# Patient Record
Sex: Female | Born: 1965 | Race: Black or African American | Hispanic: No | Smoking: Never smoker
Health system: Southern US, Community
[De-identification: ages and names within clinical notes are randomized; demographics above are authoritative.]

## PROBLEM LIST (undated history)

## (undated) DIAGNOSIS — D649 Anemia, unspecified: Secondary | ICD-10-CM

## (undated) DIAGNOSIS — IMO0002 Reserved for concepts with insufficient information to code with codable children: Secondary | ICD-10-CM

## (undated) DIAGNOSIS — J45909 Unspecified asthma, uncomplicated: Secondary | ICD-10-CM

## (undated) DIAGNOSIS — M069 Rheumatoid arthritis, unspecified: Secondary | ICD-10-CM

## (undated) DIAGNOSIS — T7840XA Allergy, unspecified, initial encounter: Secondary | ICD-10-CM

## (undated) DIAGNOSIS — M329 Systemic lupus erythematosus, unspecified: Secondary | ICD-10-CM

## (undated) DIAGNOSIS — G500 Trigeminal neuralgia: Secondary | ICD-10-CM

## (undated) DIAGNOSIS — Z5189 Encounter for other specified aftercare: Secondary | ICD-10-CM

## (undated) DIAGNOSIS — I219 Acute myocardial infarction, unspecified: Secondary | ICD-10-CM

## (undated) DIAGNOSIS — I82409 Acute embolism and thrombosis of unspecified deep veins of unspecified lower extremity: Secondary | ICD-10-CM

## (undated) HISTORY — DX: Encounter for other specified aftercare: Z51.89

## (undated) HISTORY — DX: Trigeminal neuralgia: G50.0

## (undated) HISTORY — DX: Allergy, unspecified, initial encounter: T78.40XA

## (undated) HISTORY — DX: Rheumatoid arthritis, unspecified: M06.9

## (undated) HISTORY — PX: ROTATOR CUFF REPAIR: SHX139

## (undated) HISTORY — PX: STOMACH SURGERY: SHX791

## (undated) HISTORY — DX: Anemia, unspecified: D64.9

## (undated) HISTORY — PX: NERVE AND TENDON REPAIR: SHX5693

## (undated) HISTORY — PX: COLONOSCOPY: SHX174

## (undated) HISTORY — DX: Acute myocardial infarction, unspecified: I21.9

## (undated) HISTORY — DX: Unspecified asthma, uncomplicated: J45.909

## (undated) HISTORY — DX: Systemic lupus erythematosus, unspecified: M32.9

## (undated) HISTORY — PX: ABDOMINAL HYSTERECTOMY: SHX81

## (undated) HISTORY — DX: Reserved for concepts with insufficient information to code with codable children: IMO0002

---

## 2013-06-24 ENCOUNTER — Ambulatory Visit: Payer: 59 | Admitting: Internal Medicine

## 2013-06-24 VITALS — BP 112/72 | HR 77 | Temp 98.9°F | Resp 18 | Ht 59.5 in | Wt 195.2 lb

## 2013-06-24 DIAGNOSIS — N760 Acute vaginitis: Secondary | ICD-10-CM

## 2013-06-24 DIAGNOSIS — M329 Systemic lupus erythematosus, unspecified: Secondary | ICD-10-CM | POA: Insufficient documentation

## 2013-06-24 LAB — POCT WET PREP WITH KOH
KOH Prep POC: POSITIVE
Trichomonas, UA: NEGATIVE
Yeast Wet Prep HPF POC: POSITIVE

## 2013-06-24 MED ORDER — FLUCONAZOLE 150 MG PO TABS: 150.0000 mg | ORAL_TABLET | Freq: Once | ORAL | Status: DC

## 2013-06-24 NOTE — Progress Notes (Addendum)
°  Subjective:    Patient ID: Brittney Maddox, female    DOB: 02-27-1966, 47 y.o.   MRN: [id number]  HPI  HPI Comments: Brittney Maddox is a 47 y.o. female who presents to the [facility] complaining of a suspected yeast infection onset two weeks ago with associated itching. Pt was recently prescribed Plaquenil for a cold with associated congestion. Pt states she has done a lot of scratching of the vaginal area. Pt denies being exposed to any new sexual partners. Pt has a hx of lupus. Just moved here. Needs rheum.   Review of Systems Non contrib    Objective:   Physical Exam  Genitourinary: Vaginal discharge found.  irrit labia//yellow d/c   BP 112/72   Pulse 77   Temp(Src) 98.9 F (37.2 C) (Oral)   Resp 18   Ht 4' 11.5" (1.511 m)   Wt 195 lb 3.2 oz (88.542 kg)   BMI 38.78 kg/m2   SpO2 99%        Results for orders placed in visit on 06/24/13  POCT WET PREP WITH KOH      Result Value Range   Trichomonas, UA Negative     Clue Cells Wet Prep HPF POC neg     Epithelial Wet Prep HPF POC 4-12     Yeast Wet Prep HPF POC positive     Bacteria Wet Prep HPF POC 3+     RBC Wet Prep HPF POC neg     WBC Wet Prep HPF POC TNTC     KOH Prep POC Positive      Assessment & Plan:  Vaginitis and vulvovaginitis - Meds ordered this encounter  Medications   fluconazole (DIFLUCAN) 150 MG tablet    Sig: Take 1 tablet (150 mg total) by mouth once. Once a week for 3 weeks    Dispense:  3 tablet    Refill:  0   Ref to Rheum  I have reviewed and agree with documentation. Robert P. Merla Riches, M.D. I have completed the patient encounter in its entirety as documented by the scribe, with editing by me where necessary. Robert P. Merla Riches, M.D.

## 2013-06-24 NOTE — Patient Instructions (Signed)
Rheumatology: Drs Dareen Piano and Dierdre Forth [phone]

## 2013-07-12 ENCOUNTER — Encounter: Payer: Self-pay | Admitting: Internal Medicine

## 2013-07-12 ENCOUNTER — Ambulatory Visit: Payer: Medicaid Other | Attending: Internal Medicine | Admitting: Internal Medicine

## 2013-07-12 VITALS — BP 120/80 | HR 82 | Temp 98.8°F | Resp 16 | Ht <= 58 in | Wt 197.0 lb

## 2013-07-12 DIAGNOSIS — E882 Lipomatosis, not elsewhere classified: Secondary | ICD-10-CM

## 2013-07-12 DIAGNOSIS — Z23 Encounter for immunization: Secondary | ICD-10-CM

## 2013-07-12 DIAGNOSIS — M329 Systemic lupus erythematosus, unspecified: Secondary | ICD-10-CM

## 2013-07-12 DIAGNOSIS — E7889 Other lipoprotein metabolism disorders: Secondary | ICD-10-CM

## 2013-07-12 DIAGNOSIS — N760 Acute vaginitis: Secondary | ICD-10-CM | POA: Insufficient documentation

## 2013-07-12 LAB — CMP AND LIVER
ALT: 65 U/L — ABNORMAL HIGH (ref 0–35)
AST: 49 U/L — ABNORMAL HIGH (ref 0–37)
Albumin: 4.1 g/dL (ref 3.5–5.2)
Alkaline Phosphatase: 137 U/L — ABNORMAL HIGH (ref 39–117)
BUN: 13 mg/dL (ref 6–23)
Calcium: 9.6 mg/dL (ref 8.4–10.5)
Chloride: 106 mEq/L (ref 96–112)
Potassium: 3.9 mEq/L (ref 3.5–5.3)
Sodium: 140 mEq/L (ref 135–145)
Total Protein: 7.4 g/dL (ref 6.0–8.3)

## 2013-07-12 LAB — LIPID PANEL
Cholesterol: 153 mg/dL (ref 0–200)
HDL: 65 mg/dL (ref >39)
LDL Cholesterol: 77 mg/dL (ref 0–99)
Total CHOL/HDL Ratio: 2.4 Ratio
VLDL: 11 mg/dL (ref 0–40)

## 2013-07-12 LAB — CBC WITH DIFFERENTIAL/PLATELET
Basophils Absolute: 0 10*3/uL (ref 0.0–0.1)
Basophils Relative: 1 % (ref 0–1)
HCT: 37.6 % (ref 36.0–46.0)
Lymphocytes Relative: 48 % — ABNORMAL HIGH (ref 12–46)
MCHC: 34.3 g/dL (ref 30.0–36.0)
Monocytes Absolute: 0.3 10*3/uL (ref 0.1–1.0)
Neutro Abs: 1.8 10*3/uL (ref 1.7–7.7)
Neutrophils Relative %: 42 % — ABNORMAL LOW (ref 43–77)
RDW: 14 % (ref 11.5–15.5)
WBC: 4.1 10*3/uL (ref 4.0–10.5)

## 2013-07-12 MED ORDER — NAPROXEN 500 MG PO TABS: 500.0000 mg | ORAL_TABLET | Freq: Two times a day (BID) | ORAL | Status: DC

## 2013-07-12 NOTE — Patient Instructions (Signed)
Lupus Lupus (also called systemic lupus erythematosus, SLE) is a disorder of the body's natural defense system (immune system). In lupus, the immune system attacks various areas of the body (autoimmune disease). CAUSES The cause is unknown. However, lupus runs in families. Certain genes can make you more likely to develop lupus. It is 10 times more common in women than in men. Lupus is also more common in African Americans and Asians. Other factors also play a role, such as viruses (Epstein-Barr virus, EBV), stress, hormones, cigarette smoke, and certain drugs. SYMPTOMS Lupus can affect many parts of the body, including the joints, skin, kidneys, lungs, heart, nervous system, and blood vessels. The signs and symptoms of lupus differ from person to person. The disease can range from mild to life-threatening. Typical features of lupus include:  Butterfly-shaped rash over the face.  Arthritis involving one or more joints.  Kidney disease.  Fever, weight loss, hair loss, fatigue.  Poor circulation in the fingers and toes (Raynaud's disease).  Chest pain when taking deep breaths. Abdominal pain may also occur.  Skin rash in areas exposed to the sun.  Sores in the mouth and nose. DIAGNOSIS Diagnosing lupus can take a long time and is often difficult. An exam and an accurate account of your symptoms and health problems is very important. Blood tests are necessary, though no single test can confirm or rule out lupus. Most people with lupus test positive for antinuclear antibodies (ANA) on a blood test. Additional blood tests, a urine test (urinalysis), and sometimes a kidney or skin tissue sample (biopsy) can help to confirm or rule out lupus. TREATMENT There is no cure for lupus. Your caregiver will develop a treatment plan based on your age, sex, health, symptoms, and lifestyle. The goals are to prevent flares, to treat them when they do occur, and to minimize organ damage and complications. How  the disease may affect each person varies widely. Most people with lupus can live normal lives, but this disorder must be carefully monitored. Treatment must be adjusted as necessary to prevent serious complications. Medicines used for treatment:  Nonsteroidal anti-inflammatory drugs (NSAIDs) decrease inflammation and can help with chest pain, joint pain, and fevers. Examples include ibuprofen and naproxen.  Antimalarial drugs were designed to treat malaria. They also treat fatigue, joint pain, skin rashes, and inflammation of the lungs in patients with lupus.  Corticosteroids are powerful hormones that rapidly suppress inflammation. The lowest dose with the highest benefit will be chosen. They can be given by cream, pills, injections, and through the vein (intravenously).  Immunosuppressive drugs block the making of immune cells. They may be used for kidney or nerve disease. HOME CARE INSTRUCTIONS  Exercise. Low-impact activities can usually help keep joints flexible without being too strenuous.  Rest after periods of exercise.  Avoid excessive sun exposure.  Follow proper nutrition and take supplements as recommended by your caregiver.  Stress management can be helpful. SEEK MEDICAL CARE IF:  You have increased fatigue.  You develop pain.  You develop a rash.  You have an oral temperature above 102 F (38.9 C).  You develop abdominal discomfort.  You develop a headache.  You experience dizziness. FOR MORE INFORMATION National Institute of Neurological Disorders and Stroke: www.ninds.nih.gov American College of Rheumatology: www.rheumatology.org National Institute of Arthritis and Musculoskeletal and Skin Diseases: www.niams.nih.gov Document Released: 08/19/2002 Document Revised: 11/21/2011 Document Reviewed: 12/10/2009 [employer] Patient Information 2014 [employer].  

## 2013-07-12 NOTE — Progress Notes (Signed)
Patient ID: Brittney Maddox, female   DOB: [birth year], 47 y.o.   MRN: [id number] Patient Demographics  Brittney Maddox, is a 47 y.o. female  KGM:[id number]  [id number]:[id number]  DOB - Feb 01, 1966  CC:  Chief Complaint  Patient presents with  . Establish Care       HPI: [name] is a 47 y.o. female here today to establish medical care. Patient has history of asthma and lupus, she is on Plaquenil for lupus. She was recently treated for sore throat with amoxicillin, since then she has been having vaginal discharge and itching, she just completed 3 doses of Diflucan, units symptoms persist. Today she has no other complaint, her glucoses in remission per patient, she is no more on steroid though she took it for years. She stopped because of excessive weight gain. She is not known to have hypertension or diabetes. Her only medication is hydrochlorothiazide and albuterol inhaler, and ibuprofen as needed. No leg swelling or joint swelling or pain today. She is here because she needs a referral to rheumatologist for ongoing care. She does want a flu shot, and preventive care. She does not smoke cigarette, she does not drink alcohol. She had taken excessive Tylenol in the past for pain. She had to stop because of persistent elevated liver enzymes.  Patient has No headache, No chest pain, No abdominal pain - No Nausea, No new weakness tingling or numbness, No Cough - SOB.  No Known Allergies Past Medical History  Diagnosis Date  . Allergy   . Asthma   . Myocardial infarction    Current Outpatient Prescriptions on File Prior to Visit  Medication Sig Dispense Refill  . albuterol (PROVENTIL) (2.5 MG/3ML) 0.083% nebulizer solution Take 2.5 mg by nebulization every 6 (six) hours as needed for wheezing.      . hydroxychloroquine (PLAQUENIL) 200 MG tablet Take 200 mg by mouth 2 (two) times daily.      . fluconazole (DIFLUCAN) 150 MG tablet Take 1 tablet (150 mg total) by mouth once. Once a week for 3 weeks  3 tablet   0  . predniSONE (DELTASONE) 10 MG tablet Take 10 mg by mouth daily.       No current facility-administered medications on file prior to visit.   History reviewed. No pertinent family history. History   Social History  . Marital Status: Single    Spouse Name: N/A    Number of Children: N/A  . Years of Education: N/A   Occupational History  . Not on file.   Social History Main Topics  . Smoking status: Never Smoker   . Smokeless tobacco: Never Used  . Alcohol Use: No  . Drug Use: No  . Sexual Activity: Not on file   Other Topics Concern  . Not on file   Social History Narrative  . No narrative on file    Review of Systems: Constitutional: Negative for fever, chills, diaphoresis, activity change, appetite change and fatigue. HENT: Negative for ear pain, nosebleeds, congestion, facial swelling, rhinorrhea, neck pain, neck stiffness and ear discharge.  Eyes: Negative for pain, discharge, redness, itching and visual disturbance. Respiratory: Negative for cough, choking, chest tightness, shortness of breath, wheezing and stridor.  Cardiovascular: Negative for chest pain, palpitations and leg swelling. Gastrointestinal: Negative for abdominal distention. Genitourinary: Negative for dysuria, urgency, frequency, hematuria, flank pain, decreased urine volume, difficulty urinating and dyspareunia.  Musculoskeletal: Negative for back pain, joint swelling, arthralgia and gait problem. Neurological: Negative for dizziness, tremors, seizures, syncope, facial asymmetry,  speech difficulty, weakness, light-headedness, numbness and headaches.  Hematological: Negative for adenopathy. Does not bruise/bleed easily. Psychiatric/Behavioral: Negative for hallucinations, behavioral problems, confusion, dysphoric mood, decreased concentration and agitation.    Objective:   Filed Vitals:   07/12/13 1445  BP: 120/80  Pulse: 82  Temp: 98.8 F (37.1 C)  Resp: 16    Physical  Exam: Constitutional: Patient appears well-developed and well-nourished. No distress. HENT: Normocephalic, atraumatic, External right and left ear normal. Oropharynx is clear and moist.  Eyes: Conjunctivae and EOM are normal. PERRLA, no scleral icterus. Neck: Normal ROM. Neck supple. No JVD. No tracheal deviation. No thyromegaly. CVS: RRR, S1/S2 +, no murmurs, no gallops, no carotid bruit.  Pulmonary: Effort and breath sounds normal, no stridor, rhonchi, wheezes, rales.  Abdominal: Soft. BS +, no distension, tenderness, rebound or guarding.  Musculoskeletal: Lumps on both shins measuring about 15 x 20 cm, soft, attached to the skin, non-tender. Normal range of motion. No edema and no tenderness.  Lymphadenopathy: No lymphadenopathy noted, cervical, inguinal or axillary Neuro: Alert. Normal reflexes, muscle tone coordination. No cranial nerve deficit. Skin: Skin is warm and dry. No rash noted. Not diaphoretic. No erythema. No pallor. Psychiatric: Normal mood and affect. Behavior, judgment, thought content normal.  No results found for this basename: WBC, HGB, HCT, MCV, PLT   No results found for this basename: CREATININE, BUN, NA, K, CL, CO2    No results found for this basename: HGBA1C   Lipid Panel  No results found for this basename: chol, trig, hdl, cholhdl, vldl, ldlcalc       Assessment and plan:   Patient Active Problem List   Diagnosis Date Noted  . Vaginitis and vulvovaginitis 07/12/2013  . 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 Lupus (systemic lupus erythematosus) 06/24/2013    Plan: Comprehensive metabolic panel Complete blood count and differentials Lipid panel Complete urinalysis Thyroid function test Hemoglobin A1c Vitamin D level  For pain: Naproxen 500 mg tablet by mouth twice a day  Patient has been extensively counseled about nutrition and exercise Patient has been counseled about compliance with medications and followup Patient was educated on how diagnosis       Health Maintenance -Colonoscopy: -Pap Smear: Ambulatory referral to gynecologist for Pap smear -Mammogram: Ordered -Vaccinations:  -Influenza given today  Follow up in 3 months or when necessary  The patient was given clear instructions to go to ER or return to medical center if symptoms don't improve, worsen or new problems develop. The patient verbalized understanding. The patient was told to call to get lab results if they haven't heard anything in the next week.     Jeanann Lewandowsky, MD, MHA, FACP, FAAP [facility] [place], [place] [phone]   07/12/2013, 3:30 PM

## 2013-07-12 NOTE — Progress Notes (Signed)
Pt is here to establish care. Pt has been diagnosed with asthma and lupus. Pt has constant headaches. For 4 months pt has had edema and lipoma in her calf's

## 2013-07-13 LAB — URINALYSIS, COMPLETE
Bilirubin Urine: NEGATIVE
Crystals: NONE SEEN
Glucose, UA: NEGATIVE mg/dL
Ketones, ur: NEGATIVE mg/dL
Protein, ur: NEGATIVE mg/dL
WBC, UA: >50 WBC/hpf — AB (ref <3)

## 2013-07-13 LAB — HEMOGLOBIN A1C
Hgb A1c MFr Bld: 5.9 % — ABNORMAL HIGH (ref <5.7)
Mean Plasma Glucose: 123 mg/dL — ABNORMAL HIGH (ref <117)

## 2013-07-13 LAB — THYROID PANEL WITH TSH
Free Thyroxine Index: 3.3 (ref 1.0–3.9)
T3 Uptake: 31.6 % (ref 22.5–37.0)

## 2013-07-31 ENCOUNTER — Telehealth: Payer: Self-pay | Admitting: General Practice

## 2013-07-31 ENCOUNTER — Telehealth: Payer: Self-pay

## 2013-07-31 NOTE — Telephone Encounter (Signed)
Patient called  For results  Sent message to provider to review and advise

## 2013-07-31 NOTE — Telephone Encounter (Signed)
Please review labs and advise Patient called wanting her results

## 2013-07-31 NOTE — Telephone Encounter (Signed)
Patient would like her lab results

## 2013-08-20 ENCOUNTER — Ambulatory Visit (INDEPENDENT_AMBULATORY_CARE_PROVIDER_SITE_OTHER): Payer: Medicare HMO | Admitting: Family Medicine

## 2013-08-20 VITALS — BP 100/75 | HR 81 | Temp 98.0°F | Resp 18 | Wt 208.0 lb

## 2013-08-20 DIAGNOSIS — N760 Acute vaginitis: Secondary | ICD-10-CM

## 2013-08-20 DIAGNOSIS — A5901 Trichomonal vulvovaginitis: Secondary | ICD-10-CM

## 2013-08-20 DIAGNOSIS — R7303 Prediabetes: Secondary | ICD-10-CM | POA: Insufficient documentation

## 2013-08-20 DIAGNOSIS — R7989 Other specified abnormal findings of blood chemistry: Secondary | ICD-10-CM

## 2013-08-20 DIAGNOSIS — M329 Systemic lupus erythematosus, unspecified: Secondary | ICD-10-CM

## 2013-08-20 DIAGNOSIS — K7689 Other specified diseases of liver: Secondary | ICD-10-CM

## 2013-08-20 DIAGNOSIS — R7309 Other abnormal glucose: Secondary | ICD-10-CM

## 2013-08-20 DIAGNOSIS — B9689 Other specified bacterial agents as the cause of diseases classified elsewhere: Secondary | ICD-10-CM

## 2013-08-20 DIAGNOSIS — K760 Fatty (change of) liver, not elsewhere classified: Secondary | ICD-10-CM | POA: Insufficient documentation

## 2013-08-20 LAB — POCT UA - MICROSCOPIC ONLY
Mucus, UA: POSITIVE
Yeast, UA: NEGATIVE

## 2013-08-20 LAB — POCT URINALYSIS DIPSTICK
Bilirubin, UA: NEGATIVE
Blood, UA: NEGATIVE
Glucose, UA: NEGATIVE
Ketones, UA: NEGATIVE
Protein, UA: NEGATIVE
Spec Grav, UA: 1.02
Urobilinogen, UA: 0.2

## 2013-08-20 LAB — POCT WET PREP WITH KOH
Epithelial Wet Prep HPF POC: NEGATIVE
KOH Prep POC: NEGATIVE
Trichomonas, UA: POSITIVE
Yeast Wet Prep HPF POC: NEGATIVE

## 2013-08-20 MED ORDER — METRONIDAZOLE 500 MG PO TABS: 500.0000 mg | ORAL_TABLET | Freq: Two times a day (BID) | ORAL | Status: DC

## 2013-08-20 NOTE — Patient Instructions (Addendum)
For recurrent vaginitis you were referred to [facility] gynecology by your primary care doctor at [facility]. For your lupus you were referred to Dr. Nickola Major.  You had an appt on 08-12-13 @ 8:30am at [facility] with Dr Nickola Major.  You should call to reschedule the appointment since you were unaware of it.  Trichomoniasis Trichomoniasis is an infection, caused by the Trichomonas organism, that affects both women and men. In women, the outer female genitalia and the vagina are affected. In men, the penis is mainly affected, but the prostate and other reproductive organs can also be involved. Trichomoniasis is a sexually transmitted disease (STD) and is most often passed to another person through sexual contact. The majority of people who get trichomoniasis do so from a sexual encounter and are also at risk for other STDs. CAUSES   Sexual intercourse with an infected partner.  It can be present in swimming pools or hot tubs. SYMPTOMS   Abnormal gray-green frothy vaginal discharge in women.  Vaginal itching and irritation in women.  Itching and irritation of the area outside the vagina in women.  Penile discharge with or without pain in males.  Inflammation of the urethra (urethritis), causing painful urination.  Bleeding after sexual intercourse. RELATED COMPLICATIONS  Pelvic inflammatory disease.  Infection of the uterus (endometritis).  Infertility.  Tubal (ectopic) pregnancy.  It can be associated with other STDs, including gonorrhea and chlamydia, hepatitis B, and HIV. COMPLICATIONS DURING PREGNANCY  Early (premature) delivery.  Premature rupture of the membranes (PROM).  Low birth weight. DIAGNOSIS   Visualization of Trichomonas under the microscope from the vagina discharge.  Ph of the vagina greater than 4.5, tested with a test tape.  Trich Rapid Test.  Culture of the organism, but this is not usually needed.  It may  be found on a Pap test.  Having a "strawberry cervix,"which means the cervix looks very red like a strawberry. TREATMENT   You may be given medication to fight the infection. Inform your caregiver if you could be or are pregnant. Some medications used to treat the infection should not be taken during pregnancy.  Over-the-counter medications or creams to decrease itching or irritation may be recommended.  Your sexual partner will need to be treated if infected. HOME CARE INSTRUCTIONS   Take all medication prescribed by your caregiver.  Take over-the-counter medication for itching or irritation as directed by your caregiver.  Do not have sexual intercourse while you have the infection.  Do not douche or wear tampons.  Discuss your infection with your partner, as your partner may have acquired the infection from you. Or, your partner may have been the person who transmitted the infection to you.  Have your sex partner examined and treated if necessary.  Practice safe, informed, and protected sex.  See your caregiver for other STD testing. SEEK MEDICAL CARE IF:   You still have symptoms after you finish the medication.  You have an oral temperature above 102 F (38.9 C).  You develop belly (abdominal) pain.  You have pain when you urinate.  You have bleeding after sexual intercourse.  You develop a rash.  The medication makes you sick or makes you throw up (vomit). Document Released: 02/22/2001 Document Revised: 11/21/2011 Document Reviewed: 03/20/2009 [facility] Patient Information 2014 [place], [place].  Bacterial Vaginosis Bacterial vaginosis (BV) is a vaginal infection where the normal balance of bacteria in the vagina is disrupted. The normal balance is then replaced by an  overgrowth of certain bacteria. There are several different kinds of bacteria that can cause BV. BV is the most common vaginal infection in women of childbearing age. CAUSES   The cause of BV is not  fully understood. BV develops when there is an increase or imbalance of harmful bacteria.  Some activities or behaviors can upset the normal balance of bacteria in the vagina and put women at increased risk including:  Having a new sex partner or multiple sex partners.  Douching.  Using an intrauterine device (IUD) for contraception.  It is not clear what role sexual activity plays in the development of BV. However, women that have never had sexual intercourse are rarely infected with BV. Women do not get BV from toilet seats, bedding, swimming pools or from touching objects around them.  SYMPTOMS   Grey vaginal discharge.  A fish-like odor with discharge, especially after sexual intercourse.  Itching or burning of the vagina and vulva.  Burning or pain with urination.  Some women have no signs or symptoms at all. DIAGNOSIS  Your caregiver must examine the vagina for signs of BV. Your caregiver will perform lab tests and look at the sample of vaginal fluid through a microscope. They will look for bacteria and abnormal cells (clue cells), a pH test higher than 4.5, and a positive amine test all associated with BV.  RISKS AND COMPLICATIONS   Pelvic inflammatory disease (PID).  Infections following gynecology surgery.  Developing HIV.  Developing herpes virus. TREATMENT  Sometimes BV will clear up without treatment. However, all women with symptoms of BV should be treated to avoid complications, especially if gynecology surgery is planned. Female partners generally do not need to be treated. However, BV may spread between female sex partners so treatment is helpful in preventing a recurrence of BV.   BV may be treated with antibiotics. The antibiotics come in either pill or vaginal cream forms. Either can be used with nonpregnant or pregnant women, but the recommended dosages differ. These antibiotics are not harmful to the baby.  BV can recur after treatment. If this happens, a  second round of antibiotics will often be prescribed.  Treatment is important for pregnant women. If not treated, BV can cause a premature delivery, especially for a pregnant woman who had a premature birth in the past. All pregnant women who have symptoms of BV should be checked and treated.  For chronic reoccurrence of BV, treatment with a type of prescribed gel vaginally twice a week is helpful. HOME CARE INSTRUCTIONS   Finish all medication as directed by your caregiver.  Do not have sex until treatment is completed.  Tell your sexual partner that you have a vaginal infection. They should see their caregiver and be treated if they have problems, such as a mild rash or itching.  Practice safe sex. Use condoms. Only have 1 sex partner. PREVENTION  Basic prevention steps can help reduce the risk of upsetting the natural balance of bacteria in the vagina and developing BV:  Do not have sexual intercourse (be abstinent).  Do not douche.  Use all of the medicine prescribed for treatment of BV, even if the signs and symptoms go away.  Tell your sex partner if you have BV. That way, they can be treated, if needed, to prevent reoccurrence. SEEK MEDICAL CARE IF:   Your symptoms are not improving after 3 days of treatment.  You have increased discharge, pain, or fever. MAKE SURE YOU:   Understand these instructions.  Will watch your condition.  Will get help right away if you are not doing well or get worse. FOR MORE INFORMATION  Division of STD Prevention (DSTDP), Centers for Disease Control and Prevention: [employer].za American Social Health Association (ASHA): www.ashastd.org  Document Released: 08/29/2005 Document Revised: 11/21/2011 Document Reviewed: 04/10/2013 [facility] Patient Information 2014 [place], [place].

## 2013-08-20 NOTE — Progress Notes (Signed)
Subjective:    Patient ID: Brittney Maddox, female    DOB: [birth year], 47 y.o.   MRN: [id number] This chart was scribed for Sherren Mocha, MD by Valera Castle, ED Scribe. This patient was seen in room 13 and the patient's care was started at 10:27 AM.  Chief Complaint  Patient presents with  . Vaginitis  . Dizziness  . Leg Swelling    left leg    HPI Brittney Maddox is a 47 y.o. female with h/o Lupus and yest vaginitis who presents to the [facility] complaining of vaginitis and bilateral leg swelling.   Pt is on immuno-suppressants for h/o Lupus. She reports she has been on Prednisone since 2000. Moved here recently from [place]. PCP - [facility]. She was seen there by Dr. Hyman Hopes once in 06/2013 complaining of 4 months of swelling and pain in bilateral calf. She was referred to rheumatology, Dr. Nickola Major at [facility] for further evaluation and management of her lupus. However, she did not keep her rheumatologist appointment as pt was unaware of it. Pt's phone has been down and reports that she has recently moved and has not updated her address.   Pt was also recently diagnosed with pre-DM and slight liver irritation on last labs. She reports having US done of her liver in [place] which showed fatty liver.  She was seen here by Dr. Merla Riches earlier 06/2013 and was given Diflucan for yeast infection. She reports the Diflucan only gave her temp relief and she has had the current infection intermittently for the past 4 months. She reports trying over counter drugs including Monistat, without relief. She states she thinks it is a yeast infection due to vaginal itching and discharge that is clumpy. She reports her urine is cloudy. She denies any other urinary symptoms. She denies pelvic pain. She reports not being sexually active for 9 months. She denies needing STD testing today.  She reports having switched detergents and soaps to try to improve sxs. Not douching.  Her PCP referred her to gyn for  her pap and mammogram but pt states appt has not been scheduled.  She states she exercises regularly. She states she has been on raw diet, and has been eating well so she does not understand why she cannot loose weight and why all of this fat was collecting her in her legs.  Feels like her legs are swollen and she has been put on lasix for this in the past but did not help - still has it at home.    PCP - No PCP Per Patient  Patient Active Problem List   Diagnosis Date Noted  . Vaginitis and vulvovaginitis 07/12/2013  . 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 Lupus (systemic lupus erythematosus) 06/24/2013   Past Medical History  Diagnosis Date  . Allergy   . Asthma   . Myocardial infarction   . Lupus    Past Surgical History  Procedure Laterality Date  . Cesarean section    . Abdominal hysterectomy     No Known Allergies Prior to Admission medications   Medication Sig Start Date End Date Taking? Authorizing Provider  albuterol (PROVENTIL) (2.5 MG/3ML) 0.083% nebulizer solution Take 2.5 mg by nebulization every 6 (six) hours as needed for wheezing.   Yes Historical Provider, MD  hydroxychloroquine (PLAQUENIL) 200 MG tablet Take 200 mg by mouth 2 (two) times daily.   Yes Historical Provider, MD  predniSONE (DELTASONE) 10 MG tablet Take 10 mg by mouth  daily.   Yes Historical Provider, MD  fluconazole (DIFLUCAN) 150 MG tablet Take 1 tablet (150 mg total) by mouth once. Once a week for 3 weeks 06/24/13   Tonye Pearson, MD  naproxen (NAPROSYN) 500 MG tablet Take 1 tablet (500 mg total) by mouth 2 (two) times daily with a meal. 07/12/13   Jeanann Lewandowsky, MD   Family History  Problem Relation Age of Onset  . Heart disease Mother   . Cancer Father    History   Social History  . Marital Status: Single    Spouse Name: N/A    Number of Children: N/A  . Years of Education: N/A   Occupational History  . Not on file.   Social History Main Topics  . Smoking status: Never Smoker   .  Smokeless tobacco: Never Used  . Alcohol Use: No  . Drug Use: No  . Sexual Activity: Not on file   Other Topics Concern  . Not on file   Social History Narrative  . No narrative on file    Review of Systems  Constitutional: Positive for unexpected weight change. Negative for fever, chills, diaphoresis, activity change, appetite change and fatigue.  Cardiovascular: Positive for leg swelling.  Gastrointestinal: Negative for abdominal pain, diarrhea, constipation, blood in stool, anal bleeding and rectal pain.  Genitourinary: Positive for vaginal discharge. Negative for dysuria, urgency, frequency, hematuria, decreased urine volume, vaginal bleeding, difficulty urinating, genital sores, vaginal pain, menstrual problem, pelvic pain and dyspareunia.       Vaginal itching, cloudy urine  Musculoskeletal: Negative for gait problem.  Skin: Negative for rash.  Neurological: Positive for dizziness.  Hematological: Negative for adenopathy.  Psychiatric/Behavioral: The patient is not nervous/anxious.     BP 100/75  Pulse 81  Temp(Src) 98 F (36.7 C) (Oral)  Resp 18  Wt 208 lb (94.348 kg)  SpO2 97%    Objective:   Physical Exam  Nursing note and vitals reviewed. Constitutional: She is oriented to person, place, and time. She appears well-developed and well-nourished. No distress.  HENT:  Head: Normocephalic and atraumatic.  Eyes: EOM are normal.  Neck: Neck supple. No tracheal deviation present.  Cardiovascular: Normal rate, regular rhythm and normal heart sounds.  Exam reveals no gallop and no friction rub.   No murmur heard. Pulmonary/Chest: Effort normal and breath sounds normal. No respiratory distress. She has no wheezes. She has no rales.  Musculoskeletal: Normal range of motion.  Marked bilateral enlargement of legs. Very tender to palpation with trace pitting edema typical of lipedema. Feet spared.  Neurological: She is alert and oriented to person, place, and time.  Skin:  Skin is warm and dry.  Psychiatric: She has a normal mood and affect. Her behavior is normal.       Results for orders placed in visit on 08/20/13  URINE CULTURE      Result Value Range   Colony Count NO GROWTH     Organism ID, Bacteria NO GROWTH    POCT UA - MICROSCOPIC ONLY      Result Value Range   WBC, Ur, HPF, POC tntc     RBC, urine, microscopic 3-6     Bacteria, U Microscopic 1+     Mucus, UA pos     Epithelial cells, urine per micros 0-3     Crystals, Ur, HPF, POC neg     Casts, Ur, LPF, POC neg     Yeast, UA neg     Trichomonas, UA pos  POCT URINALYSIS DIPSTICK      Result Value Range   Color, UA yellow     Clarity, UA clear     Glucose, UA neg     Bilirubin, UA neg     Ketones, UA neg     Spec Grav, UA 1.020     Blood, UA neg     pH, UA 5.0     Protein, UA neg     Urobilinogen, UA 0.2     Nitrite, UA neg     Leukocytes, UA small (1+)    POCT WET PREP WITH KOH      Result Value Range   Trichomonas, UA Positive     Clue Cells Wet Prep HPF POC tntc     Epithelial Wet Prep HPF POC neg     Yeast Wet Prep HPF POC neg     Bacteria Wet Prep HPF POC 4+     RBC Wet Prep HPF POC 6-14     WBC Wet Prep HPF POC tntc     KOH Prep POC Negative      Assessment & Plan:  Lupus (systemic lupus erythematosus) - Plan: POCT UA - Microscopic Only, POCT urinalysis dipstick, POCT Wet Prep with KOH, Ambulatory referral to Physical Therapy.  Pt was sched to see Dr. Nickola Major but was unaware of the appt - she moved and did not get the letter re the appt. Pt encouraged to reschedule as I think she needs to see if there are any other alternatives for her than the prednisone - pt repeatedly warned not just to stop the prednisone as she has been on it for 14 yrs but likely the cause of many of her other complaints.  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 I do think getting of the prednisone is likely  to help most but will also try PT referral as very painful.  Elevated LFTs - rec recheck w/ next labs, mild.  Pt states known h/o fatty liver confirmed by [place].  Prediabetes - Plan: POCT UA - Microscopic Only, POCT urinalysis dipstick, POCT Wet Prep with KOH. New diagnosis - likely due to prednisone - hopefully pt can get off or be weaned down soon. Recheck in 2 mos. Pt is already working on diet and exercise though has not had any education on DM diet yet.  Fatty liver  Morbid obesity - pt doing very well w/ stringent diet and exercise but battling prednisone.  Vaginitis and vulvovaginitis - Plan: Urine culture - Pt has recurrent complaints - likely due to prednisone use. Prior yeast seen once, now BV and trich today. Pt needs wet prep whenever sxs recur so we can see if there is any pattern as she may benefit from regular or prophylatic treatment of there is a consistent diagnosis. Pt was referred to gyn by her PCP but appt not yet sched - pt given phone # to [facility] - needs pap and mammogram per pt.  Trichomonal vaginitis - not seen on wet prep in Oct but no sexual activity since then. Pt engaged and fiance works out of town - has not seen him in several mos so transmission unclear but pt encouraged to have fiance treated prior to resuming sexual activity  Bacterial vaginosis  Meds ordered this encounter  Medications  . metroNIDAZOLE (FLAGYL) 500 MG tablet    Sig: Take  1 tablet (500 mg total) by mouth 2 (two) times daily with a meal. DO NOT CONSUME ALCOHOL WHILE TAKING THIS MEDICATION.    Dispense:  14 tablet    Refill:  0    I personally performed the services described in this documentation, which was scribed in my presence. The recorded information has been reviewed and considered, and addended by me as needed.  Norberto Sorenson, MD MPH

## 2013-08-21 LAB — URINE CULTURE: Organism ID, Bacteria: NO GROWTH

## 2013-08-26 ENCOUNTER — Other Ambulatory Visit: Payer: Self-pay

## 2013-08-26 ENCOUNTER — Encounter (HOSPITAL_COMMUNITY): Payer: Self-pay | Admitting: Emergency Medicine

## 2013-08-26 ENCOUNTER — Emergency Department (HOSPITAL_COMMUNITY)
Admission: EM | Admit: 2013-08-26 | Discharge: 2013-08-27 | Discharge status: Against Medical Advice | Payer: Medicaid Other | Attending: Emergency Medicine | Admitting: Emergency Medicine

## 2013-08-26 DIAGNOSIS — I252 Old myocardial infarction: Secondary | ICD-10-CM | POA: Insufficient documentation

## 2013-08-26 DIAGNOSIS — R079 Chest pain, unspecified: Secondary | ICD-10-CM | POA: Insufficient documentation

## 2013-08-26 DIAGNOSIS — M79609 Pain in unspecified limb: Secondary | ICD-10-CM | POA: Insufficient documentation

## 2013-08-26 DIAGNOSIS — J45909 Unspecified asthma, uncomplicated: Secondary | ICD-10-CM | POA: Insufficient documentation

## 2013-08-26 LAB — CBC
HCT: 38.3 % (ref 36.0–46.0)
Hemoglobin: 12.5 g/dL (ref 12.0–15.0)
MCHC: 32.6 g/dL (ref 30.0–36.0)
RDW: 13.7 % (ref 11.5–15.5)

## 2013-08-26 LAB — BASIC METABOLIC PANEL
BUN: 12 mg/dL (ref 6–23)
CO2: 24 mEq/L (ref 19–32)
Calcium: 8.9 mg/dL (ref 8.4–10.5)
Chloride: 107 mEq/L (ref 96–112)
Creatinine, Ser: 0.8 mg/dL (ref 0.50–1.10)
GFR calc Af Amer: >90 mL/min (ref >90)
GFR calc non Af Amer: 85 mL/min — ABNORMAL LOW (ref >90)
Glucose, Bld: 97 mg/dL (ref 70–99)
Potassium: 4 mEq/L (ref 3.5–5.1)
Sodium: 140 mEq/L (ref 135–145)

## 2013-08-26 LAB — POCT I-STAT TROPONIN I: Troponin i, poc: 0 ng/mL (ref 0.00–0.08)

## 2013-08-26 NOTE — ED Notes (Signed)
Patient presents with c/o pain to her left upper leg groin area and behind her left knee.  Also c/o centralized chest pain that is like pressure

## 2013-08-26 NOTE — ED Notes (Signed)
Called for room, no answer in lobby  

## 2013-08-27 ENCOUNTER — Encounter: Payer: Self-pay | Admitting: Obstetrics & Gynecology

## 2013-08-27 NOTE — ED Notes (Signed)
Called for room, no answer in lobby  

## 2013-08-27 NOTE — ED Notes (Signed)
Called for room, no answer

## 2013-10-08 ENCOUNTER — Ambulatory Visit: Payer: Medicaid Other | Admitting: Physical Therapy

## 2013-10-11 ENCOUNTER — Encounter: Payer: Medicaid Other | Admitting: Obstetrics & Gynecology

## 2013-10-11 ENCOUNTER — Ambulatory Visit: Payer: Self-pay | Admitting: Internal Medicine

## 2013-10-14 ENCOUNTER — Ambulatory Visit: Payer: Medicare HMO | Attending: Family Medicine | Admitting: Physical Therapy

## 2013-10-14 DIAGNOSIS — M329 Systemic lupus erythematosus, unspecified: Secondary | ICD-10-CM | POA: Insufficient documentation

## 2013-10-14 DIAGNOSIS — I890 Lymphedema, not elsewhere classified: Secondary | ICD-10-CM | POA: Insufficient documentation

## 2013-10-14 DIAGNOSIS — IMO0001 Reserved for inherently not codable concepts without codable children: Secondary | ICD-10-CM | POA: Insufficient documentation

## 2013-11-04 ENCOUNTER — Ambulatory Visit: Payer: Medicare HMO

## 2013-11-06 ENCOUNTER — Ambulatory Visit: Payer: Medicare HMO

## 2013-11-08 ENCOUNTER — Ambulatory Visit: Payer: Medicare HMO | Admitting: Physical Therapy

## 2013-11-11 ENCOUNTER — Ambulatory Visit: Payer: Medicare HMO | Admitting: Physical Therapy

## 2013-11-12 ENCOUNTER — Ambulatory Visit: Payer: Medicare HMO | Attending: Family Medicine | Admitting: Physical Therapy

## 2013-11-12 DIAGNOSIS — IMO0001 Reserved for inherently not codable concepts without codable children: Secondary | ICD-10-CM | POA: Diagnosis present

## 2013-11-12 DIAGNOSIS — I890 Lymphedema, not elsewhere classified: Secondary | ICD-10-CM | POA: Diagnosis not present

## 2013-11-12 DIAGNOSIS — M329 Systemic lupus erythematosus, unspecified: Secondary | ICD-10-CM | POA: Diagnosis not present

## 2013-11-15 ENCOUNTER — Ambulatory Visit: Payer: Medicare HMO | Admitting: Physical Therapy

## 2013-11-15 DIAGNOSIS — IMO0001 Reserved for inherently not codable concepts without codable children: Secondary | ICD-10-CM | POA: Diagnosis not present

## 2013-11-18 ENCOUNTER — Ambulatory Visit: Payer: Medicare HMO | Admitting: Physical Therapy

## 2013-11-20 ENCOUNTER — Ambulatory Visit: Payer: Medicare HMO

## 2013-11-20 DIAGNOSIS — IMO0001 Reserved for inherently not codable concepts without codable children: Secondary | ICD-10-CM | POA: Diagnosis not present

## 2013-11-22 ENCOUNTER — Ambulatory Visit: Payer: Medicare HMO | Admitting: Physical Therapy

## 2013-11-22 DIAGNOSIS — IMO0001 Reserved for inherently not codable concepts without codable children: Secondary | ICD-10-CM | POA: Diagnosis not present

## 2013-11-29 ENCOUNTER — Ambulatory Visit: Payer: Medicare HMO | Admitting: Physical Therapy

## 2013-11-29 DIAGNOSIS — IMO0001 Reserved for inherently not codable concepts without codable children: Secondary | ICD-10-CM | POA: Diagnosis not present

## 2013-12-04 ENCOUNTER — Ambulatory Visit: Payer: Medicare HMO

## 2013-12-04 DIAGNOSIS — IMO0001 Reserved for inherently not codable concepts without codable children: Secondary | ICD-10-CM | POA: Diagnosis not present

## 2013-12-06 ENCOUNTER — Ambulatory Visit: Payer: Medicare HMO | Admitting: Physical Therapy

## 2013-12-06 DIAGNOSIS — IMO0001 Reserved for inherently not codable concepts without codable children: Secondary | ICD-10-CM | POA: Diagnosis not present

## 2013-12-09 ENCOUNTER — Ambulatory Visit: Payer: Medicare HMO

## 2013-12-11 ENCOUNTER — Ambulatory Visit: Payer: Medicare HMO | Admitting: Physical Therapy

## 2013-12-12 ENCOUNTER — Emergency Department (INDEPENDENT_AMBULATORY_CARE_PROVIDER_SITE_OTHER): Payer: Commercial Managed Care - HMO

## 2013-12-12 ENCOUNTER — Emergency Department (INDEPENDENT_AMBULATORY_CARE_PROVIDER_SITE_OTHER)
Admission: EM | Admit: 2013-12-12 | Discharge: 2013-12-12 | Disposition: A | Discharge status: Home / Self Care | Payer: Commercial Managed Care - HMO | Source: Home / Self Care

## 2013-12-12 ENCOUNTER — Encounter (HOSPITAL_COMMUNITY): Payer: Self-pay | Admitting: Emergency Medicine

## 2013-12-12 DIAGNOSIS — M79609 Pain in unspecified limb: Secondary | ICD-10-CM

## 2013-12-12 DIAGNOSIS — M79605 Pain in left leg: Secondary | ICD-10-CM

## 2013-12-12 LAB — POCT I-STAT, CHEM 8
BUN: 12 mg/dL (ref 6–23)
CALCIUM ION: 1.2 mmol/L (ref 1.12–1.23)
Chloride: 104 mEq/L (ref 96–112)
Creatinine, Ser: 0.8 mg/dL (ref 0.50–1.10)
Glucose, Bld: 97 mg/dL (ref 70–99)
HEMATOCRIT: 41 % (ref 36.0–46.0)
Hemoglobin: 13.9 g/dL (ref 12.0–15.0)
Potassium: 4 mEq/L (ref 3.7–5.3)
Sodium: 141 mEq/L (ref 137–147)
TCO2: 25 mmol/L (ref 0–100)

## 2013-12-12 MED ORDER — TRAMADOL HCL 50 MG PO TABS: 50.0000 mg | ORAL_TABLET | Freq: Four times a day (QID) | ORAL | Status: DC | PRN

## 2013-12-12 MED ORDER — IBUPROFEN 800 MG PO TABS
ORAL_TABLET | ORAL | Status: AC
  Filled 2013-12-12: qty 1

## 2013-12-12 MED ORDER — IBUPROFEN 800 MG PO TABS
800.0000 mg | ORAL_TABLET | Freq: Once | ORAL | Status: AC
  Administered 2013-12-12: 800 mg via ORAL

## 2013-12-12 NOTE — Discharge Instructions (Signed)
Deep Vein Thrombosis FYI A deep vein thrombosis (DVT) is a blood clot that develops in the deep, larger veins of the leg, arm, or pelvis. These are more dangerous than clots that might form in veins near the surface of the body. A DVT can lead to complications if the clot breaks off and travels in the bloodstream to the lungs.  A DVT can damage the valves in your leg veins, so that instead of flowing upward, the blood pools in the lower leg. This is called post-thrombotic syndrome, and it can result in pain, swelling, discoloration, and sores on the leg. CAUSES Usually, several things contribute to blood clots forming. Contributing factors include:  The flow of blood slows down.  The inside of the vein is damaged in some way.  You have a condition that makes blood clot more easily. RISK FACTORS Some people are more likely than others to develop blood clots. Risk factors include:   Older age, especially over 44 years of age.  Having a family history of blood clots or if you have already had a blot clot.  Having major or lengthy surgery. This is especially true for surgery on the hip, knee, or belly (abdomen). Hip surgery is particularly high risk.  Breaking a hip or leg.  Sitting or lying still for a long time. This includes long-distance travel, paralysis, or recovery from an illness or surgery.  Having cancer or cancer treatment.  Having a long, thin tube (catheter) placed inside a vein during a medical procedure.  Being overweight (obese).  Pregnancy and childbirth.  Hormone changes make the blood clot more easily during pregnancy.  The fetus puts pressure on the veins of the pelvis.  There is a risk of injury to veins during delivery or a caesarean. The risk is highest just after childbirth.  Medicines with the female hormone estrogen. This includes birth control pills and hormone replacement therapy.  Smoking.  Other circulation or heart problems.  SIGNS AND  SYMPTOMS When a clot forms, it can either partially or totally block the blood flow in that vein. Symptoms of a DVT can include:  Swelling of the leg or arm, especially if one side is much worse.  Warmth and redness of the leg or arm, especially if one side is much worse.  Pain in an arm or leg. If the clot is in the leg, symptoms may be more noticeable or worse when standing or walking. The symptoms of a DVT that has traveled to the lungs (pulmonary embolism, PE) usually start suddenly and include:  Shortness of breath.  Coughing.  Coughing up blood or blood-tinged phlegm.  Chest pain. The chest pain is often worse with deep breaths.  Rapid heartbeat. Anyone with these symptoms should get emergency medical treatment right away. Call your local emergency services (911 in the [place] if you have these symptoms. DIAGNOSIS If a DVT is suspected, your health care provider will take a full medical history and perform a physical exam. Tests that also may be required include:  Blood tests, including studies of the clotting properties of the blood.  Ultrasonography to see if you have clots in your legs or lungs.  X-rays to show the flow of blood when dye is injected into the veins (venography).  Studies of your lungs if you have any chest symptoms. PREVENTION  Exercise the legs regularly. Take a brisk 30-minute walk every day.  Maintain a weight that is appropriate for your height.  Avoid sitting or lying in  bed for long periods of time without moving your legs.  Women, particularly those over the age of 60 years, should consider the risks and benefits of taking estrogen medicines, including birth control pills.  Do not smoke, especially if you take estrogen medicines.  Long-distance travel can increase your risk of DVT. You should exercise your legs by walking or pumping the muscles every hour.  In-hospital prevention:  Many of the risk factors above relate to situations that  exist with hospitalization, either for illness, injury, or elective surgery.  Your health care provider will assess you for the need for venous thromboembolism prophylaxis when you are admitted to the hospital. If you are having surgery, your surgeon will assess you the day of or day after surgery.  Prevention may include medical and nonmedical measures. TREATMENT Once identified, a DVT can be treated. It can also be prevented in some circumstances. Once you have had a DVT, you may be at increased risk for a DVT in the future. The most common treatment for DVT is blood thinning (anticoagulant) medicine, which reduces the blood's tendency to clot. Anticoagulants can stop new blood clots from forming and stop old ones from growing. They cannot dissolve existing clots. Your body does this by itself over time. Anticoagulants can be given by mouth, by IV access, or by injection. Your health care provider will determine the best program for you. Other medicines or treatments that may be used are:  Heparin or related medicines (low molecular weight heparin) are usually the first treatment for a blood clot. They act quickly. However, they cannot be taken orally.  Heparin can cause a fall in a component of blood that stops bleeding and forms blood clots (platelets). You will be monitored with blood tests to be sure this does not occur.  Warfarin is an anticoagulant that can be swallowed. It takes a few days to start working, so usually heparin or related medicines are used in combination. Once warfarin is working, heparin is usually stopped.  Less commonly, clot dissolving drugs (thrombolytics) are used to dissolve a DVT. They carry a high risk of bleeding, so they are used mainly in severe cases, where your life or a limb is threatened.  Very rarely, a blood clot in the leg needs to be removed surgically.  If you are unable to take anticoagulants, your health care provider may arrange for you to have a  filter placed in a main vein in your abdomen. This filter prevents clots from traveling to your lungs. HOME CARE INSTRUCTIONS  Take all medicines prescribed by your health care provider. Only take over-the-counter or prescription medicines for pain, fever, or discomfort as directed by your health care provider.  Warfarin. Most people will continue taking warfarin after hospital discharge. Your health care provider will advise you on the length of treatment (usually 3 6 months, sometimes lifelong).  Too much and too little warfarin are both dangerous. Too much warfarin increases the risk of bleeding. Too little warfarin continues to allow the risk for blood clots. While taking warfarin, you will need to have regular blood tests to measure your blood clotting time. These blood tests usually include both the prothrombin time (PT) and international normalized ratio (INR) tests. The PT and INR results allow your health care provider to adjust your dose of warfarin. The dose can change for many reasons. It is critically important that you take warfarin exactly as prescribed, and that you have your PT and INR levels drawn exactly as  directed.  Many foods, especially foods high in vitamin K, can interfere with warfarin and affect the PT and INR results. Foods high in vitamin K include spinach, kale, broccoli, cabbage, collard and turnip greens, brussel sprouts, peas, cauliflower, seaweed, and parsley as well as beef and pork liver, green tea, and soybean oil. You should eat a consistent amount of foods high in vitamin K. Avoid major changes in your diet, or notify your health care provider before changing your diet. Arrange a visit with a dietitian to answer your questions.  Many medicines can interfere with warfarin and affect the PT and INR results. You must tell your health care provider about any and all medicines you take. This includes all vitamins and supplements. Be especially cautious with aspirin and  anti-inflammatory medicines. Ask your health care provider before taking these. Do not take or discontinue any prescribed or over-the-counter medicine except on the advice of your health care provider or pharmacist.  Warfarin can have side effects, primarily excessive bruising or bleeding. You will need to hold pressure over cuts for longer than usual. Your health care provider or pharmacist will discuss other potential side effects.  Alcohol can change the body's ability to handle warfarin. It is best to avoid alcoholic drinks or consume only very small amounts while taking warfarin. Notify your health care provider if you change your alcohol intake.  Notify your dentist or other health care providers before procedures.  Activity. Ask your health care provider how soon you can go back to normal activities. It is important to stay active to prevent blood clots. If you are on anticoagulant medicine, avoid contact sports.  Exercise. It is very important to exercise. This is especially important while traveling, sitting, or standing for long periods of time. Exercise your legs by walking or by pumping the muscles frequently. Take frequent walks.  Compression stockings. These are tight elastic stockings that apply pressure to the lower legs. This pressure can help keep the blood in the legs from clotting. You may need to wear compression stockings at home to help prevent a DVT.  Do not smoke. If you smoke, quit. Ask your health care provider for help with quitting smoking.  Learn as much as you can about DVT. Knowing more about the condition should help you keep it from coming back.  Wear a medical alert bracelet or carry a medical alert card. SEEK MEDICAL CARE IF:  You notice a rapid heartbeat.  You feel weaker or more tired than usual.  You feel faint.  You notice increased bruising.  You feel your symptoms are not getting better in the time expected.  You believe you are having side  effects of medicine. SEEK IMMEDIATE MEDICAL CARE IF:  You have chest pain.  You have trouble breathing.  You have new or increased swelling or pain in one leg.  You cough up blood.  You notice blood in vomit, in a bowel movement, or in urine. MAKE SURE YOU:  Understand these instructions.  Will watch your condition.  Will get help right away if you are not doing well or get worse. Document Released: 08/29/2005 Document Revised: 06/19/2013 Document Reviewed: 05/06/2013 [facility] Patient Information 2014 [place], [place]. Muscle Tear A muscle tear is usually caused by over-exertion or stretching. The muscle often takes a while to heal. Muscle tears require 3 to 4 weeks to heal completely. A history of the injury and a physical exam may be performed. Sometimes, the injury is identified with radiographs and  an MRI. Treatment for muscle injuries includes:  Resting and protecting the affected area until pain with motion is gone.  Putting ice on the injured area.  Put ice in a bag.  Place a towel between your skin and the bag.  Leave the ice on for 15 to 20 minutes, 3 to 4 times a day.  After two days, you can use heat to relieve spasms.  Using compression wraps to help control swelling and limit movement.  Raising (elevate) the injured area above the level of the heart (if possible) for the first 1 to 2 days after the injury.  Medicine may be prescribed to reduce pain and inflammation. Avoid strenuous activities that bring on muscle pain. Exercises to strengthen and stretch the injured muscle can help heal the muscle and prevent repeated injury. After the pain and swelling are gone, you may begin gradual strengthening exercises. Begin range-of-motion exercises and gentle stretching after 3 to 4 days of rest.  SEEK MEDICAL CARE IF:  Your injured muscle is not improving after 1 week of treatment. Document Released: 10/06/2004 Document Revised: 11/21/2011 Document Reviewed:  03/13/2009 [facility] Patient Information 2014 [place].

## 2013-12-12 NOTE — ED Notes (Signed)
L leg gave out on her 2 hrs ago and landed on her knee. C/o pain calf and shin of L leg.  No abrasions.  Had sudden sharp pain in her calf and then fell.

## 2013-12-12 NOTE — ED Provider Notes (Signed)
CSN: [id number]     Arrival date & time 12/12/13  1747 History   None    Chief Complaint  Patient presents with  . Fall   (Consider location/radiation/quality/duration/timing/severity/associated sxs/prior Treatment) HPI Comments: 48 yo AAF with Hx of lupus with negative history of clotting disorder associated with Lupus ruled out in the past presents with sudden onset of pain in left calf. She notes pain was sharp and made her leg give out. She denies any previous history of leg pain/ injuries/ strain. She notes pain is in calf and tender to touch. She notes pain does radiates up and down shin in front of her leg also. She gets lymphedema treatments on routine basis to her legs but denies any in last 24 hours.    Past Medical History  Diagnosis Date  . Allergy   . Asthma   . Myocardial infarction   . Lupus    Past Surgical History  Procedure Laterality Date  . Cesarean section    . Abdominal hysterectomy     Family History  Problem Relation Age of Onset  . Heart disease Mother   . Cancer Father    History  Substance Use Topics  . Smoking status: Never Smoker   . Smokeless tobacco: Never Used  . Alcohol Use: No   OB History   Grav Para Term Preterm Abortions TAB SAB Ect Mult Living                 Review of Systems  Musculoskeletal: Positive for myalgias.  Hematological: Does not bruise/bleed easily.  All other systems reviewed and are negative.    Allergies  Review of patient's allergies indicates no known allergies.  Home Medications   Current Outpatient Rx  Name  Route  Sig  Dispense  Refill  . hydroxychloroquine (PLAQUENIL) 200 MG tablet   Oral   Take 200 mg by mouth 2 (two) times daily.         Marland Kitchen albuterol (PROVENTIL) (2.5 MG/3ML) 0.083% nebulizer solution   Nebulization   Take 2.5 mg by nebulization every 6 (six) hours as needed for wheezing.         . fluconazole (DIFLUCAN) 150 MG tablet   Oral   Take 1 tablet (150 mg total) by mouth once. Once  a week for 3 weeks   3 tablet   0   . metroNIDAZOLE (FLAGYL) 500 MG tablet   Oral   Take 1 tablet (500 mg total) by mouth 2 (two) times daily with a meal. DO NOT CONSUME ALCOHOL WHILE TAKING THIS MEDICATION.   14 tablet   0   . naproxen (NAPROSYN) 500 MG tablet   Oral   Take 1 tablet (500 mg total) by mouth 2 (two) times daily with a meal.   30 tablet   0   . predniSONE (DELTASONE) 10 MG tablet   Oral   Take 10 mg by mouth daily.         . traMADol (ULTRAM) 50 MG tablet   Oral   Take 1 tablet (50 mg total) by mouth every 6 (six) hours as needed for severe pain.   30 tablet   0    BP 118/82  Pulse 79  Temp(Src) 98.3 F (36.8 C) (Oral)  Resp 20  SpO2 100% Physical Exam  Nursing note and vitals reviewed. Constitutional: She is oriented to person, place, and time. She appears well-developed and well-nourished.  HENT:  Head: Normocephalic and atraumatic.  Right Ear: External ear  normal.  Left Ear: External ear normal.  Nose: Nose normal.  Mouth/Throat: Oropharynx is clear and moist.  Eyes: Conjunctivae and EOM are normal.  Neck: Normal range of motion. Neck supple.  Cardiovascular: Normal rate, regular rhythm, normal heart sounds and intact distal pulses.   Neg Homans  Pulmonary/Chest: Effort normal and breath sounds normal.  Musculoskeletal: Normal range of motion. She exhibits edema and tenderness.  Tenderness mid calf and mid shin.   Lymphadenopathy:    She has no cervical adenopathy.  Neurological: She is alert and oriented to person, place, and time. She has normal reflexes. No cranial nerve deficit. Coordination normal.  Skin: Skin is warm and dry.  Psychiatric: She has a normal mood and affect. Judgment normal.    ED Course  Procedures (including critical care time) Labs Review Labs Reviewed  POCT I-STAT, CHEM 8   Imaging Review Dg Tibia/fibula Left  12/12/2013   CLINICAL DATA:  Pain, fall  EXAM: LEFT TIBIA AND FIBULA - 2 VIEW  COMPARISON:  None.   FINDINGS: Two views of left tibia-fibula submitted. No acute fracture or subluxation. No periosteal reaction or bony erosion.  IMPRESSION: Negative.   Electronically Signed   By: Lahoma Crocker M.D.   On: 12/12/2013 19:16     MDM  Leg pain- ? Tear vs very low suspicion for DVT- strong explanation given to patient of both diagnosis she will go to ER if any increase in symptoms, otherwise R.I.C.E and f/u ortho AD. Tramadol  50 mg advised to use sparingly.   Ardis Hughs, PA-C 12/12/13 [place], PA-C 12/12/13 1943

## 2013-12-12 NOTE — ED Provider Notes (Signed)
Medical screening examination/treatment/procedure(s) were performed by a resident physician or non-physician practitioner and as the supervising physician I was immediately available for consultation/collaboration.  Lynne Leader, MD    Gregor Hams, MD 12/12/13 2019

## 2013-12-13 ENCOUNTER — Encounter: Payer: Medicare Other | Admitting: Physical Therapy

## 2013-12-19 ENCOUNTER — Encounter (HOSPITAL_COMMUNITY): Payer: Self-pay | Admitting: Emergency Medicine

## 2013-12-19 ENCOUNTER — Emergency Department (HOSPITAL_COMMUNITY)
Admission: EM | Admit: 2013-12-19 | Discharge: 2013-12-19 | Discharge status: Against Medical Advice | Payer: Medicare HMO | Attending: Emergency Medicine | Admitting: Emergency Medicine

## 2013-12-19 DIAGNOSIS — I252 Old myocardial infarction: Secondary | ICD-10-CM | POA: Diagnosis not present

## 2013-12-19 DIAGNOSIS — J45909 Unspecified asthma, uncomplicated: Secondary | ICD-10-CM | POA: Diagnosis not present

## 2013-12-19 DIAGNOSIS — M25559 Pain in unspecified hip: Secondary | ICD-10-CM | POA: Diagnosis not present

## 2013-12-19 DIAGNOSIS — M79609 Pain in unspecified limb: Secondary | ICD-10-CM | POA: Diagnosis present

## 2013-12-19 NOTE — ED Notes (Signed)
Pt c/o right sided hip pain radiates down right leg with shooting pain. Denies trauma, has hx of Lupus. Pt ambulatory without assistance but has shuffling gait.

## 2013-12-19 NOTE — ED Notes (Signed)
Did not answer call from waiting room x 1

## 2013-12-19 NOTE — ED Notes (Signed)
Pt states that Tramadol she was prescribed is not helping

## 2013-12-19 NOTE — ED Notes (Signed)
PT called without response.

## 2013-12-19 NOTE — ED Notes (Signed)
Called x2, no answer 

## 2013-12-20 ENCOUNTER — Emergency Department (HOSPITAL_COMMUNITY)
Admission: EM | Admit: 2013-12-20 | Discharge: 2013-12-21 | Disposition: A | Discharge status: Home / Self Care | Payer: Medicare HMO | Attending: Emergency Medicine | Admitting: Emergency Medicine

## 2013-12-20 ENCOUNTER — Encounter (HOSPITAL_COMMUNITY): Payer: Self-pay | Admitting: Emergency Medicine

## 2013-12-20 DIAGNOSIS — Z79899 Other long term (current) drug therapy: Secondary | ICD-10-CM | POA: Insufficient documentation

## 2013-12-20 DIAGNOSIS — I252 Old myocardial infarction: Secondary | ICD-10-CM | POA: Diagnosis not present

## 2013-12-20 DIAGNOSIS — IMO0002 Reserved for concepts with insufficient information to code with codable children: Secondary | ICD-10-CM | POA: Diagnosis not present

## 2013-12-20 DIAGNOSIS — J45909 Unspecified asthma, uncomplicated: Secondary | ICD-10-CM | POA: Insufficient documentation

## 2013-12-20 DIAGNOSIS — M25559 Pain in unspecified hip: Secondary | ICD-10-CM | POA: Diagnosis present

## 2013-12-20 DIAGNOSIS — M329 Systemic lupus erythematosus, unspecified: Secondary | ICD-10-CM | POA: Insufficient documentation

## 2013-12-20 MED ORDER — ONDANSETRON 4 MG PO TBDP
4.0000 mg | ORAL_TABLET | Freq: Once | ORAL | Status: AC
Start: 2013-12-21 — End: 2013-12-21
  Administered 2013-12-21: 4 mg via ORAL
  Filled 2013-12-20: qty 1

## 2013-12-20 MED ORDER — OXYCODONE-ACETAMINOPHEN 5-325 MG PO TABS
2.0000 | ORAL_TABLET | Freq: Once | ORAL | Status: AC
  Administered 2013-12-21: 2 via ORAL
  Filled 2013-12-20: qty 2

## 2013-12-20 NOTE — ED Notes (Signed)
The pt is c/o rt hip pain  And lt leg pain for 2-3 days.  She also had a headache.  Hx of lupus.  [facility] and was referred to an ortho doctor yesterday but she had too much pain to go

## 2013-12-20 NOTE — ED Provider Notes (Signed)
CSN: [id number]     Arrival date & time 12/20/13  2053 History   First MD Initiated Contact with Patient 12/20/13 2321     Chief Complaint  Patient presents with  . Hip Pain     (Consider location/radiation/quality/duration/timing/severity/associated sxs/prior Treatment) HPI Patient is a 48 yo woman with SLE. She presents with bilateral leg pain for the past 3 days. Her pain is more severe on the right. Pain on right radiates from the right SI region to the right hip and then down the back of the leg. She has less severe pain in the left popliteal fossa. Pain on the right is severe, worse with palpation over the right lateral hip. No history of trauma. No paresthesias or motor weakness. No fever.   Patient also complains of a headache which she believes is secondary to pain.   Past Medical History  Diagnosis Date  . Allergy   . Asthma   . Myocardial infarction   . Lupus    Past Surgical History  Procedure Laterality Date  . Cesarean section    . Abdominal hysterectomy     Family History  Problem Relation Age of Onset  . Heart disease Mother   . Cancer Father    History  Substance Use Topics  . Smoking status: Never Smoker   . Smokeless tobacco: Never Used  . Alcohol Use: No   OB History   Grav Para Term Preterm Abortions TAB SAB Ect Mult Living                 Review of Systems Ten point review of symptoms performed and is negative with the exception of symptoms noted above.     Allergies  Review of patient's allergies indicates no known allergies.  Home Medications   Current Outpatient Rx  Name  Route  Sig  Dispense  Refill  . albuterol (PROVENTIL) (2.5 MG/3ML) 0.083% nebulizer solution   Nebulization   Take 2.5 mg by nebulization every 6 (six) hours as needed for wheezing.         . hydroxychloroquine (PLAQUENIL) 200 MG tablet   Oral   Take 200 mg by mouth 2 (two) times daily.         . predniSONE (DELTASONE) 10 MG tablet   Oral   Take 10 mg by  mouth daily.         . traMADol (ULTRAM) 50 MG tablet   Oral   Take 1 tablet (50 mg total) by mouth every 6 (six) hours as needed for severe pain.   30 tablet   0    BP 113/94  Pulse 80  Temp(Src) 98 F (36.7 C) (Oral)  Resp 16  Wt 210 lb 12.8 oz (95.618 kg)  SpO2 96% Physical Exam Gen: well developed and well nourished appearing Head: NCAT Eyes: PERL, EOMI Nose: no epistaixis or rhinorrhea Mouth/throat: mucosa is moist and pink Neck: supple, no stridor  Lungs: CTA B, no wheezing, rhonchi or rales CV: RRR, no murmur, extremities appear well perfused.  Abd: soft, notender, nondistended Back: no ttp, no cva ttp Skin: warm and dry Ext: normal to inspection, ttp over the proximal lateral right thigh, able to range right hip, NVI, no dependent edema Neuro: CN ii-xii grossly intact, no focal deficits Psyche; normal affect,  calm and cooperative.   ED Course  Procedures (including critical care time)  Labs Review DG Hip Complete Right (Final result)  Result time: 12/21/13 01:00:52    Final result by Rad  Results In Interface (12/21/13 01:00:52)    Narrative:   CLINICAL DATA: Severe right hip pain for 3 days. No trauma.  EXAM: RIGHT HIP - COMPLETE 2+ VIEW  COMPARISON: None.  FINDINGS: There is no evidence of hip fracture or dislocation. There is no evidence of arthropathy or other focal bone abnormality.  IMPRESSION: Negative.   Electronically Signed By: Lucienne Capers M.D. On: 12/21/2013 01:00     MDM    Cause of hip pain is unclear. No fx identified. No signs of sx of infectious process. DDX includes SLE flare, radiculopathy, sciatica.  Patient referred to the [facility] for outpatient f/u because she says she can't get into see her PCP. Home with prednisone burst and Vicodin for pain prn.   Elyn Peers, MD 12/21/13 [phone]

## 2013-12-21 ENCOUNTER — Emergency Department (HOSPITAL_COMMUNITY): Payer: Medicare HMO

## 2013-12-21 DIAGNOSIS — M25559 Pain in unspecified hip: Secondary | ICD-10-CM | POA: Diagnosis not present

## 2013-12-21 LAB — CBC
HCT: 39.8 % (ref 36.0–46.0)
Hemoglobin: 13.2 g/dL (ref 12.0–15.0)
MCH: 29.5 pg (ref 26.0–34.0)
MCHC: 33.2 g/dL (ref 30.0–36.0)
MCV: 89 fL (ref 78.0–100.0)
Platelets: 197 10*3/uL (ref 150–400)
RBC: 4.47 MIL/uL (ref 3.87–5.11)
RDW: 13.6 % (ref 11.5–15.5)
WBC: 3.4 10*3/uL — ABNORMAL LOW (ref 4.0–10.5)

## 2013-12-21 MED ORDER — PREDNISONE 20 MG PO TABS
60.0000 mg | ORAL_TABLET | Freq: Once | ORAL | Status: AC
  Administered 2013-12-21: 60 mg via ORAL
  Filled 2013-12-21: qty 3

## 2013-12-21 MED ORDER — HYDROCODONE-ACETAMINOPHEN 5-325 MG PO TABS: 1.0000 | ORAL_TABLET | ORAL | Status: DC | PRN

## 2013-12-21 MED ORDER — PREDNISONE 20 MG PO TABS: ORAL_TABLET | ORAL | Status: DC

## 2013-12-27 ENCOUNTER — Encounter: Payer: Medicare Other | Admitting: Physical Therapy

## 2014-01-03 ENCOUNTER — Encounter (HOSPITAL_COMMUNITY): Payer: Self-pay | Admitting: Emergency Medicine

## 2014-01-03 ENCOUNTER — Encounter: Payer: Medicare Other | Admitting: Physical Therapy

## 2014-01-03 ENCOUNTER — Emergency Department (HOSPITAL_COMMUNITY)
Admission: EM | Admit: 2014-01-03 | Discharge: 2014-01-03 | Disposition: A | Discharge status: Home / Self Care | Payer: Medicare HMO | Attending: Emergency Medicine | Admitting: Emergency Medicine

## 2014-01-03 DIAGNOSIS — M329 Systemic lupus erythematosus, unspecified: Secondary | ICD-10-CM | POA: Insufficient documentation

## 2014-01-03 DIAGNOSIS — Z79899 Other long term (current) drug therapy: Secondary | ICD-10-CM | POA: Diagnosis not present

## 2014-01-03 DIAGNOSIS — M25549 Pain in joints of unspecified hand: Secondary | ICD-10-CM | POA: Diagnosis present

## 2014-01-03 DIAGNOSIS — J45909 Unspecified asthma, uncomplicated: Secondary | ICD-10-CM | POA: Diagnosis not present

## 2014-01-03 DIAGNOSIS — IMO0002 Reserved for concepts with insufficient information to code with codable children: Secondary | ICD-10-CM | POA: Insufficient documentation

## 2014-01-03 DIAGNOSIS — M6740 Ganglion, unspecified site: Secondary | ICD-10-CM | POA: Insufficient documentation

## 2014-01-03 DIAGNOSIS — I252 Old myocardial infarction: Secondary | ICD-10-CM | POA: Diagnosis not present

## 2014-01-03 DIAGNOSIS — M67439 Ganglion, unspecified wrist: Secondary | ICD-10-CM

## 2014-01-03 MED ORDER — HYDROCODONE-ACETAMINOPHEN 5-325 MG PO TABS
1.0000 | ORAL_TABLET | Freq: Once | ORAL | Status: AC
  Administered 2014-01-03: 1 via ORAL
  Filled 2014-01-03: qty 1

## 2014-01-03 MED ORDER — HYDROCODONE-ACETAMINOPHEN 5-325 MG PO TABS: 1.0000 | ORAL_TABLET | ORAL | Status: DC | PRN

## 2014-01-03 NOTE — Discharge Instructions (Signed)
Ganglion Cyst A ganglion cyst is a noncancerous, fluid-filled lump that occurs near joints or tendons. The ganglion cyst grows out of a joint or the lining of a tendon. It most often develops in the hand or wrist but can also develop in the shoulder, elbow, hip, knee, ankle, or foot. The round or oval ganglion can be pea sized or larger than a grape. Increased activity may enlarge the size of the cyst because more fluid starts to build up.  CAUSES  It is not completely known what causes a ganglion cyst to grow. However, it may be related to:  Inflammation or irritation around the joint.  An injury.  Repetitive movements or overuse.  Arthritis. SYMPTOMS  A lump most often appears in the hand or wrist, but can occur in other areas of the body. Generally, the lump is painless without other symptoms. However, sometimes pain can be felt during activity or when pressure is applied to the lump. The lump may even be tender to the touch. Tingling, pain, numbness, or muscle weakness can occur if the ganglion cyst presses on a nerve. Your grip may be weak and you may have less movement in your joints.  DIAGNOSIS  Ganglion cysts are most often diagnosed based on a physical exam, noting where the cyst is and how it looks. Your caregiver will feel the lump and may shine a light alongside it. If it is a ganglion, a light often shines through it. Your caregiver may order an X-ray, ultrasound, or MRI to rule out other conditions. TREATMENT  Ganglions usually go away on their own without treatment. If pain or other symptoms are involved, treatment may be needed. Treatment is also needed if the ganglion limits your movement or if it gets infected. Treatment options include:  Wearing a wrist or finger brace or splint.  Taking anti-inflammatory medicine.  Draining fluid from the lump with a needle (aspiration).  Injecting a steroid into the joint.  Surgery to remove the ganglion cyst and its stalk that is  attached to the joint or tendon. However, ganglion cysts can grow back. HOME CARE INSTRUCTIONS   Do not press on the ganglion, poke it with a needle, or hit it with a heavy object. You may rub the lump gently and often. Sometimes fluid moves out of the cyst.  Only take medicines as directed by your caregiver.  Wear your brace or splint as directed by your caregiver. SEEK MEDICAL CARE IF:   Your ganglion becomes larger or more painful.  You have increased redness, red streaks, or swelling.  You have pus coming from the lump.  You have weakness or numbness in the affected area. MAKE SURE YOU:   Understand these instructions.  Will watch your condition.  Will get help right away if you are not doing well or get worse. Document Released: 08/26/2000 Document Revised: 05/23/2012 Document Reviewed: 10/23/2007 [facility] Patient Information 2014 [place]. Heat Therapy Heat therapy can help ease achy, tense, stiff, and tight muscles and joints. Heat should not be used on new injuries. Wait at least 48 hours after the injury before using heat therapy. Heat also should not be used for discomfort or pain that occurs right after doing an activity. If you still have pain or stiffness 3 hours after finishing the activity, then heat therapy may be used. PRECAUTIONS  High heat or prolonged exposure to heat can cause burns. Be careful when using heat therapy to avoid burning your skin. If you have any of  the following conditions, do not use heat until you have discussed heat therapy with your caregiver:  Poor circulation.  Healing wounds or scarred skin in the area being treated.  Diabetes, heart disease, or high blood pressure.  Numbness of the area being treated.  Unusual swelling of the area being treated.  Active infections.  Blood clots.  Cancer.  Inability to communicate your response to pain. This can include young children and people with dementia. HOME CARE  INSTRUCTIONS Moist heat pack  Soak a clean towel in warm water, and squeeze out the extra water. The water temperature should be comfortable to the skin.  Put the warm, wet towel in a plastic bag.  Place a thin, dry towel between your skin and the bag.  Put the heat pack on the area for 5 minutes, and check your skin. Your skin may be pink, but it should not be red.  Leave the heat pack on the area for a total of 15 to 30 minutes.  Repeat this every 2 to 4 hours while awake. Do not use heat while you are sleeping. Warm water bath  Fill a tub with warm water. The water temperature should be comfortable to the skin.  Place the affected body part in the tub.  Soak the area for 20 to 40 minutes.  Repeat as needed. Hot water bottle  Fill the water bottle half full with hot water.  Press out the extra air. Close the cap tightly.  Place a dry towel between your skin and the bottle.  Put the bottle on the area for 5 minutes, and check your skin. Your skin may be pink, but it should not be red.  Leave the bottle on the area for a total of 15 to 30 minutes.  Repeat this every 2 to 4 hours while awake. Electric heating pad  Place a dry towel between your skin and the heating pad.  Set the heating pad on low heat.  Put the heating pad on the area for 10 minutes, and check your skin. Your skin may be pink, but it should not be red.  Leave the heating pad on the area for a total of 20 to 40 minutes.  Repeat this every 2 to 4 hours while awake.  Do not lie on the heating pad.  Do not fall asleep while using the heating pad.  Do not use the heating pad near water. Contact with water can result in an electrical shock. SEEK MEDICAL CARE IF:  You have blisters, redness, swelling, or numbness.  You have any new problems.  Your problems are getting worse.  You have any questions or concerns. If you develop any problems, stop using heat therapy until you see your  caregiver. MAKE SURE YOU:  Understand these instructions.  Will watch your condition.  Will get help right away if you are not doing well or get worse. Document Released: 11/21/2011 Document Reviewed: 11/21/2011 [facility] Patient Information 2014 [place].

## 2014-01-03 NOTE — ED Provider Notes (Signed)
CSN: [id number]     Arrival date & time 01/03/14  2011 History  This chart was scribed for non-physician practitioner, Charlann Lange, PA-C working with Ephraim Hamburger, MD by Frederich Balding, ED scribe. This patient was seen in room TR06C/TR06C and the patient's care was started at 8:37 PM.   Chief Complaint  Patient presents with  . Hand Pain   The history is provided by the patient. No language interpreter was used.   HPI Comments: Brittney Maddox is a 48 y.o. female with history of lupus who presents to the Emergency Department complaining of a mass to her left hand that started several months ago. She states she is starting to have increased pain to it 2 days ago. States the pain radiates up her arm. Denies fall, injury or heavy lifting. Denies drainage from area. Denies history of diabetes or hypertension.   Past Medical History  Diagnosis Date  . Allergy   . Asthma   . Myocardial infarction   . Lupus    Past Surgical History  Procedure Laterality Date  . Cesarean section    . Abdominal hysterectomy     Family History  Problem Relation Age of Onset  . Heart disease Mother   . Cancer Father    History  Substance Use Topics  . Smoking status: Never Smoker   . Smokeless tobacco: Never Used  . Alcohol Use: No   OB History   Grav Para Term Preterm Abortions TAB SAB Ect Mult Living                 Review of Systems  Skin:       Ganglion cyst.  All other systems reviewed and are negative.  Allergies  Review of patient's allergies indicates no known allergies.  Home Medications   Prior to Admission medications   Medication Sig Start Date End Date Taking? Authorizing Provider  albuterol (PROVENTIL) (2.5 MG/3ML) 0.083% nebulizer solution Take 2.5 mg by nebulization every 6 (six) hours as needed for wheezing.    Historical Provider, MD  HYDROcodone-acetaminophen (NORCO/VICODIN) 5-325 MG per tablet Take 1-2 tablets by mouth every 4 (four) hours as needed. 12/21/13   Elyn Peers, MD  hydroxychloroquine (PLAQUENIL) 200 MG tablet Take 200 mg by mouth 2 (two) times daily.    Historical Provider, MD  predniSONE (DELTASONE) 10 MG tablet Take 10 mg by mouth daily.    Historical Provider, MD  predniSONE (DELTASONE) 20 MG tablet 3 tabs po day one, then 2 tabs daily x 4 days 12/21/13   Elyn Peers, MD  traMADol (ULTRAM) 50 MG tablet Take 1 tablet (50 mg total) by mouth every 6 (six) hours as needed for severe pain. 12/12/13   Melissa R Smith, PA-C   BP 130/80  Pulse 58  Temp(Src) 98.1 F (36.7 C) (Oral)  Resp 18  SpO2 100%  Physical Exam  Nursing note and vitals reviewed. Constitutional: She is oriented to person, place, and time. She appears well-developed and well-nourished. No distress.  HENT:  Head: Normocephalic and atraumatic.  Eyes: EOM are normal.  Neck: Neck supple. No tracheal deviation present.  Cardiovascular: Normal rate.   Pulmonary/Chest: Effort normal. No respiratory distress.  Musculoskeletal: Normal range of motion.  Left hand with a nodular swelling at the base of the third metacarpal that is mobile, tender. Consistent with ganglion cyst. No other swelling. No evidence of infection.  Neurological: She is alert and oriented to person, place, and time.  Skin: Skin is warm  and dry.  Psychiatric: She has a normal mood and affect. Her behavior is normal.    ED Course  Procedures (including critical care time)  DIAGNOSTIC STUDIES: Oxygen Saturation is 100% on RA, normal by my interpretation.    COORDINATION OF CARE: 8:39 PM-Discussed treatment plan which includes pain medication and warm compresses with pt at bedside and pt agreed to plan. Will give pt orthopedic referral and advised her to follow up.   Labs Review Labs Reviewed - No data to display  Imaging Review No results found.   EKG Interpretation None      MDM   Final diagnoses:  None    1. Ganglion cyst  Uncomplicated cyst of left hand without evidence of  infection.  I personally performed the services described in this documentation, which was scribed in my presence. The recorded information has been reviewed and is accurate.  Dewaine Oats, PA-C 01/03/14 2107

## 2014-01-03 NOTE — ED Notes (Signed)
Pt. reports painful mass left hand pain for several months denies injury / no drainage.

## 2014-01-06 NOTE — ED Provider Notes (Signed)
Medical screening examination/treatment/procedure(s) were performed by non-physician practitioner and as supervising physician I was immediately available for consultation/collaboration.   EKG Interpretation None        Ephraim Hamburger, MD 01/06/14 [phone]

## 2019-05-08 ENCOUNTER — Ambulatory Visit
Admission: EM | Admit: 2019-05-08 | Discharge: 2019-05-08 | Disposition: A | Discharge status: Home / Self Care | Payer: Medicare Other | Attending: Physician Assistant | Admitting: Physician Assistant

## 2019-05-08 DIAGNOSIS — M542 Cervicalgia: Secondary | ICD-10-CM

## 2019-05-08 DIAGNOSIS — R519 Headache, unspecified: Secondary | ICD-10-CM

## 2019-05-08 DIAGNOSIS — R51 Headache: Secondary | ICD-10-CM

## 2019-05-08 DIAGNOSIS — M546 Pain in thoracic spine: Secondary | ICD-10-CM

## 2019-05-08 DIAGNOSIS — G8929 Other chronic pain: Secondary | ICD-10-CM

## 2019-05-08 MED ORDER — PREDNISONE 50 MG PO TABS: 50.0000 mg | ORAL_TABLET | Freq: Every day | ORAL | 0 refills | Status: DC

## 2019-05-08 MED ORDER — METHOCARBAMOL 500 MG PO TABS: 500.0000 mg | ORAL_TABLET | Freq: Two times a day (BID) | ORAL | 0 refills | Status: DC

## 2019-05-08 MED ORDER — DEXAMETHASONE SODIUM PHOSPHATE 10 MG/ML IJ SOLN
10.0000 mg | Freq: Once | INTRAMUSCULAR | Status: AC
  Administered 2019-05-08: 13:00:00 10 mg via INTRAMUSCULAR

## 2019-05-08 MED ORDER — KETOROLAC TROMETHAMINE 30 MG/ML IJ SOLN
30.0000 mg | Freq: Once | INTRAMUSCULAR | Status: AC
  Administered 2019-05-08: 13:00:00 30 mg via INTRAMUSCULAR

## 2019-05-08 NOTE — ED Triage Notes (Signed)
Patient complains of constant, worsening posterior headache and neck pain - per patient radiates down spine - X 3 months. Patient also complains of neck stiffness and limited ROM. Patient has recently developed occasional blurred vision. Patient has tried OTC medication, muscle relaxers and pain patches prescribed by out of town PCP. PCP recommended neuro referral as well as CT scan - patient has not done either.

## 2019-05-08 NOTE — ED Provider Notes (Signed)
[name]    CSN: [id number] Arrival date & time: 05/08/19  1158      History   Chief Complaint Chief Complaint  Patient presents with  . Headache    HPI Brittney Maddox is a 53 y.o. female.   53 year old female comes in for 3 month history of posterior headache and neck pain. States she has been evaluated by PCP and was recommended to have neurology referral/CT scan, but has not gotten that done. States PCP is out of town, and needs to find a new one in [place]. She woke up this morning with some blurry vision, which has since resolved. She states she has trouble rotating neck due to the pain, which can radiate down the back. She has history of head injury 10/2018 where she hit her head to the [employer] top. States at that time CT scan was negative. She denies confusion, nausea/vomiting, weakness, dizziness, syncope. She works as a Quarry manager, no heavy lifting, does have to turn/change patient. She has tried flexeril, tylenol, excedrin without relief.      Past Medical History:  Diagnosis Date  . Allergy   . Asthma   . Lupus ([place])   . Myocardial infarction [facility])     Patient Active Problem List   Diagnosis Date Noted  . Prediabetes 08/20/2013  . Morbid obesity ([place]) 08/20/2013  . Elevated LFTs 08/20/2013  . Fatty liver 08/20/2013  . Vaginitis and vulvovaginitis 07/12/2013  . 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 Lupus (systemic lupus erythematosus) ([place]) 06/24/2013    Past Surgical History:  Procedure Laterality Date  . ABDOMINAL HYSTERECTOMY    . CESAREAN SECTION      OB History   No obstetric history on file.      Home Medications    Prior to Admission medications   Medication Sig Start Date End Date Taking? Authorizing Provider  albuterol (PROVENTIL) (2.5 MG/3ML) 0.083% nebulizer solution Take 2.5 mg by nebulization every 6 (six) hours as needed for wheezing.    [provider]  hydroxychloroquine (PLAQUENIL) 200 MG tablet Take 200 mg by mouth  2 (two) times daily.    [provider]  methocarbamol (ROBAXIN) 500 MG tablet Take 1 tablet (500 mg total) by mouth 2 (two) times daily. 05/08/19   Ok Edwards, PA-C  predniSONE (DELTASONE) 50 MG tablet Take 1 tablet (50 mg total) by mouth daily with breakfast. 05/08/19   Ok Edwards, PA-C    Family History Family History  Problem Relation Age of Onset  . Heart disease Mother   . Cancer Father     Social History Social History   Tobacco Use  . Smoking status: Never Smoker  . Smokeless tobacco: Never Used  Substance Use Topics  . Alcohol use: No  . Drug use: No     Allergies   Patient has no known allergies.   Review of Systems Review of Systems  Reason unable to perform ROS: See HPI as above.     Physical Exam Triage Vital Signs ED Triage Vitals  Enc Vitals Group     BP 05/08/19 1210 124/84     Pulse Rate 05/08/19 1210 92     Resp 05/08/19 1210 16     Temp 05/08/19 1210 98.9 F (37.2 C)     Temp Source 05/08/19 1210 Oral     SpO2 05/08/19 1210 96 %     Weight --      Height --      Head Circumference --  Peak Flow --      Pain Score 05/08/19 1219 10     Pain Loc --      Pain Edu? --      Excl. in [place]? --    No data found.  Updated Vital Signs BP 124/84 (BP Location: Left Arm)   Pulse 92   Temp 98.9 F (37.2 C) (Oral)   Resp 16   SpO2 96%   Physical Exam Constitutional:      General: She is not in acute distress.    Appearance: She is well-developed. She is not ill-appearing, toxic-appearing or diaphoretic.  HENT:     Head: Normocephalic and atraumatic.  Eyes:     Conjunctiva/sclera: Conjunctivae normal.     Pupils: Pupils are equal, round, and reactive to light.  Neck:     Comments: Diffuse tenderness to palpation of the neck. Decreased ROM due to pain. Strength deferred. Sensation intact.  Cardiovascular:     Rate and Rhythm: Normal rate and regular rhythm.     Heart sounds: Normal heart sounds. No murmur. No friction rub. No  gallop.   Pulmonary:     Effort: Pulmonary effort is normal. No accessory muscle usage or respiratory distress.     Breath sounds: Normal breath sounds. No stridor. No decreased breath sounds, wheezing, rhonchi or rales.  Musculoskeletal:     Comments: Diffuse tenderness to palpation of the thoracic back/shoulder. Decreased ROM of shoulder due to pain. Patient lifting shoulder to prevent ROM of shoulder during active and passive ROM. Strength deferred at the shoulder. Strength normal and equal of the elbow, wrist. Normal grip strength. Sensation intact and equal bilaterally.  Radial pulses 2+ and equal bilaterally. Capillary refill less than 2 seconds.   Skin:    General: Skin is warm and dry.  Neurological:     Mental Status: She is alert and oriented to person, place, and time.     GCS: GCS eye subscore is 4. GCS verbal subscore is 5. GCS motor subscore is 6.     Comments: Cranial nerves grossly intact, some limitations on ROM of neck and cranial nerve 11, however, more due to pain with movement. Normal coordination, gait intact.     UC Treatments / Results  Labs (all labs ordered are listed, but only abnormal results are displayed) Labs Reviewed - No data to display  EKG   Radiology No results found.  Procedures Procedures (including critical care time)  Medications Ordered in UC Medications  ketorolac (TORADOL) 30 MG/ML injection 30 mg (30 mg Intramuscular Given 05/08/19 1252)  dexamethasone (DECADRON) injection 10 mg (10 mg Intramuscular Given 05/08/19 1253)    Initial Impression / Assessment and Plan / UC Course  I have reviewed the triage vital signs and the nursing notes.  Pertinent labs & imaging results that were available during my care of the patient were reviewed by me and considered in my medical decision making (see chart for details).    Given chronic nature of symptoms, discussed will need further follow up and evaluation for etiology. Will provide toradol and  decadron injection in office for symptomatic treatment. Will provide prednisone and muscle relaxant. Follow up with PCP for further evaluation needed, resources provided. Return precautions given.  Final Clinical Impressions(s) / UC Diagnoses   Final diagnoses:  Chronic neck pain  Chronic bilateral thoracic back pain  Chronic intractable headache, unspecified headache type   ED Prescriptions    Medication Sig Dispense Auth. Provider   predniSONE (DELTASONE) 50  MG tablet Take 1 tablet (50 mg total) by mouth daily with breakfast. 5 tablet Norberta Stobaugh V, PA-C   methocarbamol (ROBAXIN) 500 MG tablet Take 1 tablet (500 mg total) by mouth 2 (two) times daily. 20 tablet Tobin Chad, [place] 05/08/19 1345

## 2019-05-08 NOTE — Discharge Instructions (Signed)
Start prednisone as directed. Robaxin as needed, this can make you drowsy, so do not take if you are going to drive, operate heavy machinery, or make important decisions. Ice/heat compresses as needed. Please follow up with PCP for further evaluation and possible referrals needed.

## 2019-05-23 ENCOUNTER — Ambulatory Visit: Payer: Self-pay

## 2019-05-23 ENCOUNTER — Ambulatory Visit: Payer: Medicare Other

## 2019-05-23 ENCOUNTER — Ambulatory Visit (INDEPENDENT_AMBULATORY_CARE_PROVIDER_SITE_OTHER): Payer: Medicare Other

## 2019-05-23 ENCOUNTER — Ambulatory Visit (INDEPENDENT_AMBULATORY_CARE_PROVIDER_SITE_OTHER): Payer: Medicare Other | Admitting: Family Medicine

## 2019-05-23 DIAGNOSIS — M5412 Radiculopathy, cervical region: Secondary | ICD-10-CM

## 2019-05-23 DIAGNOSIS — M3219 Other organ or system involvement in systemic lupus erythematosus: Secondary | ICD-10-CM

## 2019-05-23 DIAGNOSIS — R51 Headache: Secondary | ICD-10-CM

## 2019-05-23 DIAGNOSIS — G500 Trigeminal neuralgia: Secondary | ICD-10-CM

## 2019-05-23 DIAGNOSIS — R519 Headache, unspecified: Secondary | ICD-10-CM

## 2019-05-23 MED ORDER — METHOCARBAMOL 500 MG PO TABS: 500.0000 mg | ORAL_TABLET | Freq: Two times a day (BID) | ORAL | 1 refills | Status: DC

## 2019-05-23 MED ORDER — CARBAMAZEPINE ER 100 MG PO TB12: 100.0000 mg | ORAL_TABLET | Freq: Two times a day (BID) | ORAL | 1 refills | Status: DC

## 2019-05-23 MED ORDER — GABAPENTIN 300 MG PO CAPS: 300.0000 mg | ORAL_CAPSULE | Freq: Every day | ORAL | 1 refills | Status: DC

## 2019-05-23 MED ORDER — PREDNISONE 50 MG PO TABS: 50.0000 mg | ORAL_TABLET | Freq: Every day | ORAL | 0 refills | Status: DC

## 2019-05-23 NOTE — Progress Notes (Signed)
States that she noticed some improvement in the headaches & neck pain after receiving a steroid shot in the urgent care and taking the Prednisone. Once finishing the steroid pack the pain resumed.  Has c/o headaches, R ear pain, R jaw pain,

## 2019-05-23 NOTE — Progress Notes (Signed)
Virtual Visit via Telephone Note  I connected with Brittney Maddox, on 05/23/2019 at 10:54 AM by telephone due to the COVID-19 pandemic and verified that I am speaking with the correct person using two identifiers.   Consent: I discussed the limitations, risks, security and privacy concerns of performing an evaluation and management service by telephone and the availability of in person appointments. I also discussed with the patient that there may be a patient responsible charge related to this service. The patient expressed understanding and agreed to proceed.   Location of Patient: Home  Location of Provider: Clinic  Persons participating in Telemedicine visit: Brittney Maddox [facility] Dr. Edwin Cap - mother of patient    History of Present Illness: Brittney Maddox is a 53 year old female with a history of Lupus who presents today to establish care.  She is currently not under the care of a rheumatologist for management of her lupus as she recently returned back to [facility] but she has been compliant with Plaquenil. She complains of a 3 month history of an occipital headache which radiates to her neck and to the mid aspect of her thoracic back.  Headache is described as throbbing and she has associated difficulty with turning her neck. Numbness and tinling occur in her fingers and L arm Had blurry vision on 2 occassions when she woke up to use the bathroom She had a history of migraines when she was younder TENS units has been helpful.  She gives a history of head trauma at work which occurred 7 months ago. In the last 2 weeks she has had difficulty opening her mouth completely to take a bite with pain in her right jaw, sounds in her right appear muffled but no symptoms with the left. She had a fever of 99, 101 at the onset but none now.  Seen in urgent care on 05/08/2019 and prescribed prednisone and Robaxin; prednisone provided some relief but not Robaxin. Past  Medical History:  Diagnosis Date  . Allergy   . Asthma   . Lupus ([place])   . Myocardial infarction ([place])    No Known Allergies  Current Outpatient Medications on File Prior to Visit  Medication Sig Dispense Refill  . hydroxychloroquine (PLAQUENIL) 200 MG tablet Take 200 mg by mouth 2 (two) times daily.    . methocarbamol (ROBAXIN) 500 MG tablet Take 1 tablet (500 mg total) by mouth 2 (two) times daily. 20 tablet 0   No current facility-administered medications on file prior to visit.     Observations/Objective: Awake, alert, oriented x3 Not in acute distress  Assessment and Plan: 1. Cervical radiculopathy - predniSONE (DELTASONE) 50 MG tablet; Take 1 tablet (50 mg total) by mouth daily with breakfast.  Dispense: 5 tablet; Refill: 0 - methocarbamol (ROBAXIN) 500 MG tablet; Take 1 tablet (500 mg total) by mouth 2 (two) times daily.  Dispense: 60 tablet; Refill: 1 - gabapentin (NEURONTIN) 300 MG capsule; Take 1 capsule (300 mg total) by mouth at bedtime.  Dispense: 30 capsule; Refill: 1 - DG Cervical Spine Complete  2. Trigeminal neuralgia - predniSONE (DELTASONE) 50 MG tablet; Take 1 tablet (50 mg total) by mouth daily with breakfast.  Dispense: 5 tablet; Refill: 0 - carbamazepine (TEGRETOL XR) 100 MG 12 hr tablet; Take 1 tablet (100 mg total) by mouth 2 (two) times daily.  Dispense: 60 tablet; Refill: 1  3. Occipital headache If symptoms persist will need to commence on Topamax - methocarbamol (ROBAXIN) 500 MG  tablet; Take 1 tablet (500 mg total) by mouth 2 (two) times daily.  Dispense: 60 tablet; Refill: 1  4. Other systemic lupus erythematosus with other organ involvement [facility]) Currently on Plaquenil - Ambulatory referral to Rheumatology   Follow Up Instructions: 2 weeks   I discussed the assessment and treatment plan with the patient. The patient was provided an opportunity to ask questions and all were answered. The patient agreed with the plan and demonstrated an  understanding of the instructions.   The patient was advised to call back or seek an in-person evaluation if the symptoms worsen or if the condition fails to improve as anticipated.     I provided 25 minutes total of non-face-to-face time during this encounter including median intraservice time, reviewing previous notes, labs, imaging, medications, management and patient verbalized understanding.     Charlott Rakes, MD, FAAFP. [facility], [place]   05/23/2019, 10:54 AM

## 2019-05-23 NOTE — Progress Notes (Signed)
Patient here for xray.

## 2019-05-24 ENCOUNTER — Other Ambulatory Visit: Payer: Self-pay | Admitting: Family Medicine

## 2019-05-24 MED ORDER — BUTALBITAL-APAP-CAFFEINE 50-325-40 MG PO TABS: 1.0000 | ORAL_TABLET | Freq: Two times a day (BID) | ORAL | 0 refills | Status: DC | PRN

## 2019-05-24 NOTE — Progress Notes (Signed)
Patient has been set up an appointment and has been notified.

## 2019-05-28 ENCOUNTER — Telehealth: Payer: Self-pay | Admitting: Pharmacist

## 2019-05-28 MED ORDER — TIZANIDINE HCL 4 MG PO TABS: 4.0000 mg | ORAL_TABLET | Freq: Three times a day (TID) | ORAL | 0 refills | Status: DC | PRN

## 2019-05-28 NOTE — Addendum Note (Signed)
Addended by: Charlott Rakes on: 05/28/2019 05:22 PM   Modules accepted: Orders

## 2019-05-28 NOTE — Telephone Encounter (Signed)
Received request from patient's pharmacy. Methocarbamol is not covered by patient's insurance plan. Covered alternatives include tizanidine 4 mg (tier 2) or baclofen 10 mg (tier 3). Will route to patient's PCP.

## 2019-05-28 NOTE — Telephone Encounter (Signed)
Changed to Tizanidine.

## 2019-05-30 ENCOUNTER — Telehealth: Payer: Self-pay | Admitting: Pharmacist

## 2019-05-30 NOTE — Telephone Encounter (Signed)
Received request from patient's pharmacy. Fioricet is not covered by patient's insurance plan. Covered alternatives include sumatriptan 25 mg (tier 2) or rizatriptan 10 mg (tier 3). Will route to patient's PCP.

## 2019-05-31 NOTE — Telephone Encounter (Signed)
We do not have a diagnosis of migraines hence my reluctance to place her on Imitrex or Maxalt.  She is currently on Tegretol and if she continues to have headache please advised to use Excedrin or Tylenol OTC as she has an upcoming appointment with me on 06/06/2019.  Thank you

## 2019-06-05 ENCOUNTER — Telehealth: Payer: Self-pay

## 2019-06-05 NOTE — Telephone Encounter (Signed)
Called patient to do their pre-visit COVID screening.  Call went to voicemail. Unable to do prescreening.  

## 2019-06-06 ENCOUNTER — Encounter: Payer: Self-pay | Admitting: Family Medicine

## 2019-06-06 ENCOUNTER — Other Ambulatory Visit: Payer: Self-pay

## 2019-06-06 ENCOUNTER — Ambulatory Visit (INDEPENDENT_AMBULATORY_CARE_PROVIDER_SITE_OTHER): Payer: Medicare Other | Admitting: Family Medicine

## 2019-06-06 VITALS — BP 123/80 | HR 64 | Temp 97.3°F | Resp 17 | Ht 60.0 in | Wt 162.8 lb

## 2019-06-06 DIAGNOSIS — M3219 Other organ or system involvement in systemic lupus erythematosus: Secondary | ICD-10-CM

## 2019-06-06 DIAGNOSIS — G500 Trigeminal neuralgia: Secondary | ICD-10-CM

## 2019-06-06 DIAGNOSIS — G44091 Other trigeminal autonomic cephalgias (TAC), intractable: Secondary | ICD-10-CM

## 2019-06-06 DIAGNOSIS — M5412 Radiculopathy, cervical region: Secondary | ICD-10-CM

## 2019-06-06 MED ORDER — TOPIRAMATE 50 MG PO TABS: 50.0000 mg | ORAL_TABLET | Freq: Two times a day (BID) | ORAL | 2 refills | Status: DC

## 2019-06-06 MED ORDER — KETOROLAC TROMETHAMINE 30 MG/ML IJ SOLN
60.0000 mg | Freq: Once | INTRAMUSCULAR | Status: AC
  Administered 2019-06-06: 60 mg via INTRAMUSCULAR

## 2019-06-06 MED ORDER — GABAPENTIN 300 MG PO CAPS: 600.0000 mg | ORAL_CAPSULE | Freq: Every day | ORAL | 1 refills | Status: DC

## 2019-06-06 MED ORDER — TIZANIDINE HCL 4 MG PO TABS: 4.0000 mg | ORAL_TABLET | Freq: Three times a day (TID) | ORAL | 1 refills | Status: DC | PRN

## 2019-06-06 NOTE — Progress Notes (Signed)
States that she hasn't noticed any improvement in her headaches.  She has noticed some improvement in her neck pain.  Has been taking 2 capsules of the Gabapentin at bedtime

## 2019-06-06 NOTE — Progress Notes (Signed)
Subjective:  Patient ID: Brittney Maddox, female    DOB: September 07, 1966  Age: 53 y.o. MRN: [id number]  CC: Headache and Neck Pain   HPI Brittney Maddox is a 53 year old female with a history of Lupus seen for a follow up of neck stiffness and occipital headaches which have been present for the last 6 weeks. Headaches are nagging and radiate down her neck with associated stiffness to the point she was once unable to lift her head off the bed and needed her mom's help but had normal range of motion in extremities. Her range of motion is severely decreased in her neck and she has to turn her entire body for lateral vision. She feels pressure in her head and looking down feels like "everything joggles forward". There is associated shooting R ear pain which brings her to tears and has to stop and hold her ears. Unable to open her mouth to chew and has to slide in food sideways. Denies nausea, vomiting,photophobia blurry vision. Fioricet has been ineffective; she is also on Gabapentin and Tegretol.  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 No fracture or spondylolisthesis.  She has noticed loss of coordination with tremors in her R hand. No gait abnormalities. With regards to her Lupus I had referred her to Rheumatology but appointment is pending obtaining her medical records and I have provided her with this information and contact address of Rheumatology practice to coordinate this.    Past Medical History:  Diagnosis Date  . Allergy   . Asthma   . Lupus ([place])   . Myocardial infarction [facility])     Past Surgical History:  Procedure Laterality Date  . ABDOMINAL HYSTERECTOMY    . CESAREAN SECTION      Family History  Problem Relation Age of Onset  . Heart disease Mother   . Cancer Father     No Known Allergies  Outpatient Medications Prior to Visit  Medication Sig Dispense Refill  . butalbital-acetaminophen-caffeine (FIORICET) 50-325-40 MG tablet Take 1-2  tablets by mouth every 12 (twelve) hours as needed for headache. 30 tablet 0  . carbamazepine (TEGRETOL XR) 100 MG 12 hr tablet Take 1 tablet (100 mg total) by mouth 2 (two) times daily. 60 tablet 1  . hydroxychloroquine (PLAQUENIL) 200 MG tablet Take 200 mg by mouth 2 (two) times daily.    Marland Kitchen gabapentin (NEURONTIN) 300 MG capsule Take 1 capsule (300 mg total) by mouth at bedtime. (Patient taking differently: Take 600 mg by mouth at bedtime. ) 30 capsule 1  . tiZANidine (ZANAFLEX) 4 MG tablet Take 1 tablet (4 mg total) by mouth every 8 (eight) hours as needed for muscle spasms. 90 tablet 0   No facility-administered medications prior to visit.      ROS Review of Systems  Constitutional: Negative for activity change, appetite change and fatigue.  HENT: Negative for congestion, sinus pressure and sore throat.   Eyes: Negative for visual disturbance.  Respiratory: Negative for cough, chest tightness, shortness of breath and wheezing.   Cardiovascular: Negative for chest pain and palpitations.  Gastrointestinal: Negative for abdominal distention, abdominal pain and constipation.  Endocrine: Negative for polydipsia.  Genitourinary: Negative for dysuria and frequency.  Musculoskeletal: Positive for neck pain and neck stiffness. Negative for arthralgias and back pain.  Skin: Negative for rash.  Neurological: Positive for tremors and headaches. Negative for light-headedness and numbness.  Hematological: Does not bruise/bleed easily.  Psychiatric/Behavioral: Negative for agitation and behavioral problems.  Objective:  BP 123/80   Pulse 64   Temp (!) 97.3 F (36.3 C) (Temporal)   Resp 17   Ht 5' (1.524 m)   Wt 162 lb 12.8 oz (73.8 kg)   SpO2 98%   BMI 31.79 kg/m   BP/Weight 06/06/2019 05/08/2019 [id number]  Systolic BP [id number]  Diastolic BP 80 84 89  Wt. (Lbs) 162.8 - -  BMI 31.79 - -      Physical Exam Constitutional:      Appearance: She is well-developed.  Neck:      Musculoskeletal: Neck rigidity present.     Vascular: No JVD.  Cardiovascular:     Rate and Rhythm: Normal rate.     Heart sounds: Normal heart sounds. No murmur.  Pulmonary:     Effort: Pulmonary effort is normal.     Breath sounds: Normal breath sounds. No wheezing or rales.  Chest:     Chest wall: No tenderness.  Abdominal:     General: Bowel sounds are normal. There is no distension.     Palpations: Abdomen is soft. There is no mass.     Tenderness: There is no abdominal tenderness.  Musculoskeletal: Normal range of motion.     Right lower leg: No edema.     Left lower leg: No edema.  Lymphadenopathy:     Cervical: Cervical adenopathy present.  Neurological:     Mental Status: She is alert and oriented to person, place, and time.     Cranial Nerves: No facial asymmetry.     Sensory: No sensory deficit.     Coordination: Coordination abnormal (R hand; L is normal.).     Deep Tendon Reflexes: Reflexes normal.     Comments: Hand grip 4/5 bilaterally  Psychiatric:        Mood and Affect: Mood normal.     CMP Latest Ref Rng & Units 12/12/2013 08/26/2013 07/12/2013  Glucose 70 - 99 mg/dL 97 97 84  BUN 6 - 23 mg/dL 12 12 13   Creatinine 0.50 - 1.10 mg/dL 0.80 0.80 0.76  Sodium 137 - 147 mEq/L 141 140 140  Potassium 3.7 - 5.3 mEq/L 4.0 4.0 3.9  Chloride 96 - 112 mEq/L 104 107 106  CO2 19 - 32 mEq/L - 24 25  Calcium 8.4 - 10.5 mg/dL - 8.9 9.6  Total Protein 6.0 - 8.3 g/dL - - 7.4  Total Bilirubin 0.3 - 1.2 mg/dL - - 0.4  Alkaline Phos 39 - 117 U/L - - 137(H)  AST 0 - 37 U/L - - 49(H)  ALT 0 - 35 U/L - - 65(H)    Lipid Panel     Component Value Date/Time   CHOL 153 07/12/2013 1536   TRIG 55 07/12/2013 1536   HDL 65 07/12/2013 1536   CHOLHDL 2.4 07/12/2013 1536   VLDL 11 07/12/2013 1536   LDLCALC 77 07/12/2013 1536    CBC    Component Value Date/Time   WBC 3.4 (L) 12/21/2013 0005   RBC 4.47 12/21/2013 0005   HGB 13.2 12/21/2013 0005   HCT 39.8 12/21/2013 0005    PLT 197 12/21/2013 0005   MCV 89.0 12/21/2013 0005   MCH 29.5 12/21/2013 0005   MCHC 33.2 12/21/2013 0005   RDW 13.6 12/21/2013 0005   LYMPHSABS 2.0 07/12/2013 1536   MONOABS 0.3 07/12/2013 1536   EOSABS 0.1 07/12/2013 1536   BASOSABS 0.0 07/12/2013 1536    Lab Results  Component Value Date   HGBA1C 5.9 (H)  07/12/2013    Assessment & Plan:   1. Other intractable trigeminal autonomic cephalgia (TAC) Uncontrolled Commence Topamax for occipital headaches - atypical for migraines - Ambulatory referral to Neurology - topiramate (TOPAMAX) 50 MG tablet; Take 1 tablet (50 mg total) by mouth 2 (two) times daily.  Dispense: 60 tablet; Refill: 2 - CT SOFT TISSUE NECK W CONTRAST; Future - CT Head Wo Contrast; Future  2. Other systemic lupus erythematosus with other organ involvement [facility]) Currently on Plaquenil Provided phone # to Rheumatology Dr Estanislado Pandy so she can arrange for her old records to be sent there in order to secure an appointment.  3. Cervical radiculopathy Neck xray reveals osteoarthritis changes - Ambulatory referral to Neurology - Ambulatory referral to Physical Therapy - gabapentin (NEURONTIN) 300 MG capsule; Take 2 capsules (600 mg total) by mouth at bedtime.  Dispense: 60 capsule; Refill: 1 - tiZANidine (ZANAFLEX) 4 MG tablet; Take 1 tablet (4 mg total) by mouth every 8 (eight) hours as needed for muscle spasms.  Dispense: 90 tablet; Refill: 1 - ketorolac (TORADOL) 30 MG/ML injection 60 mg  4. Trigeminal neuralgia Uncontrolled on Tegretol - Ambulatory referral to Neurology    Meds ordered this encounter  Medications  . topiramate (TOPAMAX) 50 MG tablet    Sig: Take 1 tablet (50 mg total) by mouth 2 (two) times daily.    Dispense:  60 tablet    Refill:  2  . gabapentin (NEURONTIN) 300 MG capsule    Sig: Take 2 capsules (600 mg total) by mouth at bedtime.    Dispense:  60 capsule    Refill:  1  . tiZANidine (ZANAFLEX) 4 MG tablet    Sig: Take 1 tablet (4  mg total) by mouth every 8 (eight) hours as needed for muscle spasms.    Dispense:  90 tablet    Refill:  1  . ketorolac (TORADOL) 30 MG/ML injection 60 mg    Follow-up: Return in about 1 month (around 07/06/2019) for coordination of care.       Charlott Rakes, MD, FAAFP. [facility] and [facility], [place]   06/06/2019, 12:33 PM

## 2019-06-09 ENCOUNTER — Other Ambulatory Visit: Payer: Self-pay

## 2019-06-09 ENCOUNTER — Encounter (HOSPITAL_COMMUNITY): Payer: Self-pay | Admitting: Emergency Medicine

## 2019-06-09 ENCOUNTER — Emergency Department (HOSPITAL_COMMUNITY)
Admission: EM | Admit: 2019-06-09 | Discharge: 2019-06-10 | Discharge status: Against Medical Advice | Payer: Medicare Other | Attending: Emergency Medicine | Admitting: Emergency Medicine

## 2019-06-09 DIAGNOSIS — Z5321 Procedure and treatment not carried out due to patient leaving prior to being seen by health care provider: Secondary | ICD-10-CM | POA: Diagnosis not present

## 2019-06-09 DIAGNOSIS — H9201 Otalgia, right ear: Secondary | ICD-10-CM | POA: Insufficient documentation

## 2019-06-09 DIAGNOSIS — R51 Headache: Secondary | ICD-10-CM | POA: Diagnosis present

## 2019-06-09 LAB — CBC WITH DIFFERENTIAL/PLATELET
Abs Immature Granulocytes: 0 10*3/uL (ref 0.00–0.07)
Basophils Absolute: 0 10*3/uL (ref 0.0–0.1)
Basophils Relative: 1 %
Eosinophils Absolute: 0 10*3/uL (ref 0.0–0.5)
Eosinophils Relative: 1 %
HCT: 41.6 % (ref 36.0–46.0)
Hemoglobin: 13 g/dL (ref 12.0–15.0)
Immature Granulocytes: 0 %
Lymphocytes Relative: 48 %
Lymphs Abs: 1.9 10*3/uL (ref 0.7–4.0)
MCH: 29.8 pg (ref 26.0–34.0)
MCHC: 31.3 g/dL (ref 30.0–36.0)
MCV: 95.4 fL (ref 80.0–100.0)
Monocytes Absolute: 0.2 10*3/uL (ref 0.1–1.0)
Monocytes Relative: 6 %
Neutro Abs: 1.7 10*3/uL (ref 1.7–7.7)
Neutrophils Relative %: 44 %
Platelets: 264 10*3/uL (ref 150–400)
RBC: 4.36 MIL/uL (ref 3.87–5.11)
RDW: 13.6 % (ref 11.5–15.5)
WBC: 3.9 10*3/uL — ABNORMAL LOW (ref 4.0–10.5)
nRBC: 0 % (ref 0.0–0.2)

## 2019-06-09 LAB — BASIC METABOLIC PANEL
Anion gap: 10 (ref 5–15)
BUN: 12 mg/dL (ref 6–20)
CO2: 20 mmol/L — ABNORMAL LOW (ref 22–32)
Calcium: 9.5 mg/dL (ref 8.9–10.3)
Chloride: 106 mmol/L (ref 98–111)
Creatinine, Ser: 0.86 mg/dL (ref 0.44–1.00)
GFR calc Af Amer: >60 mL/min (ref >60)
GFR calc non Af Amer: >60 mL/min (ref >60)
Glucose, Bld: 97 mg/dL (ref 70–99)
Potassium: 3.8 mmol/L (ref 3.5–5.1)
Sodium: 136 mmol/L (ref 135–145)

## 2019-06-09 NOTE — ED Triage Notes (Signed)
Patient reports chronic headache for several months worse this week with right ear pain , pain radiating to neck and back of head , denies fever or head injury.

## 2019-06-10 NOTE — ED Notes (Signed)
No response in lobby to take to treatment room x 3

## 2019-06-11 ENCOUNTER — Ambulatory Visit (INDEPENDENT_AMBULATORY_CARE_PROVIDER_SITE_OTHER): Payer: Medicare Other | Admitting: Neurology

## 2019-06-11 ENCOUNTER — Encounter: Payer: Self-pay | Admitting: Neurology

## 2019-06-11 ENCOUNTER — Telehealth: Payer: Self-pay | Admitting: Neurology

## 2019-06-11 ENCOUNTER — Other Ambulatory Visit: Payer: Self-pay

## 2019-06-11 VITALS — BP 126/87 | HR 76 | Temp 98.4°F | Ht 60.0 in | Wt 154.0 lb

## 2019-06-11 DIAGNOSIS — M542 Cervicalgia: Secondary | ICD-10-CM

## 2019-06-11 DIAGNOSIS — R7303 Prediabetes: Secondary | ICD-10-CM

## 2019-06-11 DIAGNOSIS — R945 Abnormal results of liver function studies: Secondary | ICD-10-CM | POA: Diagnosis not present

## 2019-06-11 DIAGNOSIS — K760 Fatty (change of) liver, not elsewhere classified: Secondary | ICD-10-CM

## 2019-06-11 DIAGNOSIS — M3219 Other organ or system involvement in systemic lupus erythematosus: Secondary | ICD-10-CM | POA: Diagnosis not present

## 2019-06-11 DIAGNOSIS — G500 Trigeminal neuralgia: Secondary | ICD-10-CM | POA: Insufficient documentation

## 2019-06-11 DIAGNOSIS — R7989 Other specified abnormal findings of blood chemistry: Secondary | ICD-10-CM

## 2019-06-11 MED ORDER — CARBAMAZEPINE 200 MG PO TABS: 200.0000 mg | ORAL_TABLET | Freq: Three times a day (TID) | ORAL | 3 refills | Status: DC

## 2019-06-11 MED ORDER — PREDNISONE 10 MG PO TABS: ORAL_TABLET | ORAL | 0 refills | Status: DC

## 2019-06-11 NOTE — Telephone Encounter (Signed)
Medicare order sent to GI. No auth they will reach out to the patient to schedule.  

## 2019-06-11 NOTE — Progress Notes (Signed)
Provider:  Larey Seat, MD  Referring Provider: Charlott Rakes, MD Primary Care Physician:  Patient, No Pcp Per  Chief Complaint  Patient presents with  . New Patient (Initial Visit)    pt with daughter, rm 64.  pt states that she is having some pinching sensations in posterior head/ neck. she reports also   expressive aphasia, "everything effecting the right side of her head".  The  pain goes down on the right side of face.  feels like a shredding feeling in back of head. pain is so bad on right side and in jaw making it hard to eat.   . Other    unable to turn neck without having to turn her whole body.     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  She is seen here as a referral/ revisit from Dr. Margarita Rana for evaluation of  Occipital head pain, ear pain and neck stiffness, also word finding difficulties.  She reports a occipital headache so severe that she is sometimes in tears. Opening her mouth hurts, and affects eating and toothbrushing.   She also described being episodically weak, a degree of weakness that hindered her to rise form the bed ,  Couldn't reach the bathroom.  Has had trouble to open her eyes,   She appears frustrated whele trying to describe her symptoms.  She is demonsatrating part of the conversation that she isn't fitting into words, like a charade.   Her speech varied in clarity and fluency while we discuss the chief complaint. Likely functional.   Review of Systems: Out of a complete 14 system review, the patient complains of only the following symptoms, and all other reviewed systems are negative.   PAIN , neuralgic pain without evidence of herpetic infection.  No history of shingles in the face, neck.   Deliberately slow moving, slurred speech, painfull expression/ grimassing.. Didn't answer questions while examined, than acted surprised I had asked..   Social History   Socioeconomic History  . Marital status:  Single    Spouse name: Not on file  . Number of children: 5  . Years of education: Not on file  . Highest education level: Not on file  Occupational History  . Not on file  Social Needs  . Financial resource strain: Not on file  . Food insecurity    Worry: Not on file    Inability: Not on file  . Transportation needs    Medical: Not on file    Non-medical: Not on file  Tobacco Use  . Smoking status: Never Smoker  . Smokeless tobacco: Never Used  Substance and Sexual Activity  . Alcohol use: No  . Drug use: No  . Sexual activity: Yes    Birth control/protection: Surgical  Lifestyle  . Physical activity    Days per week: Not on file    Minutes per session: Not on file  . Stress: Not on file  Relationships  . Social Herbalist on phone: Not on file    Gets together: Not on file    Attends religious service: Not on file    Active member of club or organization: Not on file    Attends meetings of clubs or organizations: Not on file    Relationship status: Not on file  . Intimate partner violence    Fear of current or ex partner: Not on file  Emotionally abused: Not on file    Physically abused: Not on file    Forced sexual activity: Not on file  Other Topics Concern  . Not on file  Social History Narrative  . Not on file    Family History  Problem Relation Age of Onset  . Heart disease Mother   . Osteoporosis Mother   . Prostate cancer Father   . Aneurysm Father   . Diabetes Father   . Migraines Father     Past Medical History:  Diagnosis Date  . Allergy   . Asthma   . Lupus ([place])   . Myocardial infarction ([place])   . Rheumatoid arthritis ([place])   . Trigeminal neuralgia     Past Surgical History:  Procedure Laterality Date  . ABDOMINAL HYSTERECTOMY    . CESAREAN SECTION    . NERVE AND TENDON REPAIR Right   . ROTATOR CUFF REPAIR Left     Current Outpatient Medications  Medication Sig Dispense Refill  . butalbital-acetaminophen-caffeine  (FIORICET) 50-325-40 MG tablet Take 1-2 tablets by mouth every 12 (twelve) hours as needed for headache. 30 tablet 0  . carbamazepine (TEGRETOL XR) 100 MG 12 hr tablet Take 1 tablet (100 mg total) by mouth 2 (two) times daily. 60 tablet 1  . gabapentin (NEURONTIN) 300 MG capsule Take 2 capsules (600 mg total) by mouth at bedtime. 60 capsule 1  . hydroxychloroquine (PLAQUENIL) 200 MG tablet Take 200 mg by mouth 2 (two) times daily.    Marland Kitchen tiZANidine (ZANAFLEX) 4 MG tablet Take 1 tablet (4 mg total) by mouth every 8 (eight) hours as needed for muscle spasms. 90 tablet 1  . topiramate (TOPAMAX) 50 MG tablet Take 1 tablet (50 mg total) by mouth 2 (two) times daily. 60 tablet 2   No current facility-administered medications for this visit.     Allergies as of 06/11/2019  . (No Known Allergies)    Vitals: BP 126/87   Pulse 76   Temp 98.4 F (36.9 C)   Ht 5' (1.524 m)   Wt 154 lb (69.9 kg)   BMI 30.08 kg/m  Last Weight:  Wt Readings from Last 1 Encounters:  06/11/19 154 lb (69.9 kg)   Last Height:   Ht Readings from Last 1 Encounters:  06/11/19 5' (1.524 m)    Physical exam:  General: The patient is awake, alert and appears not in acute distress. The patient is well groomed. Head: Normocephalic, atraumatic. Neck is supple. Mallampati 4, neck circumference:q14 Cardiovascular:  Regular rate and rhythm , without  murmurs or carotid bruit, and without distended neck veins. Respiratory: Lungs are clear to auscultation. Skin:  Without evidence of edema, or rash Trunk: BMI 30- elevated and patient  has normal posture.  Neurologic exam : The patient is awake, but appears not fully alert , oriented to place and time.  Memory subjective  described as impaired. There is an abnormal attention span & concentration ability.  Speech is non fluent with slurring, dysphonia and mid sentence interruption of work flow-  Mood and affect are dramatic/ changing during interview and exam.  Cranial  nerves: Pupils are equal and briskly reactive to light. Funduscopic exam without  evidence of pallor or edema. Extraocular movements  in vertical and horizontal planes intact and without nystagmus.  Visual fields by finger perimetry are intact.  Hearing to finger rub intact. Otoscope shows waxy built up, no blisters.   Facial sensation intact to fine touch. Facial motor strength is symmetric and  tongue and uvula move midline. Tongue protrusion into either cheek is normal. Shoulder shrug is normal.   Motor exam:  Normal tone ,muscle bulk and symmetric strength in all extremities. There is no gripstrength loss, but she doesn't provide full grip strength,   Sensory:  Fine touch, pinprick and vibration were tested in all extremities. Proprioception was normal.  Coordination: slowing of  alternating movements in the fingers/hands l. Finger-to-nose maneuver  normal without evidence of ataxia, dysmetria or tremor.but very slow(!)  Gait and station: Patient walks without assistive device and is able unassisted to climb up to the exam table. Strength within normal limits. Stance is stable and normal. Tandem gait deferred.   Deep tendon reflexes: in the upper and lower extremities are symmetric and intact. Babinski maneuver response is  downgoing.   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   Plan:  Treatment plan and additional workup :  Agree with amitriptyline, Neurontin. She was given tegretol by her PCP on 06-06-2019  and it was d/c in the ER on 06-09-2019. , but only took it for 3 days. Not enough time.  she had been on 50 mg daily fro 14 days - currently on none ? Went from 50 to 0 mg - it did help, she feels.  Added steroid taper, 30  mg in AM for 3 days, followed by 20 mg in AM for 3 days and 10 mg in AM for 3 days.  MRI neck and brain . C/ s contrast for lupus patients.   Steroid watch for hepatic function, pre-diabetis.   Reschedule in 4 weeks for labs, review of images and possible nerve block.   Asencion Partridge Niveah Boerner MD 06/11/2019

## 2019-06-11 NOTE — Patient Instructions (Signed)
Occipital Nerve Block Patient Information  Description: The occipital nerves originate in the cervical (neck) spinal cord and travel upward through muscle and tissue to supply sensation to the back of the head and top of the scalp.  In addition, the nerves control some of the muscles of the scalp.  Occipital neuralgia is an irritation of these nerves which can cause headaches, numbness of the scalp, and neck discomfort.     The occipital nerve block will interrupt nerve transmission through these nerves and can relieve pain and spasm.  The block consists of insertion of a small needle under the skin in the back of the head to deposit local anesthetic (numbing medicine) and/or steroids around the nerve.  The entire block usually lasts less than 5 minutes.  Conditions which may be treated by occipital blocks:   Muscular pain and spasm of the scalp  Nerve irritation, back of the head  Headaches  Upper neck pain  Preparation for the injection:  1. Do not eat any solid food or dairy products within 8 hours of your appointment. 2. You may drink clear liquids up to 3 hours before appointment.  Clear liquids include water, black coffee, juice or soda.  No milk or cream please. 3. You may take your regular medication, including pain medications, with a sip of water before you appointment.  Diabetics should hold regular insulin (if taken separately) and take 1/2 normal NPH dose the morning of the procedure.  Carry some sugar containing items with you to your appointment. 4. A driver must accompany you and be prepared to drive you home after your procedure. 5. Bring all your current medications with you. 6. An IV may be inserted and sedation may be given at the discretion of the physician. 7. A blood pressure cuff, EKG, and other monitors will often be applied during the procedure.  Some patients may need to have extra oxygen administered for a short period. 8. You will be asked to provide medical  information, including your allergies and medications, prior to the procedure.  We must know immediately if you are taking blood thinners (like Coumadin/Warfarin) or if you are allergic to IV iodine contrast (dye).  We must know if you could possible be pregnant.  9. Do not wear a high collared shirt or turtleneck.  Tie long hair up in the back if possible.  Possible side-effects:   Bleeding from needle site  Infection (rare, may require surgery)  Nerve injury (rare)  Hair on back of neck can be tinged with iodine scrub (this will wash out)  Light-headedness (temporary)  Pain at injection site (several days)  Decreased blood pressure (rare, temporary)  Seizure (very rare)  Call if you experience:   Hives or difficulty breathing ( go to the emergency room)  Inflammation or drainage at the injection site(s)  Please note:  Although the local anesthetic injected can often make your painful muscles or headache feel good for several hours after the injection, the pain may return.  It takes 3-7 days for steroids to work.  You may not notice any pain relief for at least one week.  If effective, we will often do a series of injections spaced 3-6 weeks apart to maximally decrease your pain.  If you have any questions, please call [phone] [facility] Occipital Neuralgia  Occipital neuralgia is a type of headache that causes brief episodes of very bad pain in the back of your head. Pain from occipital  neuralgia may spread (radiate) to other parts of your head. These headaches may be caused by irritation of the nerves that leave your spinal cord high up in your neck, just below the base of your skull (occipital nerves). Your occipital nerves transmit sensations from the back of your head, the top of your head, and the areas behind your ears. What are the causes? This condition can occur without any known cause (primary headache syndrome). In other  cases, this condition is caused by pressure on or irritation of one of the two occipital nerves. Pressure and irritation may be due to:  Muscle spasm in the neck.  Neck injury.  Wear and tear of the vertebrae in the neck (osteoarthritis).  Disease of the disks that separate the vertebrae.  Swollen blood vessels that put pressure on the occipital nerves.  Infections.  Tumors.  Diabetes. What are the signs or symptoms? This condition causes brief burning, stabbing, electric, shocking, or shooting pain which can radiate to the top of the head. It can happen on one side or both sides of the head. It can also cause:  Pain behind the eye.  Pain triggered by neck movement or hair brushing.  Scalp tenderness.  Aching in the back of the head between episodes of very bad pain.  Pain gets worse with exposure to bright lights. How is this diagnosed? There is no test that diagnoses this condition. Your health care provider may diagnose this condition based on a physical exam and your symptoms. Other tests may be done, such as:  Imaging studies of the brain and neck (cervical spine), such as an MRI or CT scan. These look for causes of pinched nerves.  Applying pressure to the nerves in the neck to try to re-create the pain.  Injection of numbing medicine into the occipital nerve areas to see if pain goes away (diagnostic nerve block). How is this treated? Treatment for this condition may begin with simple measures, such as:  Rest.  Massage.  Applying heat or cold on the area.  Over-the-counter pain relievers. If these measures do not work, you may need other treatments, including:  Medicines, such as: ? Prescription-strength anti-inflammatory medicines. ? Muscle relaxants. ? Anti-seizure medicines, which can relieve pain. ? Antidepressants, which can relieve pain. ? Injected medicines, such as medicines that numb the area (local anesthetic) and steroids.  Pulsed  radiofrequency ablation. This is when wires are implanted to deliver electrical impulses that block pain signals from the occipital nerve.  Surgery to relieve nerve pressure.  Physical therapy. Follow these instructions at home: Pain management      Avoid any activities that cause pain.  Rest when you have an attack of pain.  Try gentle massage to relieve pain.  Try a different pillow or sleeping position.  If directed, apply heat to the affected area as told by your health care provider. Use the heat source that your health care provider recommends, such as a moist heat pack or a heating pad. ? Place a towel between your skin and the heat source. ? Leave the heat on for 20-30 minutes. ? Remove the heat if your skin turns bright red. This is especially important if you are unable to feel pain, heat, or cold. You may have a greater risk of getting burned.  If directed, apply ice to the back of the head and neck area as told by your health care provider. ? Put ice in a plastic bag. ? Place a towel between  your skin and the bag. ? Leave the ice on for 20 minutes, 2-3 times per day. General instructions  Take over-the-counter and prescription medicines only as told by your health care provider.  Avoid things that make your symptoms worse, such as bright lights.  Try to stay active. Get regular exercise that does not cause pain. Ask your health care provider to suggest safe exercises for you.  Work with a physical therapist to learn stretching exercises you can do at home.  Practice good posture.  Keep all follow-up visits as told by your health care provider. This is important. Contact a health care provider if:  Your medicine is not working.  You have new or worsening symptoms. Get help right away if:  You have very bad head pain that does not go away.  You have a sudden change in vision, balance, or speech. Summary  Occipital neuralgia is a type of headache that  causes brief episodes of very bad pain in the back of your head.  Pain from occipital neuralgia may spread (radiate) to other parts of your head.  Treatment for this condition includes rest, massage, and medicines. This information is not intended to replace advice given to you by your health care provider. Make sure you discuss any questions you have with your health care provider. Document Released: 08/23/2001 Document Revised: 08/15/2017 Document Reviewed: 11/03/2016 Elsevier Patient Education  2020 [employer].

## 2019-06-12 ENCOUNTER — Telehealth: Payer: Self-pay | Admitting: *Deleted

## 2019-06-12 NOTE — Telephone Encounter (Signed)
Pt daughter called, pt need a note stating that she is unable to return to work. Please call  [phone] or [phone]

## 2019-06-12 NOTE — Telephone Encounter (Signed)
Call the patient's daughter back and wanted to get clarification if the note was for the patient or for the daughter. The note is for the patient. 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 She is set up for MRI's on 06/29/2019 and occipital nerve block on 07/03/2019 with Ward Givens, NP. Advised I would have to make sure Dr Brett Fairy was ok and if so I would write and completed the note based off what she said and send the letter through mychart once the patient activates that mychart. Patients daughter verbalized understanding and was appreciative.

## 2019-06-13 ENCOUNTER — Encounter: Payer: Self-pay | Admitting: Neurology

## 2019-06-13 NOTE — Telephone Encounter (Signed)
Letter written and sent to pt via mychart

## 2019-06-13 NOTE — Telephone Encounter (Signed)
I didn't know she needed a work excuse, yes she can be out until 2 days after MRI and nerve blocks.  so we can respond to the results.

## 2019-06-17 NOTE — Progress Notes (Signed)
Office Visit Note  Patient: Brittney Maddox             Date of Birth: Feb 08, 1966           MRN: [id number]             PCP: Patient, No Pcp Per Referring: Charlott Rakes, MD Visit Date: 07/01/2019 Occupation: @GUAROCC @  Subjective:  Pain in knees and right ankle.   History of Present Illness: Brittney Maddox is a 53 y.o. female seen in consultation per request of her PCP.  According to patient her symptoms are started in year 2000 while she was living in [place] with joint pain and hair loss and fatigue.  She states her initial labs are normal.  She was referred to rheumatologist and was not restarted on any medications for 2 years.  When her labs came positive she was started on Plaquenil and prednisone.  She states she took prednisone for many years and also took Plaquenil and did quite well.  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  She recalls having intermittent elevated LFTs.  She does not recall when she stopped prednisone.  She has been taking Plaquenil intermittently.  In June 2020 she moved to [place].  She has been under care of Dr. Margarita Rana.  She states she was experiencing severe headaches few months back and had extensive work-up including CT head MRI of her brain.  She states her Covid test was negative.  She was diagnosed with trigeminal neuralgia by Dr. Brett Fairy.  She states for the last 2 weeks she is unable to walk due to severe knee joint pain in the right lower extremity pain.  She also has some lower back pain.  She states she is unable to drive and her daughter has been driving her around.  She states her daughter dropped her here and will be back in 2 hours.  Patient is a poor historian.  She has difficulty recalling her history.  It took me almost 35 minutes to get her history.  Activities of Daily Living:  Patient reports morning stiffness for 24 hours.   Patient Reports nocturnal pain.  Difficulty dressing/grooming:  Reports Difficulty climbing stairs: Reports Difficulty getting out of chair: Reports Difficulty using hands for taps, buttons, cutlery, and/or writing: Denies  Review of Systems  Constitutional: Positive for fatigue. Negative for night sweats, weight gain and weight loss.  HENT: Positive for mouth dryness. Negative for mouth sores, trouble swallowing, trouble swallowing and nose dryness.   Eyes: Negative for pain, redness, itching, visual disturbance and dryness.  Respiratory: Negative for cough, shortness of breath and difficulty breathing.   Cardiovascular: Positive for swelling in legs/feet. Negative for chest pain, palpitations, hypertension and irregular heartbeat.  Gastrointestinal: Negative for blood in stool, constipation and diarrhea.  Endocrine: Negative for increased urination.  Genitourinary: Negative for difficulty urinating, painful urination and vaginal dryness.  Musculoskeletal: Positive for arthralgias, joint pain, joint swelling, muscle weakness, morning stiffness and muscle tenderness. Negative for myalgias and myalgias.  Skin: Negative for color change, rash, hair loss, skin tightness, ulcers and sensitivity to sunlight.  Allergic/Immunologic: Negative for susceptible to infections.  Neurological: Positive for numbness, headaches, memory loss and weakness. Negative for dizziness and night sweats.  Hematological: Negative for swollen glands.  Psychiatric/Behavioral: Positive for confusion and sleep disturbance. Negative for depressed mood. The patient is not nervous/anxious.     PMFS History:  Patient Active Problem List  Diagnosis Date Noted  . Cervicalgia 06/11/2019  . Right trigeminal neuralgia 06/11/2019  . Prediabetes 08/20/2013  . Morbid obesity ([place]) 08/20/2013  . Elevated LFTs 08/20/2013  . Fatty liver 08/20/2013  . Vaginitis and vulvovaginitis 07/12/2013  . 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 Lupus (systemic lupus erythematosus) ([place]) 06/24/2013    Past Medical  History:  Diagnosis Date  . Allergy   . Asthma   . Lupus ([place])   . Myocardial infarction ([place])   . Rheumatoid arthritis ([place])   . Trigeminal neuralgia     Family History  Problem Relation Age of Onset  . Heart disease Mother   . Osteoporosis Mother   . Prostate cancer Father   . Aneurysm Father   . Diabetes Father   . Migraines Father   . Healthy Daughter   . Healthy Daughter   . Healthy Daughter   . Healthy Daughter   . Healthy Son   . Diabetes Brother    Past Surgical History:  Procedure Laterality Date  . ABDOMINAL HYSTERECTOMY    . CESAREAN SECTION    . NERVE AND TENDON REPAIR Right   . ROTATOR CUFF REPAIR Left    Social History   Social History Narrative  . Not on file   Immunization History  Administered Date(s) Administered  . Influenza,inj,Quad PF,6+ Mos 07/12/2013     Objective: Vital Signs: BP 105/73 (BP Location: Left Arm, Patient Position: Sitting, Cuff Size: Normal)   Pulse 82   Resp 13   Ht 5' (1.524 m) Comment: per patient, patient in wheelchair.  Wt 140 lb (63.5 kg) Comment: per patient, patient in wheelchair  BMI 27.34 kg/m    Physical Exam Vitals signs and nursing note reviewed. Chaperone present: wheel chair.  Constitutional:      Appearance: She is well-developed.  HENT:     Head: Normocephalic and atraumatic.  Eyes:     Conjunctiva/sclera: Conjunctivae normal.  Neck:     Musculoskeletal: Normal range of motion.  Cardiovascular:     Rate and Rhythm: Normal rate and regular rhythm.     Heart sounds: Normal heart sounds.  Pulmonary:     Effort: Pulmonary effort is normal.     Breath sounds: Normal breath sounds.  Abdominal:     General: Bowel sounds are normal.     Palpations: Abdomen is soft.  Lymphadenopathy:     Cervical: No cervical adenopathy.  Skin:    General: Skin is warm and dry.     Capillary Refill: Capillary refill takes less than 2 seconds.  Neurological:     Mental Status: She is alert and oriented to person,  place, and time.     Motor: No weakness.     Comments: She is in the wheelchair.  Psychiatric:        Behavior: Behavior normal.      Musculoskeletal Exam: C-spine limited range of motion.  Lumbar spine was difficult to assess due to limited mobility.  Shoulder joints elbow joints wrist joint MCPs and PIPs were in good range of motion with no synovitis.  In the sitting position she had warmth on palpation of her bilateral knee joints.  Her right ankle joint appears swollen.  She also had tenderness across her midfoot.  CDAI Exam: CDAI Score: - Patient Global: -; Provider Global: - Swollen: -; Tender: - Joint Exam   No joint exam has been documented for this visit   There is currently no information documented on the homunculus. Go to the Rheumatology activity  and complete the homunculus joint exam.  Investigation: No additional findings.  Imaging: No results found.  Recent Labs: Lab Results  Component Value Date   WBC 3.9 (L) 06/09/2019   HGB 13.0 06/09/2019   PLT 264 06/09/2019   NA 136 06/09/2019   K 3.8 06/09/2019   CL 106 06/09/2019   CO2 20 (L) 06/09/2019   GLUCOSE 97 06/09/2019   BUN 12 06/09/2019   CREATININE 0.86 06/09/2019   BILITOT 0.4 07/12/2013   ALKPHOS 137 (H) 07/12/2013   AST 49 (H) 07/12/2013   ALT 65 (H) 07/12/2013   PROT 7.4 07/12/2013   ALBUMIN 4.1 07/12/2013   CALCIUM 9.5 06/09/2019   GFRAA >60 06/09/2019    Speciality Comments: No specialty comments available.  Procedures:  No procedures performed Allergies: Patient has no known allergies.   Assessment / Plan:     Visit Diagnoses: Other organ or system involvement in systemic lupus erythematosus ([place]) -patient states that she was diagnosed with lupus several years ago by her rheumatologist.  She has been on prednisone long-term and also Plaquenil.  She states her treatment was intermittent as she was not focusing on her health.  She does not recall when she stopped prednisone.  She states  she was given a recent prednisone taper by Dr. Bjorn Loser for trigeminal neuralgia.  She recalls having elevated LFTs in the past.  She does not recall having kidney involvement of brain involvement.- Plan: Urinalysis, Routine w reflex microscopic, Sedimentation rate, Rheumatoid factor, Cyclic citrul peptide antibody, IgG, 14-3-3 eta Protein, ANA, Anti-scleroderma antibody, RNP Antibody, Anti-Smith antibody, Sjogrens syndrome-A extractable nuclear antibody, Sjogrens syndrome-B extractable nuclear antibody, Anti-DNA antibody, double-stranded, C3 and C4, Beta-2 glycoprotein antibodies, Cardiolipin antibodies, IgG, IgM, IgA, Lupus Anticoagulant Eval w/Reflex.  I will get consent on Plaquenil.  She was also advised to get eye examination.  High risk medication use - Plaquenil 200 mg 1 tablet BID, prednisone - Plan: CBC with Differential/Platelet, COMPLETE METABOLIC PANEL WITH GFR, Glucose 6 phosphate dehydrogenase.  Have uncertain when patient had her last eye examination.  Pain in both hands -she gives history of bilateral hand discomfort.  No warmth or synovitis was noted.  Plan: XR Hand 2 View Right, XR Hand 2 View Left.  X-ray of bilateral hands were unremarkable.  Acute pain of both knees -she has warmth and swelling in her bilateral knee joints.  Plan: XR KNEE 3 VIEW RIGHT, XR KNEE 3 VIEW LEFT.  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  Pain in right ankle and joints of right foot -she has swelling in her right ankle joint and discomfort in the right foot.  Plan: XR Ankle 2 Views Right, XR Foot 2 Views Right.  The x-ray of ankle and the foot was unremarkable except for soft tissue swelling.  She has significant swelling in her right ankle joint and having difficulty walking.  I will put her on prednisone 20 mg p.o. daily and taper by 5 mg every 2 weeks.  Long term (current) use of systemic steroids-patient states that she has used prednisone for many years.  I do not have any of those  records to confirm.  She was also given a recent steroid taper for trigeminal neuralgia.  It is difficult to find out if she was taking the steroids when she moved to [place].  Other fatigue - Plan: CBC with Differential/Platelet, CK, TSH  Right trigeminal neuralgia-followed by Dr. Brett Fairy.  Memory loss-patient has difficulty recalling her history.  She also has  difficulty with word finding.  I am uncertain how long she has had this problem.  She was not accompanied by her family members.  She was seen by Dr. Brett Fairy recently.  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  Fatty liver  Elevated LFTs  Lipomatosis  Prediabetes  Orders: Orders Placed This Encounter  Procedures  . XR Hand 2 View Right  . XR Hand 2 View Left  . XR KNEE 3 VIEW RIGHT  . XR KNEE 3 VIEW LEFT  . XR Ankle 2 Views Right  . XR Foot 2 Views Right  . CBC with Differential/Platelet  . COMPLETE METABOLIC PANEL WITH GFR  . Urinalysis, Routine w reflex microscopic  . CK  . TSH  . Sedimentation rate  . Rheumatoid factor  . Cyclic citrul peptide antibody, IgG  . 14-3-3 eta Protein  . ANA  . Anti-scleroderma antibody  . RNP Antibody  . Anti-Smith antibody  . Sjogrens syndrome-A extractable nuclear antibody  . Sjogrens syndrome-B extractable nuclear antibody  . Anti-DNA antibody, double-stranded  . C3 and C4  . Beta-2 glycoprotein antibodies  . Cardiolipin antibodies, IgG, IgM, IgA  . Lupus Anticoagulant Eval w/Reflex  . Glucose 6 phosphate dehydrogenase   No orders of the defined types were placed in this encounter.   Face-to-face time spent with patient was 90 minutes. Greater than 50% of time was spent in counseling and coordination of care.  Follow-Up Instructions: Return in about 2 weeks (around 07/15/2019) for SLE.   Bo Merino, MD  Note - This record has been created using Editor, commissioning.  Chart creation errors  have been sought, but may not always  have been located. Such creation errors do not reflect on  the standard of medical care.

## 2019-06-18 ENCOUNTER — Ambulatory Visit (HOSPITAL_COMMUNITY): Payer: Medicare Other

## 2019-06-18 ENCOUNTER — Other Ambulatory Visit: Payer: Self-pay | Admitting: Neurology

## 2019-06-23 ENCOUNTER — Other Ambulatory Visit: Payer: Self-pay | Admitting: Neurology

## 2019-06-23 ENCOUNTER — Other Ambulatory Visit: Payer: Self-pay | Admitting: Family Medicine

## 2019-06-23 DIAGNOSIS — M5412 Radiculopathy, cervical region: Secondary | ICD-10-CM

## 2019-06-29 ENCOUNTER — Other Ambulatory Visit: Payer: Medicare Other

## 2019-07-01 ENCOUNTER — Ambulatory Visit (INDEPENDENT_AMBULATORY_CARE_PROVIDER_SITE_OTHER): Payer: Medicare Other

## 2019-07-01 ENCOUNTER — Ambulatory Visit: Payer: Self-pay

## 2019-07-01 ENCOUNTER — Encounter: Payer: Self-pay | Admitting: Rheumatology

## 2019-07-01 ENCOUNTER — Other Ambulatory Visit: Payer: Self-pay

## 2019-07-01 ENCOUNTER — Ambulatory Visit (INDEPENDENT_AMBULATORY_CARE_PROVIDER_SITE_OTHER): Payer: Medicare Other | Admitting: Rheumatology

## 2019-07-01 VITALS — BP 105/73 | HR 82 | Resp 13 | Ht 60.0 in | Wt 140.0 lb

## 2019-07-01 DIAGNOSIS — M25561 Pain in right knee: Secondary | ICD-10-CM

## 2019-07-01 DIAGNOSIS — R7989 Other specified abnormal findings of blood chemistry: Secondary | ICD-10-CM

## 2019-07-01 DIAGNOSIS — M79641 Pain in right hand: Secondary | ICD-10-CM

## 2019-07-01 DIAGNOSIS — R7303 Prediabetes: Secondary | ICD-10-CM

## 2019-07-01 DIAGNOSIS — Z7952 Long term (current) use of systemic steroids: Secondary | ICD-10-CM

## 2019-07-01 DIAGNOSIS — M25571 Pain in right ankle and joints of right foot: Secondary | ICD-10-CM

## 2019-07-01 DIAGNOSIS — Z79899 Other long term (current) drug therapy: Secondary | ICD-10-CM

## 2019-07-01 DIAGNOSIS — G500 Trigeminal neuralgia: Secondary | ICD-10-CM

## 2019-07-01 DIAGNOSIS — E882 Lipomatosis, not elsewhere classified: Secondary | ICD-10-CM

## 2019-07-01 DIAGNOSIS — M25562 Pain in left knee: Secondary | ICD-10-CM

## 2019-07-01 DIAGNOSIS — R413 Other amnesia: Secondary | ICD-10-CM

## 2019-07-01 DIAGNOSIS — R5383 Other fatigue: Secondary | ICD-10-CM

## 2019-07-01 DIAGNOSIS — M542 Cervicalgia: Secondary | ICD-10-CM

## 2019-07-01 DIAGNOSIS — M79642 Pain in left hand: Secondary | ICD-10-CM | POA: Diagnosis not present

## 2019-07-01 DIAGNOSIS — M3219 Other organ or system involvement in systemic lupus erythematosus: Secondary | ICD-10-CM

## 2019-07-01 DIAGNOSIS — K760 Fatty (change of) liver, not elsewhere classified: Secondary | ICD-10-CM

## 2019-07-01 MED ORDER — HYDROXYCHLOROQUINE SULFATE 200 MG PO TABS: 200.0000 mg | ORAL_TABLET | Freq: Two times a day (BID) | ORAL | 0 refills | Status: DC

## 2019-07-01 MED ORDER — PREDNISONE 5 MG PO TABS: ORAL_TABLET | ORAL | 0 refills | Status: DC

## 2019-07-01 NOTE — Progress Notes (Signed)
Pharmacy Note  Subjective: Patient presents today to the [facility] to see Dr. Estanislado Pandy.  Patient seen by the pharmacist for counseling on hydroxychloroquine systemic lupus erythematosus.  She has been on Plaquenil in the past prescribed by another provider and tolerated well.  Objective: CMP     Component Value Date/Time   NA 136 06/09/2019 2046   K 3.8 06/09/2019 2046   CL 106 06/09/2019 2046   CO2 20 (L) 06/09/2019 2046   GLUCOSE 97 06/09/2019 2046   BUN 12 06/09/2019 2046   CREATININE 0.86 06/09/2019 2046   CREATININE 0.76 07/12/2013 1536   CALCIUM 9.5 06/09/2019 2046   PROT 7.4 07/12/2013 1536   ALBUMIN 4.1 07/12/2013 1536   AST 49 (H) 07/12/2013 1536   ALT 65 (H) 07/12/2013 1536   ALKPHOS 137 (H) 07/12/2013 1536   BILITOT 0.4 07/12/2013 1536   GFRNONAA >60 06/09/2019 2046   GFRAA >60 06/09/2019 2046    CBC    Component Value Date/Time   WBC 3.9 (L) 06/09/2019 2046   RBC 4.36 06/09/2019 2046   HGB 13.0 06/09/2019 2046   HCT 41.6 06/09/2019 2046   PLT 264 06/09/2019 2046   MCV 95.4 06/09/2019 2046   MCH 29.8 06/09/2019 2046   MCHC 31.3 06/09/2019 2046   RDW 13.6 06/09/2019 2046   LYMPHSABS 1.9 06/09/2019 2046   MONOABS 0.2 06/09/2019 2046   EOSABS 0.0 06/09/2019 2046   BASOSABS 0.0 06/09/2019 2046    Assessment/Plan: Patient was counseled on the purpose, proper use, and adverse effects of hydroxychloroquine including nausea/diarrhea, skin rash, headaches, and sun sensitivity.  Discussed importance of annual eye exams while on hydroxychloroquine to monitor to ocular toxicity and discussed importance of frequent laboratory monitoring.  Provided patient with eye exam form for baseline ophthalmologic exam and standing lab instructions.  Provided patient with educational materials on hydroxychloroquine and answered all questions.  Patient consented to hydroxychloroquine.  Will upload consent in the media tab.    Dose will be Plaquenil 200 mg twice  daily.  All questions encouraged and answered.  Instructed patient to call with any questions or concerns.  Mariella Saa, PharmD, [place], [facility] Pharmacist [phone]  07/01/2019 11:19 AM

## 2019-07-01 NOTE — Addendum Note (Signed)
Addended by: Earnestine Mealing on: 07/01/2019 02:52 PM   Modules accepted: Orders

## 2019-07-01 NOTE — Patient Instructions (Addendum)
   Continue Plaquenil 200 mg 1 tablet twice daily.  Start prednisone taper.  Take 20 mg (4 tablets) daily for 14 days and then take 15 mg (3 tablets) for 14 days.  Please call the office for next refill.

## 2019-07-02 NOTE — Progress Notes (Signed)
Office Visit Note  Patient: Brittney Maddox             Date of Birth: 1965/11/22           MRN: [id number]             PCP: Patient, No Pcp Per Referring: No ref. provider found Visit Date: 07/16/2019 Occupation: _0 @  Subjective:  Discuss lab work     History of Present Illness: Brittney Maddox is a 53 y.o. female with history of systemic lupus erythematosus.  She is taking plaquenil 200 mg 1 tablet by mouth twice daily. She is currently taking a prednisone taper, which she started after her initial visit on 07/01/19 starting at 20 mg tapering by 5 mg every 2 weeks.  She is still taking prednisone 15 mg daily. She has persistent pain and stiffness in both knee joints and both feet.  She is waking with a walker currently. She states she experiences nocturnal pain, which causes interrupted sleep at night.   She denies any recent rashes and Raynaud's.  She wears sun protective clothing due to photosensitivity. She denies any sores in her mouth or nose.  She has chronic sicca symptoms.   Activities of Daily Living:  Patient reports joint stiffness all day  Patient Reports nocturnal pain.  Difficulty dressing/grooming: Reports Difficulty climbing stairs: Reports Difficulty getting out of chair: Reports Difficulty using hands for taps, buttons, cutlery, and/or writing: Denies  Review of Systems  Constitutional: Positive for fatigue.  HENT: Positive for mouth dryness. Negative for mouth sores and nose dryness.   Eyes: Positive for dryness. Negative for pain and visual disturbance.  Respiratory: Negative for cough, hemoptysis, shortness of breath and difficulty breathing.   Cardiovascular: Negative for chest pain, palpitations and hypertension.  Gastrointestinal: Negative for blood in stool, constipation and diarrhea.  Endocrine: Negative for excessive thirst and increased urination.  Genitourinary: Negative for difficulty urinating and painful urination.  Musculoskeletal: Positive  for arthralgias, joint pain, joint swelling, morning stiffness and muscle tenderness. Negative for myalgias, muscle weakness and myalgias.  Skin: Negative for color change, pallor, rash, hair loss, nodules/bumps, skin tightness, ulcers and sensitivity to sunlight.  Allergic/Immunologic: Negative for susceptible to infections.  Neurological: Negative for numbness, headaches and weakness.  Hematological: Negative for swollen glands.  Psychiatric/Behavioral: Positive for sleep disturbance. Negative for depressed mood. The patient is not nervous/anxious.     PMFS History:  Patient Active Problem List   Diagnosis Date Noted  . Cervicalgia 06/11/2019  . Right trigeminal neuralgia 06/11/2019  . Prediabetes 08/20/2013  . Morbid obesity ([place]) 08/20/2013  . Elevated LFTs 08/20/2013  . Fatty liver 08/20/2013  . Vaginitis and vulvovaginitis 07/12/2013  . 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 Lupus (systemic lupus erythematosus) ([place]) 06/24/2013    Past Medical History:  Diagnosis Date  . Allergy   . Asthma   . Lupus ([place])   . Myocardial infarction ([place])   . Rheumatoid arthritis ([place])   . Trigeminal neuralgia     Family History  Problem Relation Age of Onset  . Heart disease Mother   . Osteoporosis Mother   . Prostate cancer Father   . Aneurysm Father   . Diabetes Father   . Migraines Father   . Healthy Daughter   . Healthy Daughter   . Healthy Daughter   . Healthy Daughter   . Healthy Son   . Diabetes Brother    Past Surgical History:  Procedure Laterality Date  . ABDOMINAL HYSTERECTOMY    .  CESAREAN SECTION    . NERVE AND TENDON REPAIR Right   . ROTATOR CUFF REPAIR Left    Social History   Social History Narrative  . Not on file   Immunization History  Administered Date(s) Administered  . Influenza,inj,Quad PF,6+ Mos 07/12/2013     Objective: Vital Signs: BP 106/68 (BP Location: Left Arm, Patient Position: Sitting, Cuff Size: Normal)   Pulse 71   Resp 18   Ht 5' (1.524 m)    Wt 146 lb 3.2 oz (66.3 kg)   BMI 28.55 kg/m    Physical Exam Vitals signs and nursing note reviewed.  Constitutional:      Appearance: She is well-developed.  HENT:     Head: Normocephalic and atraumatic.  Eyes:     Conjunctiva/sclera: Conjunctivae normal.  Neck:     Musculoskeletal: Normal range of motion.  Cardiovascular:     Rate and Rhythm: Normal rate and regular rhythm.     Heart sounds: Normal heart sounds.  Pulmonary:     Effort: Pulmonary effort is normal.     Breath sounds: Normal breath sounds.  Abdominal:     General: Bowel sounds are normal.     Palpations: Abdomen is soft.  Lymphadenopathy:     Cervical: No cervical adenopathy.  Skin:    General: Skin is warm and dry.     Capillary Refill: Capillary refill takes less than 2 seconds.  Neurological:     Mental Status: She is alert and oriented to person, place, and time.  Psychiatric:        Behavior: Behavior normal.      Musculoskeletal Exam: C-spine good ROM.  Lumbar spine difficult to assess while seated.  Shoulder joints, elbow joints, wrist joints, MCPs, PIPs, and DIPs good ROM with no synovitis.  Hip joints, knee joints, ankle joints, MTPs, PIPs, and DIPs good ROM with no synovitis.  She has warmth and inflammation of both knee joints. Tenderness of both ankle joints.    CDAI Exam: CDAI Score: - Patient Global: -; Provider Global: - Swollen: -; Tender: - Joint Exam   No joint exam has been documented for this visit   There is currently no information documented on the homunculus. Go to the Rheumatology activity and complete the homunculus joint exam.  Investigation: No additional findings.  Imaging: Xr Ankle 2 Views Right  Result Date: 07/01/2019 No tibiotalar or subtalar joint space narrowing was noted.  Small posterior calcaneal spur was noted.  Soft tissue swelling was noted. Impression: Unremarkable x-ray of the ankle joint.  Xr Foot 2 Views Right  Result Date: 07/01/2019 No MTP PIP  or DIP narrowing was noted.  No intertarsal joint space narrowing was noted.  No tibiotalar joint space narrowing was noted.  No erosive changes were noted. Impression: Unremarkable x-ray of the foot.  Xr Hand 2 View Left  Result Date: 07/01/2019 No MCP PIP or DIP narrowing was noted.  No intercarpal radiocarpal joint space narrowing was noted.  No erosive changes were noted. Impression: Unremarkable x-ray of the hand.  Xr Hand 2 View Right  Result Date: 07/01/2019 No MCP PIP or DIP narrowing was noted.  No intercarpal radiocarpal joint space narrowing was noted.  No erosive changes were noted. Impression: Unremarkable x-ray of the hand.  Xr Knee 3 View Left  Result Date: 07/01/2019 Mild medial compartment narrowing was noted.  Mild patellofemoral narrowing was noted.  No chondrocalcinosis was noted. 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  Xr Knee 3 View Right  Result Date: 07/01/2019 Mild medial compartment narrowing was noted.  Mild patellofemoral narrowing was noted.  No chondrocalcinosis was noted. 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   Recent Labs: Lab Results  Component Value Date   WBC 4.6 07/01/2019   HGB 10.9 (L) 07/01/2019   PLT 425 (H) 07/01/2019   NA 140 07/01/2019   K 4.0 07/01/2019   CL 105 07/01/2019   CO2 25 07/01/2019   GLUCOSE 86 07/01/2019   BUN 10 07/01/2019   CREATININE 0.58 07/01/2019   BILITOT 0.3 07/01/2019   ALKPHOS 137 (H) 07/12/2013   AST 82 (H) 07/01/2019   ALT 121 (H) 07/01/2019   PROT 7.4 07/01/2019   ALBUMIN 4.1 07/12/2013   CALCIUM 9.4 07/01/2019   GFRAA 120 07/01/2019  UA showed trace protein and calcium oxalate crystals.  ANA 1:40 cytoplasmic, ENA negative, C3-C4 normal, RF negative, anti-CCP negative, CK 33, TSH normal, G6PD normal, 14-3-3 eta negative Pending results beta-2, anticardiolipin, lupus anticoagulant, _0 eta, ESR Speciality Comments: No specialty comments available.  Procedures:  No procedures performed Allergies: Patient has no known allergies.   Assessment / Plan:     Visit Diagnoses: Other organ or system involvement in systemic lupus erythematosus ([place]) - According to patient she was diagnosed in 2000 while in [place] and treated with prednisone and Plaquenil.  She has had interrupted treatment in the past. 07/01/19 ANA 1:40 cytoplasmic, ENA negative, C3-C4 normal, RF negative, anti-CCP negative, CK 33, TSH normal, G6PD normal, 14-3-3 eta negative-labs were reviewed with the patient today in the office.  All questions were addressed.  She is currently taking plaquenil 200 mg 1 tablet by mouth twice daily and was started on a prednisone taper after her initial visit.  She is taking prednisone 15 mg daily and will continue tapering by 5 mg every 2 weeks.  She has persistent pain and inflammation in both knee joints.  She is using a walker to assist with ambulation. She has no other clinical features of autoimmune disease at this time.  We will add on Imuran to her current treatment regimen.  She will start taking Imuran 50 mg 1 tablet by mouth daily. We will obtain lab work today prior to starting her on Imuran.  She will follow up in 2 weeks for lab work. She was advised to notify us if she develops any new or worsening symptoms.  She will follow up in 1 months.   Patient was counseled on the purpose, proper use, and adverse effects of azathioprine including risk of infection, nausea, rash, and hair loss.  Also informed that medication can cause discoloration of urine, sweat and tears. Reviewed risk of cancer after long term use.  Discussed risk of myelosupression and reviewed importance of frequent lab work to monitor blood counts.  Standing orders placed. Reviewed drug-drug interactions including contraindication with allopurinol.  Provided patient with educational materials on azathioprine and  answered all questions.  Patient consented to azathioprine.  Will upload consent into the media tab.   Patient dose will be 50 mg 1 tablet daily.  Prescription will be sent to pharmacy pending lab results and insurance approval.  High risk medication use - Plaquenil 200 mg 1 tablet twice daily. No Plaquenil eye exam on file.  She is currently on prednisone 15 mg by mouth daily.  She is on a prednisone taper which started at 20 mg and she is tapering by 5  mg every 2 weeks.  Most recent CBC showed mild anemia and elevated platelets on 07/01/2019.  Most recent CMP showed elevated alk phos and AST/ALT on 07/01/2019.  Acute pain of both knees - Patient had warmth and swelling in bilateral knee joints.   Pain in right ankle and joints of right foot - Patient swelling in the right ankle.  Long term (current) use of systemic steroids: She is currently taking a prednisone taper which started at 20 mg tapering by 5 mg every 2 weeks.  She is currently taking prednisone 15 mg by mouth daily.  Other medical conditions are listed as follows:   Right trigeminal neuralgia - Followed by Dr. Brett Fairy.  Memory loss  Cervicalgia  Elevated LFTs-I will make a referral to gastroenterology.  Fatty liver  Lipomatosis  Prediabetes-I will schedule appointment for her with the PCP.  Orders: Orders Placed This Encounter  Procedures  . HIV Antibody (routine testing w rflx)  . Serum protein electrophoresis with reflex  . IgG, IgA, IgM  . QuantiFERON-TB Gold Plus  . Hepatitis B surface antigen  . Hepatitis B core antibody, IgM  . Hepatitis C antibody  . Thiopurine methyltransferase(tpmt)rbc   No orders of the defined types were placed in this encounter.   Face-to-face time spent with patient was 30 minutes. Greater than 50% of time was spent in counseling and coordination of care.  Follow-Up Instructions: Return in about 4 weeks (around 08/13/2019) for Systemic lupus erythematosus.   Ofilia Neas,  PA-C   I examined and evaluated the patient with Hazel Sams PA.  Patient has recently moved here from [place].  She was diagnosed with lupus there.  All her autoimmune work-up is negative now.  She has been on Plaquenil for several years.  She presented couple of weeks ago in a wheelchair and a lot of discomfort.  She has warmth and swelling over bilateral knee joints.  She was placed on prednisone taper.  Today she came using a walker and is feeling better.  Although her symptoms have not completely resolved.  She continues to have a lot of joint pain and joint swelling.  I would like to add Imuran.  Some basic labs will be obtained today.  Once the labs are available we can start her on Imuran.  I will also make a GI referral for evaluation of elevated LFTs.  The plan of care was discussed as noted above.  Bo Merino, MD  Note - This record has been created using Editor, commissioning.  Chart creation errors have been sought, but may not always  have been located. Such creation errors do not reflect on  the standard of medical care.

## 2019-07-03 ENCOUNTER — Encounter: Payer: Self-pay | Admitting: Adult Health

## 2019-07-03 ENCOUNTER — Telehealth: Payer: Self-pay | Admitting: Neurology

## 2019-07-03 ENCOUNTER — Ambulatory Visit (INDEPENDENT_AMBULATORY_CARE_PROVIDER_SITE_OTHER): Payer: Medicare Other | Admitting: Adult Health

## 2019-07-03 ENCOUNTER — Other Ambulatory Visit: Payer: Self-pay

## 2019-07-03 VITALS — BP 105/70 | HR 80 | Temp 98.0°F | Ht 60.0 in | Wt 151.4 lb

## 2019-07-03 DIAGNOSIS — R519 Headache, unspecified: Secondary | ICD-10-CM | POA: Diagnosis not present

## 2019-07-03 DIAGNOSIS — M542 Cervicalgia: Secondary | ICD-10-CM | POA: Diagnosis not present

## 2019-07-03 NOTE — Progress Notes (Signed)
I will discuss results at the follow-up visit.

## 2019-07-03 NOTE — Patient Instructions (Signed)
Rest Let us know know if injections were beneficial  If your symptoms worsen or you develop new symptoms please let us know.

## 2019-07-03 NOTE — Progress Notes (Signed)
PATIENT: Brittney Maddox DOB: Nov 27, 1965  REASON FOR VISIT: follow up HISTORY FROM: patient  HISTORY OF PRESENT ILLNESS: Today 07/03/19:  Brittney Maddox is a 54 year old female with a history of head and ear pain.  At her last visit with Dr. Brett Fairy she was sent for an MRI of the brain and neck.  She had those scans done in [place].  I receive those results today.  The MRI and MRA was normal.  Her MRI of the neck showed mild narrowing of the left neural foramen at C5-C6.  She also had a lumbar MRI that showed facet arthropathy at the level of L4-5 and L5-S1.  Since her scans were relatively unremarkable they did not offer any explanation for her symptoms.  The patient reports that she is continues to have occipital discomfort.  She states that the pain is constant.  She reports that the medication carbamazepine, Topamax, gabapentin and prednisone Dosepak has offered her no benefit.  She states that she is having to wear a neck pillow for more comfort.  She states that she has pain that extends down to the middle of the back.  She also notes most recently she feels as if she has "locked jaw" she states that she has trouble speaking because of this.  She also noticed trouble chewing.  She continues to have pain around the right ear predominantly on the outside of the ear.  She states that it hurts to stick an otoscope in her ear.  She also reports that she is now using a walker.  She has trouble ambulating.  She is now living with her daughters.  She returns today for evaluation.    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  She is seen here as a referral/ revisit from Dr. Margarita Rana for evaluation of  Occipital head pain, ear pain and neck stiffness, also word finding difficulties.  She reports a occipital headache so severe that she is sometimes in tears. Opening her mouth hurts, and affects eating and toothbrushing.   She also  described being episodically weak, a degree of weakness that hindered her to rise form the bed ,  Couldn't reach the bathroom.  Has had trouble to open her eyes,   She appears frustrated whele trying to describe her symptoms.  She is demonsatrating part of the conversation that she isn't fitting into words, like a charade.   Her speech varied in clarity and fluency while we discuss the chief complaint. Likely functional.    REVIEW OF SYSTEMS: Out of a complete 14 system review of symptoms, the patient complains only of the following symptoms, and all other reviewed systems are negative.  See HPI  ALLERGIES: No Known Allergies  HOME MEDICATIONS: Outpatient Medications Prior to Visit  Medication Sig Dispense Refill  . butalbital-acetaminophen-caffeine (FIORICET) 50-325-40 MG tablet Take 1-2 tablets by mouth every 12 (twelve) hours as needed for headache. 30 tablet 0  . carbamazepine (TEGRETOL) 200 MG tablet TAKE 1 TABLET BY MOUTH 3 TIMES DAILY 90 tablet 0  . gabapentin (NEURONTIN) 300 MG capsule Take 2 capsules (600 mg total) by mouth at bedtime. 60 capsule 1  . hydroxychloroquine (PLAQUENIL) 200 MG tablet Take 1 tablet (200 mg total) by mouth 2 (two) times daily. 60 tablet 0  . predniSONE (DELTASONE) 5 MG tablet Take 4 tablets (20 mg total) by mouth daily with breakfast for 14 days, THEN 3  tablets (15 mg total) daily with breakfast for 14 days. Call office for rest of taper.. 98 tablet 0  . tiZANidine (ZANAFLEX) 4 MG tablet TAKE 1 TABLET (4 MG TOTAL) BY MOUTH EVERY 8 (EIGHT) HOURS AS NEEDED FOR MUSCLE SPASMS. 90 tablet 0  . topiramate (TOPAMAX) 50 MG tablet Take 1 tablet (50 mg total) by mouth 2 (two) times daily. 60 tablet 2   No facility-administered medications prior to visit.     PAST MEDICAL HISTORY: Past Medical History:  Diagnosis Date  . Allergy   . Asthma   . Lupus ([place])   . Myocardial infarction ([place])   . Rheumatoid arthritis ([place])   . Trigeminal neuralgia     PAST  SURGICAL HISTORY: Past Surgical History:  Procedure Laterality Date  . ABDOMINAL HYSTERECTOMY    . CESAREAN SECTION    . NERVE AND TENDON REPAIR Right   . ROTATOR CUFF REPAIR Left     FAMILY HISTORY: Family History  Problem Relation Age of Onset  . Heart disease Mother   . Osteoporosis Mother   . Prostate cancer Father   . Aneurysm Father   . Diabetes Father   . Migraines Father   . Healthy Daughter   . Healthy Daughter   . Healthy Daughter   . Healthy Daughter   . Healthy Son   . Diabetes Brother     SOCIAL HISTORY: Social History   Socioeconomic History  . Marital status: Legally Separated    Spouse name: Not on file  . Number of children: 5  . Years of education: Not on file  . Highest education level: Not on file  Occupational History  . Not on file  Social Needs  . Financial resource strain: Not on file  . Food insecurity    Worry: Not on file    Inability: Not on file  . Transportation needs    Medical: Not on file    Non-medical: Not on file  Tobacco Use  . Smoking status: Never Smoker  . Smokeless tobacco: Never Used  Substance and Sexual Activity  . Alcohol use: No  . Drug use: No  . Sexual activity: Yes    Birth control/protection: Surgical  Lifestyle  . Physical activity    Days per week: Not on file    Minutes per session: Not on file  . Stress: Not on file  Relationships  . Social Herbalist on phone: Not on file    Gets together: Not on file    Attends religious service: Not on file    Active member of club or organization: Not on file    Attends meetings of clubs or organizations: Not on file    Relationship status: Not on file  . Intimate partner violence    Fear of current or ex partner: Not on file    Emotionally abused: Not on file    Physically abused: Not on file    Forced sexual activity: Not on file  Other Topics Concern  . Not on file  Social History Narrative  . Not on file      PHYSICAL EXAM  Vitals:    07/03/19 1100  BP: 105/70  Pulse: 80  Temp: 98 F (36.7 C)  TempSrc: Oral  Weight: 151 lb 6.4 oz (68.7 kg)  Height: 5' (1.524 m)   Body mass index is 29.57 kg/m.  Generalized: Well developed, in no acute distress   Neurological examination  Mentation: Alert oriented to time,  place, history taking. Follows all commands speech and language fluent Cranial nerve II-XII: Pupils were equal round reactive to light. Extraocular movements were full, visual field were full on confrontational test..  Head turning and shoulder shrug  were normal and symmetric. Motor: The motor testing reveals 5 over 5 strength of all 4 extremities. Good symmetric motor tone is noted throughout.  Sensory: Sensory testing is intact to soft touch on all 4 extremities. No evidence of extinction is noted.  Coordination: Cerebellar testing reveals good finger-nose-finger and heel-to-shin bilaterally.  Gait and station: Patient uses a walker when ambulating.  Gait and movements are very slow and cautious.  Otherwise no significant abnormalities noted. Reflexes: Deep tendon reflexes are symmetric and normal bilaterally.   DIAGNOSTIC DATA (LABS, IMAGING, TESTING) - I reviewed patient records, labs, notes, testing and imaging myself where available.  Lab Results  Component Value Date   WBC 4.6 07/01/2019   HGB 10.9 (L) 07/01/2019   HCT 33.4 (L) 07/01/2019   MCV 89.8 07/01/2019   PLT 425 (H) 07/01/2019      Component Value Date/Time   NA 140 07/01/2019 1131   K 4.0 07/01/2019 1131   CL 105 07/01/2019 1131   CO2 25 07/01/2019 1131   GLUCOSE 86 07/01/2019 1131   BUN 10 07/01/2019 1131   CREATININE 0.58 07/01/2019 1131   CALCIUM 9.4 07/01/2019 1131   PROT 7.4 07/01/2019 1131   ALBUMIN 4.1 07/12/2013 1536   AST 82 (H) 07/01/2019 1131   ALT 121 (H) 07/01/2019 1131   ALKPHOS 137 (H) 07/12/2013 1536   BILITOT 0.3 07/01/2019 1131   GFRNONAA 104 07/01/2019 1131   GFRAA 120 07/01/2019 1131   Lab Results   Component Value Date   CHOL 153 07/12/2013   HDL 65 07/12/2013   LDLCALC 77 07/12/2013   TRIG 55 07/12/2013   CHOLHDL 2.4 07/12/2013   Lab Results  Component Value Date   HGBA1C 5.9 (H) 07/12/2013   No results found for: [id number] Lab Results  Component Value Date   TSH 1.06 07/01/2019      ASSESSMENT AND PLAN 53 y.o. year old female  has a past medical history of Allergy, Asthma, Lupus ([place]), Myocardial infarction ([place]), Rheumatoid arthritis ([place]), and Trigeminal neuralgia. here with:  1.  Occipital pain  The patient is having a multitude of different symptoms not sure that they are all related.  The patient has developed trouble ambulating however that was not present at the previous visit.  She offers that this is stemming from her occipital pain.  Her MRI of the brain and cervical spine and lumbar spine was relatively unremarkable.  I discussed with Dr. Brett Fairy.  I will do trigger point injections today to see if this relieves her occipital pain.  Otherwise from a neurological standpoint we have not found an etiology for her symptoms.  We will see how she responds to trigger point injections.  She will follow-up on an as-needed basis.   Bupivicaine injection/Betamethasone protocol for occipital neuralgia  Bupivacaine 0.5% and Betamethasone 30 mg/5cc 1:1 mixture was injected on the scalp at several locations:  -On the occipital area of the head, 3 injections  1 cc per injection at the midpoint between the mastoid process and the occipital protuberance. 2 other injections were done one finger breadth from the initial injection, one at a 10 o'clock position and the other at a 2 o'clock position.  The patient tolerated the injections well, no complications of the procedure were noted. Injections  were made with a 27-gauge needle.  Bupivicaine NDC number is [phone].  Lot number is [id number] Expriation date is 04/2020   Betamethasone [place] [id number]  Lot number is  [id number] Expiration date is 04/2020    Ward Givens, MSN, NP-C 07/03/2019, 11:13 AM [facility] [address] [place], [place] [address] [phone]

## 2019-07-03 NOTE — Telephone Encounter (Signed)
Called the patient, Brittney Maddox was looking to review MRI results with the patient at her apt as well as assess if trigger point injections were needed at this time. There was no answer to discuss this with the patient.   If pt calls back please ask if she was able to get the MRI completed. If not then we need to push apt out to allow the MRI to be completed prior to the apt.  I have also made the front staff aware to hold off checking her in.

## 2019-07-04 ENCOUNTER — Ambulatory Visit (INDEPENDENT_AMBULATORY_CARE_PROVIDER_SITE_OTHER): Payer: Medicare Other | Admitting: Family Medicine

## 2019-07-04 DIAGNOSIS — M5481 Occipital neuralgia: Secondary | ICD-10-CM | POA: Diagnosis not present

## 2019-07-04 DIAGNOSIS — M5412 Radiculopathy, cervical region: Secondary | ICD-10-CM

## 2019-07-04 DIAGNOSIS — G500 Trigeminal neuralgia: Secondary | ICD-10-CM

## 2019-07-04 DIAGNOSIS — M3219 Other organ or system involvement in systemic lupus erythematosus: Secondary | ICD-10-CM | POA: Diagnosis not present

## 2019-07-04 MED ORDER — GABAPENTIN 300 MG PO CAPS: 600.0000 mg | ORAL_CAPSULE | Freq: Every day | ORAL | 3 refills | Status: DC

## 2019-07-04 MED ORDER — TIZANIDINE HCL 4 MG PO TABS: 4.0000 mg | ORAL_TABLET | Freq: Three times a day (TID) | ORAL | 3 refills | Status: DC | PRN

## 2019-07-04 MED ORDER — DICLOFENAC SODIUM 1 % TD GEL: 4.0000 g | Freq: Four times a day (QID) | TRANSDERMAL | 2 refills | Status: DC

## 2019-07-04 NOTE — Progress Notes (Signed)
Virtual Visit via Telephone Note  I connected with Brittney Maddox, on 07/04/2019 at 11:22 AM by telephone due to the COVID-19 pandemic and verified that I am speaking with the correct person using two identifiers.   Consent: I discussed the limitations, risks, security and privacy concerns of performing an evaluation and management service by telephone and the availability of in person appointments. I also discussed with the patient that there may be a patient responsible charge related to this service. The patient expressed understanding and agreed to proceed.   Location of Patient: Home  Location of Provider: Clinic   Persons participating in Telemedicine visit: Neville M Jewel Alicia Farrington-CMA Dr. Felecia Shelling     History of Present Illness: Brittney Maddox is a 53 year old female with a history of Lupus seen for a follow up of cervicalgia and occipital headaches which have been present for the last 6 weeks. Seen by [facility] yesterday and is s/p trigger point occipital injection and she has noticed a little relief and she is now able to open her moth to chew and cannot perform a complete yawn. Currently remains on Tegretol, Gabapentin, Tizanidine, Topamax. I had referred her for CT scans but she states she went to [place] where she had CT scans, MRI due to the long wait in [place] and these were reviewed at her Neurology visit.  She complains of swelling and stiffness in her knees and is now ambulating with a walker. No recent falls. Her daughter came to stay with her to help her. Feels like she is walking like a 53 year old toddler. Prolonged standing or sitting is agonizing States she was on Prednisone for 15 years and weight was up to 225lbs and got down to 151 lbs again. She feels like she is gaining weight gain again with the Prednisone which she was prescribed by Rheumatology. Also on Plaquenil. Next Rheumatology appointment comes up on 07/16/19.  She  does try to perform some home exercises. On discussing the issue of Physical therapy she would like to hold off at this time  Past Medical History:  Diagnosis Date  . Allergy   . Asthma   . Lupus ([facility])   . Myocardial infarction ([place])   . Rheumatoid arthritis ([place])   . Trigeminal neuralgia    No Known Allergies  Current Outpatient Medications on File Prior to Visit  Medication Sig Dispense Refill  . butalbital-acetaminophen-caffeine (FIORICET) 50-325-40 MG tablet Take 1-2 tablets by mouth every 12 (twelve) hours as needed for headache. 30 tablet 0  . carbamazepine (TEGRETOL) 200 MG tablet TAKE 1 TABLET BY MOUTH 3 TIMES DAILY 90 tablet 0  . gabapentin (NEURONTIN) 300 MG capsule Take 2 capsules (600 mg total) by mouth at bedtime. 60 capsule 1  . hydroxychloroquine (PLAQUENIL) 200 MG tablet Take 1 tablet (200 mg total) by mouth 2 (two) times daily. 60 tablet 0  . predniSONE (DELTASONE) 5 MG tablet Take 4 tablets (20 mg total) by mouth daily with breakfast for 14 days, THEN 3 tablets (15 mg total) daily with breakfast for 14 days. Call office for rest of taper.. 98 tablet 0  . tiZANidine (ZANAFLEX) 4 MG tablet TAKE 1 TABLET (4 MG TOTAL) BY MOUTH EVERY 8 (EIGHT) HOURS AS NEEDED FOR MUSCLE SPASMS. 90 tablet 0  . topiramate (TOPAMAX) 50 MG tablet Take 1 tablet (50 mg total) by mouth 2 (two) times daily. 60 tablet 2   No current facility-administered medications on file prior to visit.  Observations/Objective: Awake, alert, oriented x3 Not in acute distress  Assessment and Plan: 1. Cervical radiculopathy Improving s/p trigger point injections Doing well on current medications - gabapentin (NEURONTIN) 300 MG capsule; Take 2 capsules (600 mg total) by mouth at bedtime.  Dispense: 60 capsule; Refill: 3 - tiZANidine (ZANAFLEX) 4 MG tablet; Take 1 tablet (4 mg total) by mouth every 8 (eight) hours as needed for muscle spasms.  Dispense: 90 tablet; Refill: 3  2. Other systemic lupus  erythematosus with other organ involvement ([place]) Uncontrolled with acute flare affecting gait Continue Prednisone taper Advised to update Rheumatologist with her symptoms Declines PT referral at this time and will get back to Clinic if she changes her mind Fall precautions  3. 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    I discussed the assessment and treatment plan with the patient. The patient was provided an opportunity to ask questions and all were answered. The patient agreed with the plan and demonstrated an understanding of the instructions.   The patient was advised to call back or seek an in-person evaluation if the symptoms worsen or if the condition fails to improve as anticipated.     I provided 26 minutes total of non-face-to-face time during this encounter including median intraservice time, reviewing previous notes, labs, imaging, medications, management and patient verbalized understanding.     Charlott Rakes, MD, FAAFP. [facility] [place], [place]   07/04/2019, 11:22 AM

## 2019-07-04 NOTE — Progress Notes (Signed)
Patient states that she's now walking with a [place] Carol due to joint pain/stiffness.

## 2019-07-06 LAB — SJOGRENS SYNDROME-A EXTRACTABLE NUCLEAR ANTIBODY: SSA (Ro) (ENA) Antibody, IgG: <1 AI

## 2019-07-06 LAB — URINALYSIS, ROUTINE W REFLEX MICROSCOPIC
Bacteria, UA: NONE SEEN /HPF
Bilirubin Urine: NEGATIVE
Glucose, UA: NEGATIVE
Hyaline Cast: NONE SEEN /LPF
Ketones, ur: NEGATIVE
Nitrite: NEGATIVE
Specific Gravity, Urine: 1.026 (ref 1.001–1.03)
pH: 6 (ref 5.0–8.0)

## 2019-07-06 LAB — CBC WITH DIFFERENTIAL/PLATELET
Absolute Monocytes: 377 cells/uL (ref 200–950)
Basophils Absolute: 18 cells/uL (ref 0–200)
Basophils Relative: 0.4 %
Eosinophils Absolute: 60 cells/uL (ref 15–500)
Eosinophils Relative: 1.3 %
HCT: 33.4 % — ABNORMAL LOW (ref 35.0–45.0)
Hemoglobin: 10.9 g/dL — ABNORMAL LOW (ref 11.7–15.5)
Lymphs Abs: 1495 cells/uL (ref 850–3900)
MCH: 29.3 pg (ref 27.0–33.0)
MCHC: 32.6 g/dL (ref 32.0–36.0)
MCV: 89.8 fL (ref 80.0–100.0)
MPV: 9.4 fL (ref 7.5–12.5)
Monocytes Relative: 8.2 %
Neutro Abs: 2650 cells/uL (ref 1500–7800)
Neutrophils Relative %: 57.6 %
Platelets: 425 10*3/uL — ABNORMAL HIGH (ref 140–400)
RBC: 3.72 10*6/uL — ABNORMAL LOW (ref 3.80–5.10)
RDW: 12.4 % (ref 11.0–15.0)
Total Lymphocyte: 32.5 %
WBC: 4.6 10*3/uL (ref 3.8–10.8)

## 2019-07-06 LAB — LUPUS ANTICOAGULANT EVAL W/ REFLEX
PTT-LA Screen: 58 s — ABNORMAL HIGH (ref <=40)
dRVVT: 56 s — ABNORMAL HIGH (ref <=45)

## 2019-07-06 LAB — COMPLETE METABOLIC PANEL WITH GFR
AG Ratio: 0.9 (calc) — ABNORMAL LOW (ref 1.0–2.5)
ALT: 121 U/L — ABNORMAL HIGH (ref 6–29)
AST: 82 U/L — ABNORMAL HIGH (ref 10–35)
Albumin: 3.6 g/dL (ref 3.6–5.1)
Alkaline phosphatase (APISO): 336 U/L — ABNORMAL HIGH (ref 37–153)
BUN: 10 mg/dL (ref 7–25)
CO2: 25 mmol/L (ref 20–32)
Calcium: 9.4 mg/dL (ref 8.6–10.4)
Chloride: 105 mmol/L (ref 98–110)
Creat: 0.58 mg/dL (ref 0.50–1.05)
GFR, Est African American: 120 mL/min/{1.73_m2} (ref >=60)
GFR, Est Non African American: 104 mL/min/{1.73_m2} (ref >=60)
Globulin: 3.8 g/dL (calc) — ABNORMAL HIGH (ref 1.9–3.7)
Glucose, Bld: 86 mg/dL (ref 65–99)
Potassium: 4 mmol/L (ref 3.5–5.3)
Sodium: 140 mmol/L (ref 135–146)
Total Bilirubin: 0.3 mg/dL (ref 0.2–1.2)
Total Protein: 7.4 g/dL (ref 6.1–8.1)

## 2019-07-06 LAB — RHEUMATOID FACTOR: Rheumatoid fact SerPl-aCnc: <14 IU/mL (ref <14)

## 2019-07-06 LAB — ANTI-NUCLEAR AB-TITER (ANA TITER): ANA Titer 1: 1:40 {titer} — ABNORMAL HIGH

## 2019-07-06 LAB — C3 AND C4
C3 Complement: 220 mg/dL — ABNORMAL HIGH (ref 83–193)
C4 Complement: 62 mg/dL — ABNORMAL HIGH (ref 15–57)

## 2019-07-06 LAB — BETA-2 GLYCOPROTEIN ANTIBODIES
Beta-2 Glyco 1 IgA: <9 SAU (ref <=20)
Beta-2 Glyco 1 IgM: <9 SMU (ref <=20)
Beta-2 Glyco I IgG: <9 SGU (ref <=20)

## 2019-07-06 LAB — CARDIOLIPIN ANTIBODIES, IGG, IGM, IGA
Anticardiolipin IgA: <11 [APL'U]
Anticardiolipin IgG: <14 [GPL'U]
Anticardiolipin IgM: 14 [MPL'U] — ABNORMAL HIGH

## 2019-07-06 LAB — SJOGRENS SYNDROME-B EXTRACTABLE NUCLEAR ANTIBODY: SSB (La) (ENA) Antibody, IgG: <1 AI

## 2019-07-06 LAB — ANA: Anti Nuclear Antibody (ANA): POSITIVE — AB

## 2019-07-06 LAB — SEDIMENTATION RATE

## 2019-07-06 LAB — 14-3-3 ETA PROTEIN: 14-3-3 eta Protein: <0.2 ng/mL (ref <0.2)

## 2019-07-06 LAB — CK: Total CK: 33 U/L (ref 29–143)

## 2019-07-06 LAB — GLUCOSE 6 PHOSPHATE DEHYDROGENASE: G-6PDH: 15 U/g Hgb (ref 7.0–20.5)

## 2019-07-06 LAB — THROMBIN CLOTTING TIME: Thrombin Clotting Time: 17 s (ref 13–19)

## 2019-07-06 LAB — TSH: TSH: 1.06 mIU/L

## 2019-07-06 LAB — ANTI-DNA ANTIBODY, DOUBLE-STRANDED: ds DNA Ab: 1 IU/mL

## 2019-07-06 LAB — RFLX DRVVT CONFRIM: DRVVT CONFIRM: NEGATIVE

## 2019-07-06 LAB — RFLX HEXAGONAL PHASE CONFIRM: Hexagonal Phase Conf: POSITIVE — AB

## 2019-07-06 LAB — CYCLIC CITRUL PEPTIDE ANTIBODY, IGG: Cyclic Citrullin Peptide Ab: <16 UNITS

## 2019-07-06 LAB — RNP ANTIBODY: Ribonucleic Protein(ENA) Antibody, IgG: <1 AI

## 2019-07-06 LAB — ANTI-SMITH ANTIBODY: ENA SM Ab Ser-aCnc: <1 AI

## 2019-07-06 LAB — SED RATE MANUAL WEST RFLX: SED RATE BY MODIFIED WESTERGREN,MANUAL: 45 mm/h — ABNORMAL HIGH (ref 0–30)

## 2019-07-06 LAB — ANTI-SCLERODERMA ANTIBODY: Scleroderma (Scl-70) (ENA) Antibody, IgG: <1 AI

## 2019-07-09 ENCOUNTER — Ambulatory Visit: Payer: Medicare Other | Admitting: Family Medicine

## 2019-07-15 LAB — HM DIABETES EYE EXAM

## 2019-07-16 ENCOUNTER — Encounter: Payer: Self-pay | Admitting: Rheumatology

## 2019-07-16 ENCOUNTER — Ambulatory Visit (INDEPENDENT_AMBULATORY_CARE_PROVIDER_SITE_OTHER): Payer: Medicare Other | Admitting: Rheumatology

## 2019-07-16 ENCOUNTER — Telehealth: Payer: Self-pay | Admitting: Pharmacist

## 2019-07-16 ENCOUNTER — Ambulatory Visit (HOSPITAL_COMMUNITY)
Admission: RE | Admit: 2019-07-16 | Discharge: 2019-07-16 | Disposition: A | Discharge status: Home / Self Care | Payer: Medicare Other | Source: Ambulatory Visit | Attending: Rheumatology | Admitting: Rheumatology

## 2019-07-16 ENCOUNTER — Other Ambulatory Visit: Payer: Self-pay

## 2019-07-16 VITALS — BP 106/68 | HR 71 | Resp 18 | Ht 60.0 in | Wt 146.2 lb

## 2019-07-16 DIAGNOSIS — Z79899 Other long term (current) drug therapy: Secondary | ICD-10-CM | POA: Diagnosis not present

## 2019-07-16 DIAGNOSIS — Z9225 Personal history of immunosupression therapy: Secondary | ICD-10-CM | POA: Insufficient documentation

## 2019-07-16 DIAGNOSIS — Z7952 Long term (current) use of systemic steroids: Secondary | ICD-10-CM

## 2019-07-16 DIAGNOSIS — R413 Other amnesia: Secondary | ICD-10-CM

## 2019-07-16 DIAGNOSIS — M3219 Other organ or system involvement in systemic lupus erythematosus: Secondary | ICD-10-CM

## 2019-07-16 DIAGNOSIS — M25571 Pain in right ankle and joints of right foot: Secondary | ICD-10-CM | POA: Diagnosis not present

## 2019-07-16 DIAGNOSIS — M25562 Pain in left knee: Secondary | ICD-10-CM

## 2019-07-16 DIAGNOSIS — R7989 Other specified abnormal findings of blood chemistry: Secondary | ICD-10-CM

## 2019-07-16 DIAGNOSIS — R7303 Prediabetes: Secondary | ICD-10-CM

## 2019-07-16 DIAGNOSIS — M25561 Pain in right knee: Secondary | ICD-10-CM

## 2019-07-16 DIAGNOSIS — M542 Cervicalgia: Secondary | ICD-10-CM

## 2019-07-16 DIAGNOSIS — G500 Trigeminal neuralgia: Secondary | ICD-10-CM

## 2019-07-16 DIAGNOSIS — K760 Fatty (change of) liver, not elsewhere classified: Secondary | ICD-10-CM

## 2019-07-16 DIAGNOSIS — E882 Lipomatosis, not elsewhere classified: Secondary | ICD-10-CM

## 2019-07-16 NOTE — Telephone Encounter (Signed)
Will start low dose Imuran 50 mg 1 tablet daily due to elevated LFT's.  Prescription pending labs results and chest x-ray.

## 2019-07-16 NOTE — Patient Instructions (Addendum)
Got to Brittney Maddox to obtain your chest -xray.  Take left onto [facility] from [address], left onto [address], then next left to the main entrance and park in the visitor deck.  Go to main entrance and ask them to direct you to the radiology department for an x-ray.  [facility] Address: [address], [place], [place] [address]  We will send in a prescription for Imuran pending your chest x-ray and lab results  Standing Labs We placed an order today for your standing lab work.    Please come back and get your standing labs after starting Radom in 2 weeks, 4 weeks, 8 weeks, then every 3 months.  We have open lab daily Monday through Thursday from 8:30-12:30 PM and 1:30-4:30 PM and Friday from 8:30-12:30 PM and 1:30-4:00 PM at the office of Dr. Bo Merino.   You may experience shorter wait times on Monday and Friday afternoons. The office is located at [address] [place], [place], [place] [address] No appointment is necessary.   Labs are drawn by [facility].  You may receive a bill from [facility] for your lab work.  If you wish to have your labs drawn at another location, please call the office 24 hours in advance to send orders.  If you have any questions regarding directions or hours of operation,  please call [phone].   Just as a reminder please drink plenty of water prior to coming for your lab work. Thanks!  Azathioprine tablets What is this medicine? AZATHIOPRINE (ay za THYE oh preen) suppresses the immune system. It is used to prevent organ rejection after a transplant. It is also used to treat rheumatoid arthritis. This medicine may be used for other purposes; ask your health care provider or pharmacist if you have questions. COMMON BRAND NAME(S): Azasan, Imuran What should I tell my health care provider before I take this medicine? They need to know if you have any of these conditions:  infection  kidney disease  liver  disease  an unusual or allergic reaction to azathioprine, other medicines, lactose, foods, dyes, or preservatives  pregnant or trying to get pregnant  breast feeding How should I use this medicine? Take this medicine by mouth with a full glass of water. Follow the directions on the prescription label. Take your medicine at regular intervals. Do not take your medicine more often than directed. Continue to take your medicine even if you feel better. Do not stop taking except on your doctor's advice. Talk to your pediatrician regarding the use of this medicine in children. Special care may be needed. Overdosage: If you think you have taken too much of this medicine contact a poison control center or emergency room at once. NOTE: This medicine is only for you. Do not share this medicine with others. What if I miss a dose? If you miss a dose, take it as soon as you can. If it is almost time for your next dose, take only that dose. Do not take double or extra doses. What may interact with this medicine? Do not take this medicine with any of the following medications:  febuxostat  mercaptopurine This medicine may also interact with the following medications:  allopurinol  aminosalicylates like sulfasalazine, mesalamine, balsalazide, and olsalazine  leflunomide  medicines called ACE inhibitors like benazepril, captopril, enalapril, fosinopril, quinapril, lisinopril, ramipril, and trandolapril  mycophenolate  sulfamethoxazole; trimethoprim  vaccines  warfarin This list may not describe all possible interactions. Give  your health care provider a list of all the medicines, herbs, non-prescription drugs, or dietary supplements you use. Also tell them if you smoke, drink alcohol, or use illegal drugs. Some items may interact with your medicine. What should I watch for while using this medicine? Visit your doctor or health care professional for regular checks on your progress. You will need  frequent blood checks during the first few months you are receiving the medicine. If you get a cold or other infection while receiving this medicine, call your doctor or health care professional. Do not treat yourself. The medicine may increase your risk of getting an infection. Women should inform their doctor if they wish to become pregnant or think they might be pregnant. There is a potential for serious side effects to an unborn child. Talk to your health care professional or pharmacist for more information. Men may have a reduced sperm count while they are taking this medicine. Talk to your health care professional for more information. This medicine may increase your risk of getting certain kinds of cancer. Talk to your doctor about healthy lifestyle choices, important screenings, and your risk. What side effects may I notice from receiving this medicine? Side effects that you should report to your doctor or health care professional as soon as possible:  allergic reactions like skin rash, itching or hives, swelling of the face, lips, or tongue  changes in vision  confusion  fever, chills, or any other sign of infection  loss of balance or coordination  severe stomach pain  unusual bleeding, bruising  unusually weak or tired  vomiting  yellowing of the eyes or skin Side effects that usually do not require medical attention (report to your doctor or health care professional if they continue or are bothersome):  hair loss  nausea This list may not describe all possible side effects. Call your doctor for medical advice about side effects. You may report side effects to FDA at 1-800-FDA-1088. Where should I keep my medicine? Keep out of the reach of children. Store at room temperature between 15 and 25 degrees C (59 and 77 degrees F). Protect from light. Throw away any unused medicine after the expiration date. NOTE: This sheet is a summary. It may not cover all possible  information. If you have questions about this medicine, talk to your doctor, pharmacist, or health care provider.  2020 Elsevier/Gold Standard (2013-12-24 12:00:31)

## 2019-07-16 NOTE — Progress Notes (Signed)
Pharmacy Note  Subjective: Patient presents today to the [facility] to see Dr. Estanislado Pandy.  Patient seen by the pharmacist for counseling on azathioprine (Imuran) for systemic lupus erythematosus.  She has been on Plaquenil with inadequate response. She has also been on long-term prednisone in the past. She has a history of elevated LFT's.  Objective: CMP     Component Value Date/Time   NA 140 07/01/2019 1131   K 4.0 07/01/2019 1131   CL 105 07/01/2019 1131   CO2 25 07/01/2019 1131   GLUCOSE 86 07/01/2019 1131   BUN 10 07/01/2019 1131   CREATININE 0.58 07/01/2019 1131   CALCIUM 9.4 07/01/2019 1131   PROT 7.4 07/01/2019 1131   ALBUMIN 4.1 07/12/2013 1536   AST 82 (H) 07/01/2019 1131   ALT 121 (H) 07/01/2019 1131   ALKPHOS 137 (H) 07/12/2013 1536   BILITOT 0.3 07/01/2019 1131   GFRNONAA 104 07/01/2019 1131   GFRAA 120 07/01/2019 1131    CBC    Component Value Date/Time   WBC 4.6 07/01/2019 1131   RBC 3.72 (L) 07/01/2019 1131   HGB 10.9 (L) 07/01/2019 1131   HCT 33.4 (L) 07/01/2019 1131   PLT 425 (H) 07/01/2019 1131   MCV 89.8 07/01/2019 1131   MCH 29.3 07/01/2019 1131   MCHC 32.6 07/01/2019 1131   RDW 12.4 07/01/2019 1131   LYMPHSABS 1,495 07/01/2019 1131   MONOABS 0.2 06/09/2019 2046   EOSABS 60 07/01/2019 1131   BASOSABS 18 07/01/2019 1131    Baseline Immunosuppressant Therapy Labs TB GOLD: pending 07/16/2019  Hepatitis Panel: pending 07/16/2019  HIV: pending 07/16/2019  Immunoglobulins:pending 07/16/2019  SPEP: pending 07/16/2019  G6PD Lab Results  Component Value Date   G6PDH 15.0 07/01/2019   TPMT: pending 07/16/2019  Chest x-ray: pending 07/16/2019  Assessment/Plan: Patient was counseled on the purpose, proper use, and adverse effects of azathioprine including risk of infection, nausea, rash, and hair loss. Also informed that medication can cause discoloration of urine, sweat and tears. Reviewed risk of cancer after long term use.  Discussed  risk of myelosupression and reviewed importance of frequent lab work to monitor blood counts.  Standing orders placed.  Reviewed drug-drug interactions including contraindication with allopurinol.  Provided patient with educational materials on azathioprine and answered all questions.  Patient consented to azathioprine.  Will upload consent into the media tab.   Will start low dose Imuran 50 mg 1 tablet daily due to elevated LFT's.  Prescription pending labs results and chest x-ray.  All questions encouraged and answered.  Instructed patient to call with any questions or concerns.  Mariella Saa, PharmD, [place], [place] [facility] Pharmacist [phone]  07/16/2019 12:38 PM

## 2019-07-18 ENCOUNTER — Telehealth: Payer: Self-pay | Admitting: General Practice

## 2019-07-18 DIAGNOSIS — M3219 Other organ or system involvement in systemic lupus erythematosus: Secondary | ICD-10-CM

## 2019-07-18 DIAGNOSIS — M2550 Pain in unspecified joint: Secondary | ICD-10-CM

## 2019-07-18 NOTE — Telephone Encounter (Signed)
Patient called requesting to begin physical therapy, she spoke to provider on 07/04/2019 about this and would like to be referred over. Please follow up

## 2019-07-18 NOTE — Progress Notes (Signed)
FYI

## 2019-07-18 NOTE — Telephone Encounter (Signed)
Patient notified

## 2019-07-18 NOTE — Telephone Encounter (Signed)
Referral has been placed. 

## 2019-07-18 NOTE — Telephone Encounter (Signed)
Please advise.  Patient has thought about it & would like to be referred for PT.

## 2019-07-22 ENCOUNTER — Other Ambulatory Visit: Payer: Self-pay | Admitting: Rheumatology

## 2019-07-22 DIAGNOSIS — M3219 Other organ or system involvement in systemic lupus erythematosus: Secondary | ICD-10-CM

## 2019-07-22 NOTE — Telephone Encounter (Signed)
Last Visit: 07/26/19 Next Visit: 08/22/19  Okay to refill per Dr. Estanislado Pandy

## 2019-07-23 ENCOUNTER — Other Ambulatory Visit: Payer: Self-pay | Admitting: Family Medicine

## 2019-07-23 DIAGNOSIS — M5412 Radiculopathy, cervical region: Secondary | ICD-10-CM

## 2019-07-24 ENCOUNTER — Ambulatory Visit: Payer: Medicare Other | Admitting: Rheumatology

## 2019-07-25 ENCOUNTER — Telehealth: Payer: Self-pay

## 2019-07-25 NOTE — Telephone Encounter (Signed)
We received a VM on the triage phone from Amy at Dr Patrici Ranks office stating that they received the referral that was faxed for patient but they are missing records/insurance/demographics [id number]

## 2019-07-29 NOTE — Telephone Encounter (Signed)
Patient went to [facility], referral closed.

## 2019-07-30 ENCOUNTER — Telehealth: Payer: Self-pay | Admitting: Adult Health

## 2019-07-30 NOTE — Telephone Encounter (Signed)
I called pt and asked if she had good results from last injections.  She stated no.  I told her I had opening for tomorrow at 0.800, be here [phone] for check in. She continues to have headaches.

## 2019-07-30 NOTE — Telephone Encounter (Signed)
Please call patient and schedule with Dr. Brett Fairy- I am not sure what Dr. Brett Fairy wants to offer.

## 2019-07-30 NOTE — Telephone Encounter (Signed)
Pt called wanting to schedule an appt for her injections. Please advise.

## 2019-07-30 NOTE — Telephone Encounter (Signed)
I called and changed appt to Dr. Brett Fairy schedule for this Thursday at 0900.

## 2019-07-31 ENCOUNTER — Ambulatory Visit: Payer: Self-pay | Admitting: Adult Health

## 2019-07-31 LAB — HEPATITIS C ANTIBODY
Hepatitis C Ab: NONREACTIVE
SIGNAL TO CUT-OFF: 0.03 (ref <1.00)

## 2019-07-31 LAB — IGG, IGA, IGM
IgG (Immunoglobin G), Serum: 1156 mg/dL (ref 600–1640)
IgM, Serum: 205 mg/dL (ref 50–300)
Immunoglobulin A: 310 mg/dL (ref 47–310)

## 2019-07-31 LAB — PROTEIN ELECTROPHORESIS, SERUM, WITH REFLEX
Albumin ELP: 3.4 g/dL — ABNORMAL LOW (ref 3.8–4.8)
Alpha 1: 0.6 g/dL — ABNORMAL HIGH (ref 0.2–0.3)
Alpha 2: 1.8 g/dL — ABNORMAL HIGH (ref 0.5–0.9)
Beta 2: 0.6 g/dL — ABNORMAL HIGH (ref 0.2–0.5)
Beta Globulin: 0.5 g/dL (ref 0.4–0.6)
Gamma Globulin: 1.1 g/dL (ref 0.8–1.7)
Total Protein: 8 g/dL (ref 6.1–8.1)

## 2019-07-31 LAB — QUANTIFERON-TB GOLD PLUS
Mitogen-NIL: 5.16 IU/mL
NIL: 0.04 IU/mL
QuantiFERON-TB Gold Plus: NEGATIVE
TB1-NIL: 0 IU/mL
TB2-NIL: <0 IU/mL

## 2019-07-31 LAB — THIOPURINE METHYLTRANSFERASE (TPMT), RBC: Thiopurine Methyltransferase, RBC: 11 nmol/hr/mL RBC — ABNORMAL LOW

## 2019-07-31 LAB — HEPATITIS B SURFACE ANTIGEN: Hepatitis B Surface Ag: NONREACTIVE

## 2019-07-31 LAB — HEPATITIS B CORE ANTIBODY, IGM: Hep B C IgM: NONREACTIVE

## 2019-07-31 LAB — IFE INTERPRETATION: Immunofix Electr Int: NOT DETECTED

## 2019-07-31 LAB — HIV ANTIBODY (ROUTINE TESTING W REFLEX): HIV 1&2 Ab, 4th Generation: NONREACTIVE

## 2019-08-01 ENCOUNTER — Encounter: Payer: Self-pay | Admitting: Neurology

## 2019-08-01 ENCOUNTER — Ambulatory Visit (INDEPENDENT_AMBULATORY_CARE_PROVIDER_SITE_OTHER): Payer: Medicare Other | Admitting: Neurology

## 2019-08-01 ENCOUNTER — Other Ambulatory Visit: Payer: Self-pay

## 2019-08-01 VITALS — BP 122/82 | HR 74 | Temp 98.7°F | Ht 60.0 in | Wt 146.0 lb

## 2019-08-01 DIAGNOSIS — M542 Cervicalgia: Secondary | ICD-10-CM | POA: Diagnosis not present

## 2019-08-01 DIAGNOSIS — G500 Trigeminal neuralgia: Secondary | ICD-10-CM

## 2019-08-01 MED ORDER — AZATHIOPRINE 50 MG PO TABS: 25.0000 mg | ORAL_TABLET | Freq: Every day | ORAL | 0 refills | Status: DC

## 2019-08-01 MED ORDER — CARBAMAZEPINE 200 MG PO TABS: 200.0000 mg | ORAL_TABLET | Freq: Three times a day (TID) | ORAL | 0 refills | Status: DC

## 2019-08-01 NOTE — Telephone Encounter (Signed)
As she is TP MT low metabolizer we can try low-dose of Imuran starting at 25 mg p.o. daily and monitor labs every 2 weeks. She might be okay just to staying on 50 mg of Imuran if tolerated.   Patient advised to return in two weeks for labs. Patient verbalized understanding. Prescription sent to the pharmacy.

## 2019-08-01 NOTE — Progress Notes (Signed)
TPMT: low metabolizer.  We were planning on starting her on Imuran. Please advise.    HIV negative. IFE did not reveal any monoclonal proteins.  Immunoglobulins WNL.  TB gold negative. Hep B and C are negative.

## 2019-08-01 NOTE — Patient Instructions (Signed)
Trigeminal Neuralgia  Trigeminal neuralgia is a nerve disorder that causes severe pain on one side of the face. The pain may last from a few seconds to several minutes. The pain is usually only on one side of the face. Symptoms may occur for days, weeks, or months and then go away for months or years. The pain may return and be worse than before. What are the causes? This condition is caused by damage or pressure to a nerve in the head that is called the trigeminal nerve. An attack can be triggered by:  Talking.  Chewing.  Putting on makeup.  Washing your face.  Shaving your face.  Brushing your teeth.  Touching your face. What increases the risk? You are more likely to develop this condition if you:  Are 50 years of age or older.  Are female. What are the signs or symptoms? The main symptom of this condition is severe pain in the:  Jaw.  Lips.  Eyes.  Nose.  Scalp.  Forehead.  Face. The pain may be:  Intense.  Stabbing.  Electric.  Shock-like. How is this diagnosed? This condition is diagnosed with a physical exam. A CT scan or an MRI may be done to rule out other conditions that can cause facial pain. How is this treated? This condition may be treated with:  Avoiding the things that trigger your symptoms.  Taking prescription medicines (anticonvulsants).  Having surgery. This may be done in severe cases if other medical treatment does not provide relief.  Having procedures such as ablation, thermal, or radiation therapy. It may take up to one month for treatment to start relieving the pain. Follow these instructions at home: Managing pain  Learn as much as you can about how to manage your pain. Ask your health care provider if a pain specialist would be helpful.  Consider talking with a mental health care provider (psychologist) about how to cope with the pain.  Consider joining a pain support group. General instructions  Take  over-the-counter and prescription medicines only as told by your health care provider.  Avoid the things that trigger your symptoms. It may help to: ? Chew on the unaffected side of your mouth. ? Avoid touching your face. ? Avoid blasts of hot or cold air.  Follow your treatment plan as told by your health care provider. This may include: ? Cognitive or behavioral therapy. ? Gentle, regular exercise. ? Meditation or yoga. ? Aromatherapy.  Keep all follow-up visits as told by your health care provider. You may need to be monitored closely to make sure treatment is working well for you. Where to find more information  Facial Pain Association: fpa-support.org Contact a health care provider if:  Your medicine is not helping your symptoms.  You have side effects from the medicine used for treatment.  You develop new, unexplained symptoms, such as: ? Double vision. ? Facial weakness. ? Facial numbness. ? Changes in hearing or balance.  You feel depressed. Get help right away if:  Your pain is severe and is not getting better.  You develop suicidal thoughts. If you ever feel like you may hurt yourself or others, or have thoughts about taking your own life, get help right away. You can go to your nearest emergency department or call:  Your local emergency services (911 in the [place]  A suicide crisis helpline, such as the National Suicide Prevention Lifeline at 1-800-273-8255. This is open 24 hours a day. Summary  Trigeminal neuralgia is a   nerve disorder that causes severe pain on one side of the face. The pain may last from a few seconds to several minutes.  This condition is caused by damage or pressure to a nerve in the head that is called the trigeminal nerve.  Treatment may include avoiding the things that trigger your symptoms, taking medicines, or having surgery or procedures. It may take up to one month for treatment to start relieving the pain.  Avoid the things that  trigger your symptoms.  Keep all follow-up visits as told by your health care provider. You may need to be monitored closely to make sure treatment is working well for you. This information is not intended to replace advice given to you by your health care provider. Make sure you discuss any questions you have with your health care provider. Document Released: 08/26/2000 Document Revised: 07/16/2018 Document Reviewed: 07/16/2018 Elsevier Patient Education  2020 Elsevier Inc.  

## 2019-08-01 NOTE — Progress Notes (Signed)
As she is TP MT low metabolizer we can try low-dose of Imuran starting at 25 mg p.o. daily and monitor labs every 2 weeks.  She might be okay just to staying on 50 mg of Imuran if tolerated.

## 2019-08-01 NOTE — Progress Notes (Signed)
[facility]    Provider:  Larey Seat, MD  Primary Care Physician:  Dr Margarita Rana    Referring Provider: Dr Estanislado Pandy , Dr Margarita Rana.         Chief Complaint according to patient   Patient presents with:     New Patient (Initial Visit)           HISTORY OF PRESENT ILLNESS:  Brittney Maddox is a 53 y.o. year old 34 or African American female patient seen here in a RV on 08/01/2019   Chief concern according to patient : headaches not getting better after triggerpoint injection. MRI brain and spine was negative. Studies done in [place]- reviewed with NP [place] in last visit here , 07-30-2019. No response to carbamazepine, Topamax, Gapapentin, and prednisne dose-pack all without desired effect.    I have the pleasure of seeing Brittney Maddox on 08-01-2019 again , a right -handed Black or African American female with occipital pain-   has a past medical history of Allergy, Asthma, Lupus ([place]), Myocardial infarction ([place]), Rheumatoid arthritis ([place]), and Trigeminal neuralgia.   The patient has recently undergone several Blood tests for autoimmune diseases and these returned positive ( MILD) for ANA, 1:40, positive Lupus, the antiscleroderma antibody was negative, red blood cells were low at 3,720,000/mcL, hemoglobin was 10.9 g/dL hematocrit 33.4%, platelets 420 5K elevated, no abnormality by using a field neutrophil or monocytes.  Complete metabolic panel normal GFR and creatinine elevated alkaline phosphatase 336, AST 82 units/L and ALT 121 units/L, trace hemoglobin and urine dipstick, 6-10 white blood cells, 20 red blood cells.  CK normal range 33.  TSH normal 1.06.  Sed rate was counseled, rheumatoid factor negative, cyclic citrulline peptide negative, 14-3-3 protein negative, ANA mildly positive anti-Smith antibody negative, SSA negative, double-stranded DNA antibody negative.  C3 complement 220 elevated C4 complement 62 mildly elevated.  Beta-2 glycoprotein antibodies  negative, and she cardiolipin IgM 14 which is indeterminate, IgG negative IgA negative.  Labs were dated from 07/03/2019.  As we know the patient has been on Plaquenil with inadequate response she had been on long-term prednisone in the past she is now planning to switch to Imuran with her rheumatologist.  QuantiFERON gold plus was negative, IgG IgA and IgM serology was negative protein serum electrophoresis with reflex with indicating of m2 spike, acute inflammatory pattern.    Her last visit with PCP  Dr Margarita Rana, Charlane Ferretti has had a telephone visit (!) with this patient on 07-04-2019.    In my initial visit with the patient I described her as having occipital and face pain,  reaching the right ear causing neck stiffness.  She had described the occipital headache is so severe that she sometimes is in tears.  Opening her mouth hurts and this pain affects her ability to eat or to brush her teeth.  She also felt episodically very weak and generalized weakness that hindered her to rise from the bed in the morning she had trouble walking to the bathroom.  She even has trouble to open her eyes.  Part of the conversation was wordless she used of gestures to describe how she felt, I used the term :"charade" .  Speech varied in clarity and fluency when we discussed the chief complaint.     Today date is very a basic, static, wide-based, with an inability to lift her feet off the ground, and almost Gorica gait when she walks with my support.  Continuously being wide-based and walking  on the heel and lateral edge of her foot.     Review of Systems: Out of a complete 14 system review, the patient complains of only the following symptoms, and all other reviewed systems are negative.:  Fatigue, pain,     Social History   Socioeconomic History   Marital status: Legally Separated    Spouse name: Not on file   Number of children: 5   Years of education: Not on file   Highest education level: Not on file    Occupational History   Not on file  Social Needs   Financial resource strain: Not on file   Food insecurity    Worry: Not on file    Inability: Not on file   Transportation needs    Medical: Not on file    Non-medical: Not on file  Tobacco Use   Smoking status: Never Smoker   Smokeless tobacco: Never Used  Substance and Sexual Activity   Alcohol use: No   Drug use: No   Sexual activity: Yes    Birth control/protection: Surgical  Lifestyle   Physical activity    Days per week: Not on file    Minutes per session: Not on file   Stress: Not on file  Relationships   Social connections    Talks on phone: Not on file    Gets together: Not on file    Attends religious service: Not on file    Active member of club or organization: Not on file    Attends meetings of clubs or organizations: Not on file    Relationship status: Not on file  Other Topics Concern   Not on file  Social History Narrative   Not on file    Family History  Problem Relation Age of Onset   Heart disease Mother    Osteoporosis Mother    Prostate cancer Father    Aneurysm Father    Diabetes Father    Migraines Father    Healthy Daughter    Healthy Daughter    Healthy Daughter    Healthy Daughter    Healthy Son    Diabetes Brother     Past Medical History:  Diagnosis Date   Allergy    Asthma    Lupus ([place])    Myocardial infarction ([place])    Rheumatoid arthritis ([place])    Trigeminal neuralgia     Past Surgical History:  Procedure Laterality Date   ABDOMINAL HYSTERECTOMY     CESAREAN SECTION     NERVE AND TENDON REPAIR Right    ROTATOR CUFF REPAIR Left      Current Outpatient Medications on File Prior to Visit  Medication Sig Dispense Refill   butalbital-acetaminophen-caffeine (FIORICET) 50-325-40 MG tablet Take 1-2 tablets by mouth every 12 (twelve) hours as needed for headache. 30 tablet 0   carbamazepine (TEGRETOL) 200 MG tablet TAKE 1 TABLET BY  MOUTH 3 TIMES DAILY 90 tablet 0   diclofenac sodium (VOLTAREN) 1 % GEL Apply 4 g topically 4 (four) times daily. 100 g 2   gabapentin (NEURONTIN) 300 MG capsule Take 2 capsules (600 mg total) by mouth at bedtime. 60 capsule 3   hydroxychloroquine (PLAQUENIL) 200 MG tablet Take 1 tablet (200 mg total) by mouth 2 (two) times daily. 60 tablet 0   predniSONE (DELTASONE) 5 MG tablet Take 2 tabs po with breakfast x 14 days, then take 1 tab po daily x 14 days 42 tablet 0   tiZANidine (ZANAFLEX) 4 MG  tablet Take 1 tablet (4 mg total) by mouth every 8 (eight) hours as needed for muscle spasms. 90 tablet 3   topiramate (TOPAMAX) 50 MG tablet Take 1 tablet (50 mg total) by mouth 2 (two) times daily. 60 tablet 2   No current facility-administered medications on file prior to visit.     No Known Allergies  Physical exam:  Today's Vitals   08/01/19 0856  BP: 122/82  Pulse: 74  Temp: 98.7 F (37.1 C)  Weight: 146 lb (66.2 kg)  Height: 5' (1.524 m)   Body mass index is 28.51 kg/m.   Wt Readings from Last 3 Encounters:  08/01/19 146 lb (66.2 kg)  07/16/19 146 lb 3.2 oz (66.3 kg)  07/03/19 151 lb 6.4 oz (68.7 kg)     Ht Readings from Last 3 Encounters:  08/01/19 5' (1.524 m)  07/16/19 5' (1.524 m)  07/03/19 5' (1.524 m)      General: The patient is awake, alert and appears not in acute distress. The patient is well groomed. Head: Normocephalic, atraumatic. Neck is supple. Cardiovascular:  Regular rate and cardiac rhythm by pulse,  without distended neck veins. Respiratory: Lungs are clear to auscultation.  Skin:  Without evidence of ankle edema, or rash. Trunk: The patient's posture is erect.   Neurologic exam : The patient is awake and alert, oriented to place and time.   Memory subjective described as intact.  Attention span & concentration ability appears normal.  Speech is fluent,  without  dysarthria, dysphonia but irregular aphasia.  Mood and affect are appropriate.     Cranial nerves: no loss of smell or taste reported  Pupils are equal and briskly reactive to light. Extraocular movements in vertical and horizontal planes were intact and without nystagmus. No Diplopia. Visual fields by finger perimetry are intact. Hearing was intact to soft voice and finger rubbing.    Facial sensation intact to fine touch.  Facial motor strength is symmetric and tongue and uvula move midline.  Neck ROM : rotation, tilt and flexion extension were normal for age and shoulder shrug was symmetrical.    Motor exam:  Symmetric bulk, tone and ROM.   Normal tone without cog wheeling, symmetric grip strength .   Sensory:  Fine touch, pinprick and vibration were tested  and  normal.  Proprioception tested in the upper extremities was normal.   Coordination: Rapid alternating movements in the fingers/hands were of normal speed.  The Finger-to-nose maneuver appeared functional.  Gait and station: Today date is very abasic, astatic, wide-based, with an inability to lift her feet off the ground, and almost storch-like  gait when she walks with my support.   Continuously being wide-based and walking on the heel and lateral edge of her foot. Patient could rise unassisted from a seated position, walked without assistive device.  Stance is extremely wide- Abasia and Astasia noted.  Deep tendon reflexes: in the  upper and lower extremities are symmetric and intact.  Babinski response was deferred.       After spending a total time of 30 minutes face to face and additional time for physical and neurologic examination, review of laboratory studies, ( Dr Estanislado Pandy)  personal review of imaging studies. I have established the following assessments:  1) The patient may have neuralgia related to an auto-immune response , but her speech pattern and gait demostration are non organic.  2) Triggerpoints helped for one week, not longer.  3) All classic medications used for trigeminal neuralgia  have failed.  Even steroids failed this time to give relief. She stated she was given a steroid dose pack,  Currently uses 40 mg prednisone po. Tomorrow she is supposed to use 30 mg.    My Plan is to proceed with:  1) there is no focal neurologic deficit, MRI showed no abnormality, gait and speech are functionally impaired.  she has headaches- these are of an atypical facial pain character, worsened by touch and motion.  The pain trigger points are at the right trigeminal points, also retroauricular and at the corona.  Lupus related headaches?  Carbamazepine 200 mg tid, I will refill for now.     I recommend referral to a tertiary care center.   Electronically signed by: Larey Seat, MD 08/01/2019 9:11 AM  [facility] Board certified by The [employer] of Sleep Medicine and Diplomate of the [employer] of Sleep Medicine. Board certified In Neurology through the [place], Fellow of the [employer] of Neurology. Medical Director of [facility].

## 2019-08-05 ENCOUNTER — Ambulatory Visit
Admission: EM | Admit: 2019-08-05 | Discharge: 2019-08-05 | Disposition: A | Discharge status: Home / Self Care | Payer: Medicare Other | Attending: Physician Assistant | Admitting: Physician Assistant

## 2019-08-05 ENCOUNTER — Telehealth: Payer: Self-pay | Admitting: Rheumatology

## 2019-08-05 ENCOUNTER — Telehealth: Payer: Self-pay

## 2019-08-05 ENCOUNTER — Ambulatory Visit (INDEPENDENT_AMBULATORY_CARE_PROVIDER_SITE_OTHER): Payer: Medicare Other

## 2019-08-05 DIAGNOSIS — Z20828 Contact with and (suspected) exposure to other viral communicable diseases: Secondary | ICD-10-CM

## 2019-08-05 DIAGNOSIS — R509 Fever, unspecified: Secondary | ICD-10-CM

## 2019-08-05 DIAGNOSIS — Z20822 Contact with and (suspected) exposure to covid-19: Secondary | ICD-10-CM

## 2019-08-05 LAB — POCT INFLUENZA A/B
Influenza A, POC: NEGATIVE
Influenza B, POC: NEGATIVE

## 2019-08-05 NOTE — Telephone Encounter (Signed)
Patient called stating she has been experiencing fever (103.2) weakness, headaches, and a cough.  Patient states she is due to take her Imuran and also have labwork and requesting a return call.  Patient was told to contact her PCP with her symptoms.

## 2019-08-05 NOTE — Telephone Encounter (Signed)
Patient called back & states that she will go to Urgent Care at [facility] to be COVID tested since it is closer to her.

## 2019-08-05 NOTE — Telephone Encounter (Signed)
Advised patient to hold off on imuran until fever and infection has resolved, patient verbalized understanding. Patient has placed a call in to her PCPs office. I also provided patient with COVID community testing information for [facility] location.

## 2019-08-05 NOTE — Telephone Encounter (Signed)
Patient called in with the following symptoms over the last weekend. Fever(highest 102.8), weakness, headaches(worse than her usual), cough, fatigue, chills. Denies nausea, vomiting, SHOB. States that she has been taking Ibuprofen for fevers.  I did let her know about the drive thru testing at [facility] as well as the urgent care next door doing COVID testing but she wanted me to reach out to MD first.  Please advise.

## 2019-08-05 NOTE — ED Triage Notes (Signed)
Pt c/o fever, headaches, and bodyaches since Friday. States now having diarrhea. Pt states hx of lupus and feels cold all the time.

## 2019-08-05 NOTE — Discharge Instructions (Addendum)
Rapid flu negative.  Chest x-ray shows no pneumonia.  Covid testing ordered. I would like you to quarantine until testing results. Continue ibuprofen for fever and body aches. If experiencing shortness of breath, trouble breathing, chest pain, weakness, dizziness, go to the emergency department for further evaluation needed.   If COVID positive, your daughter will need to quarantine for at least 14 days regardless of if she develops symptom, or if she does COVID testing.

## 2019-08-05 NOTE — ED Provider Notes (Signed)
c [name]    CSN: [id number] Arrival date & time: 08/05/19  1449      History   Chief Complaint Chief Complaint  Patient presents with  . Fever    HPI Brittney Maddox is a 53 y.o. female.   53 year old female comes in for 4 day history of URI symptoms. Has had headache, body aches, fever, diarrhea, chills, rhinorrhea, cough. Denies shortness of breath, loss of taste/smell. Denies abdominal pain, nausea, vomiting. Lemon tea. No known sick/COVID contact. Never smoker. Finished course of prednisone yesterday. Stopped Plaquenil few days ago. Was supposed to start Imuran, but has held off due to URI symptoms. No flu shot this year.      Past Medical History:  Diagnosis Date  . Allergy   . Asthma   . Lupus ([place])   . Myocardial infarction ([place])   . Rheumatoid arthritis ([place])   . Trigeminal neuralgia     Patient Active Problem List   Diagnosis Date Noted  . Cervicalgia 06/11/2019  . Right trigeminal neuralgia 06/11/2019  . Prediabetes 08/20/2013  . Elevated LFTs 08/20/2013  . Fatty liver 08/20/2013  . Vaginitis and vulvovaginitis 07/12/2013  . 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 Lupus (systemic lupus erythematosus) ([place]) 06/24/2013    Past Surgical History:  Procedure Laterality Date  . ABDOMINAL HYSTERECTOMY    . CESAREAN SECTION    . NERVE AND TENDON REPAIR Right   . ROTATOR CUFF REPAIR Left     OB History   No obstetric history on file.      Home Medications    Prior to Admission medications   Medication Sig Start Date End Date Taking? Authorizing Provider  azaTHIOprine (IMURAN) 50 MG tablet Take 0.5 tablets (25 mg total) by mouth daily. 08/01/19   Bo Merino, MD  butalbital-acetaminophen-caffeine (FIORICET) [phone] MG tablet Take 1-2 tablets by mouth every 12 (twelve) hours as needed for headache. 05/24/19 05/23/20  Charlott Rakes, MD  carbamazepine (TEGRETOL) 200 MG tablet Take 1 tablet (200 mg total) by mouth 3 (three) times daily. 08/01/19    Dohmeier, Asencion Partridge, MD  diclofenac sodium (VOLTAREN) 1 % GEL Apply 4 g topically 4 (four) times daily. 07/04/19   Charlott Rakes, MD  gabapentin (NEURONTIN) 300 MG capsule Take 2 capsules (600 mg total) by mouth at bedtime. 07/04/19   Charlott Rakes, MD  hydroxychloroquine (PLAQUENIL) 200 MG tablet Take 1 tablet (200 mg total) by mouth 2 (two) times daily. 07/01/19   Bo Merino, MD  predniSONE (DELTASONE) 5 MG tablet Take 2 tabs po with breakfast x 14 days, then take 1 tab po daily x 14 days 07/22/19   Bo Merino, MD  tiZANidine (ZANAFLEX) 4 MG tablet Take 1 tablet (4 mg total) by mouth every 8 (eight) hours as needed for muscle spasms. 07/04/19   Charlott Rakes, MD  topiramate (TOPAMAX) 50 MG tablet Take 1 tablet (50 mg total) by mouth 2 (two) times daily. 06/06/19   Charlott Rakes, MD    Family History Family History  Problem Relation Age of Onset  . Heart disease Mother   . Osteoporosis Mother   . Prostate cancer Father   . Aneurysm Father   . Diabetes Father   . Migraines Father   . Healthy Daughter   . Healthy Daughter   . Healthy Daughter   . Healthy Daughter   . Healthy Son   . Diabetes Brother     Social History Social History   Tobacco Use  . Smoking status:  Never Smoker  . Smokeless tobacco: Never Used  Substance Use Topics  . Alcohol use: No  . Drug use: No     Allergies   Patient has no known allergies.   Review of Systems Review of Systems  Reason unable to perform ROS: See HPI as above.     Physical Exam Triage Vital Signs ED Triage Vitals  Enc Vitals Group     BP 08/05/19 1505 102/71     Pulse Rate 08/05/19 1505 100     Resp 08/05/19 1505 18     Temp 08/05/19 1505 (!) 101.2 F (38.4 C)     Temp Source 08/05/19 1505 Oral     SpO2 08/05/19 1505 95 %     Weight --      Height --      Head Circumference --      Peak Flow --      Pain Score 08/05/19 1506 0     Pain Loc --      Pain Edu? --      Excl. in [place]? --    No data  found.  Updated Vital Signs BP 102/71 (BP Location: Left Arm)   Pulse 100   Temp (!) 101.2 F (38.4 C) (Oral)   Resp 18   SpO2 95%   Physical Exam Constitutional:      General: She is not in acute distress.    Appearance: Normal appearance. She is not ill-appearing, toxic-appearing or diaphoretic.  HENT:     Head: Normocephalic and atraumatic.     Mouth/Throat:     Mouth: Mucous membranes are moist.     Pharynx: Oropharynx is clear. Uvula midline.  Neck:     Musculoskeletal: Normal range of motion and neck supple.  Cardiovascular:     Rate and Rhythm: Normal rate and regular rhythm.     Heart sounds: Normal heart sounds. No murmur. No friction rub. No gallop.   Pulmonary:     Effort: Pulmonary effort is normal. No accessory muscle usage, prolonged expiration, respiratory distress or retractions.     Comments: Lungs clear to auscultation without adventitious lung sounds. Neurological:     General: No focal deficit present.     Mental Status: She is alert and oriented to person, place, and time.      UC Treatments / Results  Labs (all labs ordered are listed, but only abnormal results are displayed) Labs Reviewed  POCT INFLUENZA A/B - Normal  NOVEL CORONAVIRUS, NAA    EKG   Radiology Dg Chest 2 View  Result Date: 08/05/2019 CLINICAL DATA:  Fever EXAM: CHEST - 2 VIEW COMPARISON:  07/16/2019 FINDINGS: The heart size and mediastinal contours are within normal limits. Both lungs are clear. Disc degenerative disease of the thoracic spine. IMPRESSION: No acute abnormality of the lungs. Electronically Signed   By: Eddie Candle M.D.   On: 08/05/2019 15:54    Procedures Procedures (including critical care time)  Medications Ordered in UC Medications - No data to display  Initial Impression / Assessment and Plan / UC Course  I have reviewed the triage vital signs and the nursing notes.  Pertinent labs & imaging results that were available during my care of the patient  were reviewed by me and considered in my medical decision making (see chart for details).    Although with 4 day onset of symptoms, given history of Lupus with recent adjustment of medications, would cover for flu if positive. Rapid flu testing and CXR  for further evaluation.  Flu negative. CXR negative for active cardiopulmonary disease. COVID testing ordered. continue to monitor. Push fluids. Return precautions given. Patient expresses understanding and agrees to plan.  Final Clinical Impressions(s) / UC Diagnoses   Final diagnoses:  Fever, unspecified  Suspected COVID-19 virus infection   ED Prescriptions    None     PDMP not reviewed this encounter.   Ok Edwards, PA-C 08/05/19 1614

## 2019-08-05 NOTE — Telephone Encounter (Signed)
She should hold off Imuran.  She should see her PCP for evaluation.  Once the fever is resolved and the infection goes away she can resume Imuran.

## 2019-08-06 ENCOUNTER — Ambulatory Visit: Payer: Medicare Other | Admitting: Physical Therapy

## 2019-08-07 LAB — NOVEL CORONAVIRUS, NAA: SARS-CoV-2, NAA: NOT DETECTED

## 2019-08-14 ENCOUNTER — Telehealth: Payer: Self-pay

## 2019-08-14 NOTE — Telephone Encounter (Signed)
Called patient to do their pre-visit COVID screening.  Call went to voicemail. Unable to do prescreening.  

## 2019-08-15 ENCOUNTER — Ambulatory Visit (INDEPENDENT_AMBULATORY_CARE_PROVIDER_SITE_OTHER): Payer: Medicare Other | Admitting: Family Medicine

## 2019-08-15 ENCOUNTER — Other Ambulatory Visit: Payer: Self-pay

## 2019-08-15 VITALS — BP 101/67 | HR 70 | Temp 97.2°F | Resp 17 | Wt 145.0 lb

## 2019-08-15 DIAGNOSIS — G44091 Other trigeminal autonomic cephalgias (TAC), intractable: Secondary | ICD-10-CM | POA: Diagnosis not present

## 2019-08-15 DIAGNOSIS — M3219 Other organ or system involvement in systemic lupus erythematosus: Secondary | ICD-10-CM

## 2019-08-15 DIAGNOSIS — Z79899 Other long term (current) drug therapy: Secondary | ICD-10-CM

## 2019-08-15 MED ORDER — TOPIRAMATE 50 MG PO TABS: 50.0000 mg | ORAL_TABLET | Freq: Two times a day (BID) | ORAL | 2 refills | Status: DC

## 2019-08-15 NOTE — Progress Notes (Signed)
Subjective:  Patient ID: Brittney Maddox, female    DOB: 1965/12/15  Age: 53 y.o. MRN: [id number]  CC: Follow-up   HPI Brittney Maddox is a 53 year old female with a history of Lupus, Trigeminal neuralgia seen for a follow up visit accompanied by her mom.  10 days ago she had an ED visit for fever of unknown origin, work-up was unrevealing.  Fevers have now subsided. She is s/p trigger point occipital injection by Neurology for her cervicalgia and occipital headaches with minimal improvement.  She remains on carbamazepine and Topamax. Last seen by neurology on 08/01/2019, notes reviewed indicate plans to refer her to a tertiary center due to uncontrolled symptoms. Today she complains pain is starting again on the right side of her face and jaw with difficulty opening her mouth to eat to the point that she has to lean forward to shove food into her mouth to prevent opening wide.  At her last visit she had complained of arthralgias, having to ambulate with a walker and has been referred to PT.  She is yet to commence PT but has an upcoming appointment and her ambulation has improved to the point that she no longer needs a walker or cane.  She still has stiffness in her neck and is unable to turn freely from left to right hand she does not drive.  Getting in and out of the car is difficult for her.  Her knee swelling has improved but she has pain in her ankles which she described as 12/10 in her right and 10/10 in her left.  She also complains of losing her balance as she finds herself going backwards when she attempts to walk. Currently on prednisone taper and Plaquenil and is followed by rheumatology with her last visit on 07/16/2019  Past Medical History:  Diagnosis Date  . Allergy   . Asthma   . Lupus ([place])   . Myocardial infarction ([place])   . Rheumatoid arthritis ([place])   . Trigeminal neuralgia     Past Surgical History:  Procedure Laterality Date  . ABDOMINAL HYSTERECTOMY    . CESAREAN  SECTION    . NERVE AND TENDON REPAIR Right   . ROTATOR CUFF REPAIR Left     Family History  Problem Relation Age of Onset  . Heart disease Mother   . Osteoporosis Mother   . Prostate cancer Father   . Aneurysm Father   . Diabetes Father   . Migraines Father   . Healthy Daughter   . Healthy Daughter   . Healthy Daughter   . Healthy Daughter   . Healthy Son   . Diabetes Brother     No Known Allergies  Outpatient Medications Prior to Visit  Medication Sig Dispense Refill  . azaTHIOprine (IMURAN) 50 MG tablet Take 0.5 tablets (25 mg total) by mouth daily. 7 tablet 0  . butalbital-acetaminophen-caffeine (FIORICET) 50-325-40 MG tablet Take 1-2 tablets by mouth every 12 (twelve) hours as needed for headache. 30 tablet 0  . carbamazepine (TEGRETOL) 200 MG tablet Take 1 tablet (200 mg total) by mouth 3 (three) times daily. 90 tablet 0  . diclofenac sodium (VOLTAREN) 1 % GEL Apply 4 g topically 4 (four) times daily. 100 g 2  . gabapentin (NEURONTIN) 300 MG capsule Take 2 capsules (600 mg total) by mouth at bedtime. 60 capsule 3  . hydroxychloroquine (PLAQUENIL) 200 MG tablet Take 1 tablet (200 mg total) by mouth 2 (two) times daily. 60 tablet 0  .  predniSONE (DELTASONE) 10 MG tablet Take 40 mg by mouth daily.    Marland Kitchen tiZANidine (ZANAFLEX) 4 MG tablet Take 1 tablet (4 mg total) by mouth every 8 (eight) hours as needed for muscle spasms. 90 tablet 3  . topiramate (TOPAMAX) 50 MG tablet Take 1 tablet (50 mg total) by mouth 2 (two) times daily. 60 tablet 2   No facility-administered medications prior to visit.      ROS Review of Systems  Constitutional: Negative for activity change, appetite change and fatigue.  HENT: Negative for congestion, sinus pressure and sore throat.   Eyes: Negative for visual disturbance.  Respiratory: Negative for cough, chest tightness, shortness of breath and wheezing.   Cardiovascular: Negative for chest pain and palpitations.  Gastrointestinal: Negative for  abdominal distention, abdominal pain and constipation.  Endocrine: Negative for polydipsia.  Genitourinary: Negative for dysuria and frequency.  Musculoskeletal: Positive for arthralgias, gait problem and neck stiffness. Negative for back pain.  Skin: Negative for rash.  Neurological: Positive for weakness. Negative for tremors, light-headedness and numbness.  Hematological: Does not bruise/bleed easily.  Psychiatric/Behavioral: Negative for agitation and behavioral problems.    Objective:  BP 101/67   Pulse 70   Temp (!) 97.2 F (36.2 C) (Temporal)   Resp 17   Wt 145 lb (65.8 kg)   SpO2 96%   BMI 28.32 kg/m   BP/Weight 08/15/2019 08/05/2019 [id number]  Systolic BP [id number]  Diastolic BP 67 71 82  Wt. (Lbs) 145 - 146  BMI 28.32 - 28.51      Physical Exam Constitutional:      Appearance: She is well-developed.  Neck:     Musculoskeletal: Neck rigidity (limited lateral ROM) present.     Vascular: No JVD.  Cardiovascular:     Rate and Rhythm: Normal rate.     Heart sounds: Normal heart sounds. No murmur.  Pulmonary:     Effort: Pulmonary effort is normal.     Breath sounds: Normal breath sounds. No wheezing or rales.  Chest:     Chest wall: No tenderness.  Abdominal:     General: Bowel sounds are normal. There is no distension.     Palpations: Abdomen is soft. There is no mass.     Tenderness: There is no abdominal tenderness.  Musculoskeletal: Normal range of motion.     Right lower leg: No edema.     Left lower leg: No edema.     Comments: Moderate tenderness on range of motion of bilateral ankles but she does not appear to be in excruciating pain  Neurological:     Mental Status: She is alert and oriented to person, place, and time.  Psychiatric:        Mood and Affect: Mood normal.     CMP Latest Ref Rng & Units 07/16/2019 07/01/2019 06/09/2019  Glucose 65 - 99 mg/dL - 86 97  BUN 7 - 25 mg/dL - 10 12  Creatinine 0.50 - 1.05 mg/dL - 0.58 0.86  Sodium 135  - 146 mmol/L - 140 136  Potassium 3.5 - 5.3 mmol/L - 4.0 3.8  Chloride 98 - 110 mmol/L - 105 106  CO2 20 - 32 mmol/L - 25 20(L)  Calcium 8.6 - 10.4 mg/dL - 9.4 9.5  Total Protein 6.1 - 8.1 g/dL 8.0 7.4 -  Total Bilirubin 0.2 - 1.2 mg/dL - 0.3 -  Alkaline Phos 39 - 117 U/L - - -  AST 10 - 35 U/L - 82(H) -  ALT  6 - 29 U/L - 121(H) -    Lipid Panel     Component Value Date/Time   CHOL 153 07/12/2013 1536   TRIG 55 07/12/2013 1536   HDL 65 07/12/2013 1536   CHOLHDL 2.4 07/12/2013 1536   VLDL 11 07/12/2013 1536   LDLCALC 77 07/12/2013 1536    CBC    Component Value Date/Time   WBC 4.6 07/01/2019 1131   RBC 3.72 (L) 07/01/2019 1131   HGB 10.9 (L) 07/01/2019 1131   HCT 33.4 (L) 07/01/2019 1131   PLT 425 (H) 07/01/2019 1131   MCV 89.8 07/01/2019 1131   MCH 29.3 07/01/2019 1131   MCHC 32.6 07/01/2019 1131   RDW 12.4 07/01/2019 1131   LYMPHSABS 1,495 07/01/2019 1131   MONOABS 0.2 06/09/2019 2046   EOSABS 60 07/01/2019 1131   BASOSABS 18 07/01/2019 1131    Lab Results  Component Value Date   HGBA1C 5.9 (H) 07/12/2013    Assessment & Plan:   1. Other intractable trigeminal autonomic cephalgia (TAC) Uncontrolled Also on carbamazepine Followed by neurology with plans to refer to tertiary center - topiramate (TOPAMAX) 50 MG tablet; Take 1 tablet (50 mg total) by mouth 2 (two) times daily.  Dispense: 60 tablet; Refill: 2  2. High risk medication use We will need to check A1c given steroid use - Hemoglobin A1c  3. Other systemic lupus erythematosus with other organ involvement ([place]) Her arthralgias have improved compared to her last visit Gait has also improved as she is no longer requiring an assistive ambulatory device Noted to be unable to put on her shoes and needing assistance by her mom due to ankle pains Physical therapy will be beneficial and she has an upcoming appointment with them Advised to continue prednisone, Plaquenil as per rheumatology   Health Care  Maintenance: Mammogram and colonoscopy needs to be addressed at next visit Meds ordered this encounter  Medications  . topiramate (TOPAMAX) 50 MG tablet    Sig: Take 1 tablet (50 mg total) by mouth 2 (two) times daily.    Dispense:  60 tablet    Refill:  2    Follow-up: Return in about 3 months (around 11/13/2019) for medical conditions.       Charlott Rakes, MD, FAAFP. [facility] [place], [place]   08/15/2019, 11:25 AM

## 2019-08-15 NOTE — Progress Notes (Signed)
Patient here for 6 week follow up.  Is walking a little better. Not having to use cane/Serapio Edelson.

## 2019-08-15 NOTE — Patient Instructions (Signed)
Trigeminal Neuralgia  Trigeminal neuralgia is a nerve disorder that causes severe pain on one side of the face. The pain may last from a few seconds to several minutes. The pain is usually only on one side of the face. Symptoms may occur for days, weeks, or months and then go away for months or years. The pain may return and be worse than before. What are the causes? This condition is caused by damage or pressure to a nerve in the head that is called the trigeminal nerve. An attack can be triggered by:  Talking.  Chewing.  Putting on makeup.  Washing your face.  Shaving your face.  Brushing your teeth.  Touching your face. What increases the risk? You are more likely to develop this condition if you:  Are 50 years of age or older.  Are female. What are the signs or symptoms? The main symptom of this condition is severe pain in the:  Jaw.  Lips.  Eyes.  Nose.  Scalp.  Forehead.  Face. The pain may be:  Intense.  Stabbing.  Electric.  Shock-like. How is this diagnosed? This condition is diagnosed with a physical exam. A CT scan or an MRI may be done to rule out other conditions that can cause facial pain. How is this treated? This condition may be treated with:  Avoiding the things that trigger your symptoms.  Taking prescription medicines (anticonvulsants).  Having surgery. This may be done in severe cases if other medical treatment does not provide relief.  Having procedures such as ablation, thermal, or radiation therapy. It may take up to one month for treatment to start relieving the pain. Follow these instructions at home: Managing pain  Learn as much as you can about how to manage your pain. Ask your health care provider if a pain specialist would be helpful.  Consider talking with a mental health care provider (psychologist) about how to cope with the pain.  Consider joining a pain support group. General instructions  Take  over-the-counter and prescription medicines only as told by your health care provider.  Avoid the things that trigger your symptoms. It may help to: ? Chew on the unaffected side of your mouth. ? Avoid touching your face. ? Avoid blasts of hot or cold air.  Follow your treatment plan as told by your health care provider. This may include: ? Cognitive or behavioral therapy. ? Gentle, regular exercise. ? Meditation or yoga. ? Aromatherapy.  Keep all follow-up visits as told by your health care provider. You may need to be monitored closely to make sure treatment is working well for you. Where to find more information  Facial Pain Association: fpa-support.org Contact a health care provider if:  Your medicine is not helping your symptoms.  You have side effects from the medicine used for treatment.  You develop new, unexplained symptoms, such as: ? Double vision. ? Facial weakness. ? Facial numbness. ? Changes in hearing or balance.  You feel depressed. Get help right away if:  Your pain is severe and is not getting better.  You develop suicidal thoughts. If you ever feel like you may hurt yourself or others, or have thoughts about taking your own life, get help right away. You can go to your nearest emergency department or call:  Your local emergency services (911 in the [place]  A suicide crisis helpline, such as the National Suicide Prevention Lifeline at 1-800-273-8255. This is open 24 hours a day. Summary  Trigeminal neuralgia is a   nerve disorder that causes severe pain on one side of the face. The pain may last from a few seconds to several minutes.  This condition is caused by damage or pressure to a nerve in the head that is called the trigeminal nerve.  Treatment may include avoiding the things that trigger your symptoms, taking medicines, or having surgery or procedures. It may take up to one month for treatment to start relieving the pain.  Avoid the things that  trigger your symptoms.  Keep all follow-up visits as told by your health care provider. You may need to be monitored closely to make sure treatment is working well for you. This information is not intended to replace advice given to you by your health care provider. Make sure you discuss any questions you have with your health care provider. Document Released: 08/26/2000 Document Revised: 07/16/2018 Document Reviewed: 07/16/2018 Elsevier Patient Education  2020 Elsevier Inc.  

## 2019-08-15 NOTE — Progress Notes (Deleted)
Patient here for 6 week follow up

## 2019-08-16 LAB — HEMOGLOBIN A1C
Est. average glucose Bld gHb Est-mCnc: 111 mg/dL
Hgb A1c MFr Bld: 5.5 % (ref 4.8–5.6)

## 2019-08-20 ENCOUNTER — Ambulatory Visit: Payer: Medicare Other | Attending: Family Medicine | Admitting: Physical Therapy

## 2019-08-20 ENCOUNTER — Other Ambulatory Visit: Payer: Self-pay

## 2019-08-20 ENCOUNTER — Encounter: Payer: Self-pay | Admitting: Physical Therapy

## 2019-08-20 DIAGNOSIS — R2681 Unsteadiness on feet: Secondary | ICD-10-CM

## 2019-08-20 DIAGNOSIS — M6281 Muscle weakness (generalized): Secondary | ICD-10-CM | POA: Insufficient documentation

## 2019-08-20 DIAGNOSIS — R2689 Other abnormalities of gait and mobility: Secondary | ICD-10-CM | POA: Diagnosis not present

## 2019-08-20 NOTE — Therapy (Signed)
[place], [place], [address] Phone: [phone]   Fax:  [phone]  Physical Therapy Evaluation  Patient Details  Name: Brittney Maddox MRN: [id number] Date of Birth: 1966-01-21 Referring Provider (PT): Charlott Rakes, MD   Encounter Date: 08/20/2019  PT End of Session - 08/20/19 0859    Visit Number  1    Number of Visits  13    Date for PT Re-Evaluation  10/01/19    Authorization Type  MCR: KX mod at 15th visit, progress note at 10th visit    PT Start Time  0845    PT Stop Time  0932    PT Time Calculation (min)  47 min    Activity Tolerance  Patient tolerated treatment well    Behavior During Therapy  [facility] for tasks assessed/performed       Past Medical History:  Diagnosis Date  . Allergy   . Asthma   . Lupus ([place])   . Myocardial infarction ([place])   . Rheumatoid arthritis ([place])   . Trigeminal neuralgia     Past Surgical History:  Procedure Laterality Date  . ABDOMINAL HYSTERECTOMY    . CESAREAN SECTION    . NERVE AND TENDON REPAIR Right   . ROTATOR CUFF REPAIR Left     There were no vitals filed for this visit.   Subjective Assessment - 08/20/19 0853    Subjective  pt is a 53 y.o with CC of issues with balance, hip/ knee pain and stiffness that began back in September. pt reports feeling it is related lots of stress and drving back and forth to her moms and working as a [employer]ar[employer] manager. She reports having issues with memory and fog, difficulty with vision, and HA that started in September.    Limitations  Standing;Lifting;Walking    How long can you sit comfortably?  10 min    How long can you stand comfortably?  2-3 min    How long can you walk comfortably?  2-3 min    Diagnostic tests  07/01/2019 Impression: These findings are consistent with all osteoarthritis and mild chondromalacia patella.    Patient Stated Goals  improve balance, decrease pain    Currently in Pain?  Yes    Pain Score  7      Pain Location  Leg   hips and knees   Pain Orientation  Right;Left    Pain Descriptors / Indicators  Aching;Sore    Pain Type  Chronic pain    Pain Onset  More than a month ago    Pain Frequency  Intermittent    Aggravating Factors   walking/ standing, bending the knee    Pain Relieving Factors  medication, intermittent compression         [facility] PT Assessment - 08/20/19 0851      Assessment   Medical Diagnosis  Other systemic lupus erythematosus with other organ involvement,  Arthralgia, unspecified joint     Referring Provider (PT)  Charlott Rakes, MD    Onset Date/Surgical Date  --   September 2020   Hand Dominance  Right    Next MD Visit  --   3 months   Prior Therapy  yes      Precautions   Precautions  None      Restrictions   Weight Bearing Restrictions  No      Balance Screen   Has the patient fallen in the past 6 months  No  Home Environment   Living Environment  Private residence    Living Arrangements  Children    Available Help at Discharge  Family    Type of [place] to enter    Entrance Stairs-Number of Steps  5    Entrance Stairs-Rails  Right   ascending   [place]  One level    [place] - 2 wheels;Wheelchair - manual      Prior Function   Level of Independence  Needs assistance with gait;Needs assistance with homemaking;Needs assistance with ADLs    Dressing  Minimal    Laundry  Moderate    Vacuuming  Moderate    Vocation  Full time employment   CNA at [employer]   Vocation Requirements  lifting, standing, walking, moving around      Cognition   Overall Cognitive Status  Difficult to assess    Memory  Impaired    Memory Impairment  Storage deficit;Decreased recall of new information;Decreased short term memory      Sensation   Light Touch  Impaired Detail    Light Touch Impaired Details  Impaired RLE   RLE along L3-L5, LLE along L1     ROM / Strength   AROM / PROM / Strength   AROM;Strength;PROM      AROM   Overall AROM   Other (comment)   limited secondary to weakness   AROM Assessment Site  Hip;Knee    Right/Left Hip  Right;Left    Right/Left Knee  Right;Left   assessed in sitting   Right Knee Extension  0    Right Knee Flexion  --   90   Left Knee Extension  0    Left Knee Flexion  105      PROM   PROM Assessment Site  Knee    Right/Left Knee  Right;Left   assess in sitting   Right Knee Flexion  98      Strength   Strength Assessment Site  Knee;Hip    Right/Left Hip  Right;Left    Right Hip Flexion  3-/5    Right Hip Extension  3-/5    Right Hip ABduction  3-/5    Right Hip ADduction  3-/5    Left Hip Flexion  3-/5    Left Hip Extension  3-/5    Left Hip ABduction  3-/5    Left Hip ADduction  3-/5    Right/Left Knee  Right;Left    Right Knee Flexion  3-/5    Right Knee Extension  3-/5    Left Knee Flexion  3-/5    Left Knee Extension  3-/5      Palpation   Palpation comment  TTP along the bil lateral patellar pole, and at bil SIJ      Ambulation/Gait   Ambulation/Gait  Yes    Gait Pattern  Step-to pattern;Decreased stride length;Trendelenburg;Antalgic;Trunk flexed;Shuffle;Narrow base of support    Gait Comments  utilized wall to assist with balance while walking back to treatment area                Objective measurements completed on examination: See above findings.      [facility] Adult PT Treatment/Exercise - 08/20/19 0851      Exercises   Exercises  Knee/Hip      Knee/Hip Exercises: Seated   Long Arc Quad  Both;Left;Right;1 set;10 reps    Other Seated Knee/Hip Exercises  heel/  toe raise 2 x 10    Other Seated Knee/Hip Exercises  unresisted clam shell 1 x 10    Marching  1 set;5 reps;Both;Strengthening   cues to avoid posterior trunk lean            PT Education - 08/20/19 1000    Education Details  evaluation findings, POC, goals, HEP with proper form/ rationale. benefits of seeing a neurologist. bring RW  with her next session.    Person(s) Educated  Patient    Methods  Explanation;Verbal cues;Handout    Comprehension  Verbalized understanding;Verbal cues required       PT Short Term Goals - 08/20/19 0951      PT SHORT TERM GOAL #1   Title  pt to be I with inital HEP    Time  3    Period  Weeks    Status  New    Target Date  09/10/19      PT SHORT TERM GOAL #2   Title  assess BERG balance and adjust LTG based on assessment    Time  1    Period  Weeks    Status  New    Target Date  08/27/19        PT Long Term Goals - 08/20/19 0954      PT LONG TERM GOAL #1   Title  pt to increase gross hip strengtht o >/= 4/5 to promote safety with walking/ standing    Time  6    Period  Weeks    Status  New    Target Date  10/01/19      PT LONG TERM GOAL #2   Title  pt to be able to walk/ stand >/=30 min and navigate u/dwon >/= 6 steps with LRAD for functional endurance and safety for in home/ community ambulation    Time  6    Period  Weeks    Status  New    Target Date  10/01/19      PT LONG TERM GOAL #3   Title  pt to be I with all HEP given as of last visit to maintain and progress current level of function    Time  6    Period  Weeks    Status  New    Target Date  10/01/19             Plan - 08/20/19 0941    Clinical Impression Statement  pt presents to OPPT with CC of feeling off balance, general LE weakness with a dx for lupus and difficutly with ambulation. She demonstrates limited hip and knee ROM secondary to weakness. she notes distrubances in bil LE dermatomes R L3-L5, and L L1 compared bil. pt would benefit from physical therapy to work on safety with ambulation/ stairs, hip/ knee ROM and strength and maximize function by addressing the defictis listed, but  pt also notes having issues with memory recall, visual disturbances and HA and given the mulitple unrelated symptomology would also benefit from seeing a neurologist.    Personal Factors and Comorbidities   Comorbidity 3+;Age;Sex    Comorbidities  Hx of MI, lupus, and RA    Examination-Activity Limitations  Stand;Stairs;Squat;Locomotion Level;Bend;Bathing    Stability/Clinical Decision Making  Unstable/Unpredictable    Clinical Decision Making  High    Rehab Potential  Good    PT Frequency  2x / week    PT Duration  6 weeks    PT Treatment/Interventions  ADLs/Self  Care Home Management;Cryotherapy;Electrical Stimulation;Iontophoresis 4mg /ml Dexamethasone;Moist Heat;Ultrasound;Gait training;Stair training;Functional mobility training;Therapeutic activities;Therapeutic exercise;Balance training;Neuromuscular re-education;Manual techniques;Passive range of motion;Taping;Dry needling    PT Next Visit Plan  review/ update HEP PRN, BERG balance, gait training with RW and stair training, review benefits of using walking for safety, hip/ knee ROM and strengthening    PT [place] - seated clamshells, LAQ, marching and heel/ toe raises    Recommended Other Services  Consult with a neurologist    Consulted and Agree with Plan of Care  Patient       Patient will benefit from skilled therapeutic intervention in order to improve the following deficits and impairments:  Abnormal gait, Pain, Improper body mechanics, Postural dysfunction, Decreased strength, Difficulty walking, Decreased balance, Decreased activity tolerance, Decreased coordination, Decreased endurance, Decreased range of motion, Decreased mobility  Visit Diagnosis: Other abnormalities of gait and mobility  Muscle weakness (generalized)  Unsteadiness on feet     Problem List Patient Active Problem List   Diagnosis Date Noted  . Cervicalgia 06/11/2019  . Right trigeminal neuralgia 06/11/2019  . Prediabetes 08/20/2013  . Elevated LFTs 08/20/2013  . Fatty liver 08/20/2013  . Vaginitis and vulvovaginitis 07/12/2013  . 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 Lupus (systemic lupus erythematosus) ([place]) 06/24/2013    Starr Lake PT, DPT, LAT, ATC  08/20/19  10:01 AM      [employer] [facility] [address] [place], [place], [address] Phone: [phone]   Fax:  [phone]  Name: Brittney Maddox MRN: [id number] Date of Birth: 1966/08/23

## 2019-08-21 NOTE — Progress Notes (Signed)
Virtual Visit via Telephone Note  I connected with Brittney Maddox on 08/22/19 at  2:15 PM EST by telephone and verified that I am speaking with the correct person using two identifiers.  Location: Patient: Home  Provider: Clinic  This service was conducted via virtual visit.  The patient was located at home. I was located in my office.  Consent was obtained prior to the virtual visit and is aware of possible charges through their insurance for this visit.  The patient is an established patient.  Dr. Estanislado Pandy, MD conducted the virtual visit and Hazel Sams, PA-C acted as scribe during the service.  Office staff helped with scheduling follow up visits after the service was conducted.     I discussed the limitations, risks, security and privacy concerns of performing an evaluation and management service by telephone and the availability of in person appointments. I also discussed with the patient that there may be a patient responsible charge related to this service. The patient expressed understanding and agreed to proceed.  CC: Pain in both ankle joints History of Present Illness:  Patient is a 21 yea old female with past medical history of systemic lupus erythematosus. She started on imuran 50 mg 1/2 tablet by mouth daily on 08/19/19.  She continues to take plaquenil 200 mg 1 tablet BID. She is currently on a prednisone taper, which was prescribed by Dr. Benson Norway.   She has persistent pain and inflammation in both knee joints and ankle joints.  She started physical therapy this week. She is using a walker to assist with ambulation.  She was evaluated by Dr. Brett Fairy for right trigeminal neuralgia on 08/01/19.  She has not found any medications to be effective at alleviating the discomfort.  She was given a prednisone taper and was referred to a tertiary care center for further evaluation.     She was evaluated in the ED on 08/05/19 for a fever related to URI symptoms.   Review of Systems   Constitutional: Positive for malaise/fatigue. Negative for fever.  HENT:       +Dry mouth  Eyes: Negative for photophobia, pain, discharge and redness.       +Dry eyes-using Refresh  Respiratory: Negative for cough, shortness of breath and wheezing.   Cardiovascular: Negative for chest pain and palpitations.  Gastrointestinal: Negative for blood in stool, constipation and diarrhea.  Genitourinary: Negative for dysuria.  Musculoskeletal: Positive for joint pain. Negative for back pain, myalgias and neck pain.       +Joint swelling +Joint stiffness   Skin: Negative for rash.  Neurological: Positive for headaches. Negative for dizziness.  Psychiatric/Behavioral: Negative for depression. The patient is not nervous/anxious and does not have insomnia.       Observations/Objective: Physical Exam  Constitutional: She is oriented to person, place, and time.  Neurological: She is alert and oriented to person, place, and time.  Psychiatric: Mood, memory, affect and judgment normal.  Patient had intermittent problems with  word finding.  Patient reports joint stiffness all day   Patient reports nocturnal pain.  Difficulty dressing/grooming: Denies Difficulty climbing stairs: Reports Difficulty getting out of chair: Reports Difficulty using hands for taps, buttons, cutlery, and/or writing: Denies   Assessment and Plan: Diagnoses and all orders for this visit:  Other organ or system involvement in systemic lupus erythematosus ([place]):  diagnosed in 2000 while in [place] and treated with prednisone and Plaquenil: She started on Imuran 50 mg 1/2 tablet daily on 08/19/19 and has been  tolerating it without any side effects.  She continues to take plaquenil 200 mg 1 tablet by mouth twice daily.  She is on a prednisone taper prescribed by Dr. Benson Norway due to elevated LFTs suspicious for autoimmune hepatitis.  She continues to have persistent pain and inflammation in bilateral knee joints and bilateral  ankle joints.  She has not had any recent rashes, oral or nasal ulcers, or symptoms of Raynaud's.  She has chronic fatigue related to insomnia and a recent URI.  She continues to have memory loss and confusion, so we will refer her to neurology and rheumatology at [facility] for further evaluation and treatment since she has not had any clinical improvement on prednisone, Imuran, or plaquenil.  She will follow up as needed.   High risk medication use: She is taking plaquenil 200 mg 1 tablet by mouth twice daily and Imuran 50 mg 1/2 tablet daily.  She is on a prednisone taper prescribed by Dr. Benson Norway.  Long term (current) use of systemic steroids: She is currently taking a prednisone taper.  Right trigeminal neuralgia: She was evaluated by Dr. Brett Fairy on 08/01/19. According to her office visit note, Dr. Brett Fairy felt that the trigeminal neuralgia may be related to an autoimmune response.  She had an inadequate response to all first line treatments and prednisone.  We will refer her to neurology at [facility] per recommendation of Dr. Brett Fairy.   Memory loss: She continues to have memory loss and confusion.  She was evaluated by Dr. Brett Fairy, and according to her note her speech pattern and gait demonstration are not organic.  There was no focal neurologic deficit and MRI did not show any abnormality.    Cervicalgia: She has chronic neck pain and stiffness.   Elevated LFTs: She was evaluated by Dr. Benson Norway.  She is currently taking a prednisone taper.   Other medical conditions are listed as follows:   Fatty liver  Prediabetes     Follow Up Instructions: She will follow up PRN   I discussed the assessment and treatment plan with the patient. The patient was provided an opportunity to ask questions and all were answered. The patient agreed with the plan and demonstrated an understanding of the instructions.   The patient was advised to call back or seek an in-person evaluation if the symptoms worsen or if  the condition fails to improve as anticipated.  I provided 25 minutes of non-face-to-face time during this encounter.   Bo Merino, MD   Scribed by-  Hazel Sams, PA-C

## 2019-08-22 ENCOUNTER — Telehealth (INDEPENDENT_AMBULATORY_CARE_PROVIDER_SITE_OTHER): Payer: Medicare Other | Admitting: Rheumatology

## 2019-08-22 ENCOUNTER — Encounter: Payer: Self-pay | Admitting: Rheumatology

## 2019-08-22 ENCOUNTER — Other Ambulatory Visit: Payer: Self-pay

## 2019-08-22 DIAGNOSIS — M542 Cervicalgia: Secondary | ICD-10-CM

## 2019-08-22 DIAGNOSIS — M3219 Other organ or system involvement in systemic lupus erythematosus: Secondary | ICD-10-CM | POA: Diagnosis not present

## 2019-08-22 DIAGNOSIS — G500 Trigeminal neuralgia: Secondary | ICD-10-CM | POA: Diagnosis not present

## 2019-08-22 DIAGNOSIS — R7989 Other specified abnormal findings of blood chemistry: Secondary | ICD-10-CM

## 2019-08-22 DIAGNOSIS — R7303 Prediabetes: Secondary | ICD-10-CM

## 2019-08-22 DIAGNOSIS — R413 Other amnesia: Secondary | ICD-10-CM | POA: Diagnosis not present

## 2019-08-22 DIAGNOSIS — K760 Fatty (change of) liver, not elsewhere classified: Secondary | ICD-10-CM

## 2019-08-22 DIAGNOSIS — Z7952 Long term (current) use of systemic steroids: Secondary | ICD-10-CM

## 2019-08-22 DIAGNOSIS — Z79899 Other long term (current) drug therapy: Secondary | ICD-10-CM

## 2019-08-23 ENCOUNTER — Other Ambulatory Visit: Payer: Self-pay | Admitting: Family Medicine

## 2019-08-28 ENCOUNTER — Ambulatory Visit: Payer: Medicare Other | Admitting: Physical Therapy

## 2019-08-30 ENCOUNTER — Ambulatory Visit: Payer: Medicare Other | Admitting: Physical Therapy

## 2019-08-30 ENCOUNTER — Other Ambulatory Visit: Payer: Self-pay

## 2019-08-30 DIAGNOSIS — R2681 Unsteadiness on feet: Secondary | ICD-10-CM

## 2019-08-30 DIAGNOSIS — R2689 Other abnormalities of gait and mobility: Secondary | ICD-10-CM

## 2019-08-30 DIAGNOSIS — M6281 Muscle weakness (generalized): Secondary | ICD-10-CM

## 2019-08-30 NOTE — Therapy (Signed)
[place], [place], [address] Phone: [phone]   Fax:  [phone]  Physical Therapy Treatment  Patient Details  Name: Brittney Maddox MRN: [id number] Date of Birth: 07-30-1966 Referring Provider (PT): Charlott Rakes, MD   Encounter Date: 08/30/2019  PT End of Session - 08/30/19 1009    Visit Number  2    Number of Visits  13    Date for PT Re-Evaluation  10/01/19    Authorization Type  MCR: KX mod at 15th visit, progress note at 10th visit    PT Start Time  1001    PT Stop Time  1044    PT Time Calculation (min)  43 min    Activity Tolerance  Patient tolerated treatment well    Behavior During Therapy  [facility] for tasks assessed/performed       Past Medical History:  Diagnosis Date  . Allergy   . Asthma   . Lupus ([place])   . Myocardial infarction ([place])   . Rheumatoid arthritis ([place])   . Trigeminal neuralgia     Past Surgical History:  Procedure Laterality Date  . ABDOMINAL HYSTERECTOMY    . CESAREAN SECTION    . NERVE AND TENDON REPAIR Right   . ROTATOR CUFF REPAIR Left     There were no vitals filed for this visit.  Subjective Assessment - 08/30/19 1006    Subjective  " I brought my RW with since you asked me to last time. I am having soreness in my ankles and behind my knees"    Diagnostic tests  07/01/2019 Impression: These findings are consistent with all osteoarthritis and mild chondromalacia patella.    Patient Stated Goals  improve balance, decrease pain    Currently in Pain?  Yes    Pain Score  7     Pain Location  Leg    Pain Orientation  Right;Left    Pain Descriptors / Indicators  Aching;Sore    Pain Type  Chronic pain    Aggravating Factors   walking/ standing, bending of the knees    Pain Relieving Factors  medication, intemrittent compression         [facility] PT Assessment - 08/30/19 0001      Assessment   Medical Diagnosis  Other systemic lupus erythematosus with other  organ involvement,  Arthralgia, unspecified joint       Standardized Balance Assessment   Standardized Balance Assessment  Berg Balance Test      Berg Balance Test   Sit to Stand  Able to stand  independently using hands    Standing Unsupported  Able to stand 2 minutes with supervision    Sitting with Back Unsupported but Feet Supported on Floor or Stool  Able to sit 30 seconds    Stand to Sit  Uses backs of legs against chair to control descent    Transfers  Able to transfer with verbal cueing and /or supervision    Standing Unsupported with Eyes Closed  Unable to keep eyes closed 3 seconds but stays steady    Standing Unsupported with Feet Together  Able to place feet together independently but unable to hold for 30 seconds    From Standing, Reach Forward with Outstretched Arm  Reaches forward but needs supervision    From Standing Position, Pick up Object from Floor  Able to pick up shoe, needs supervision    From Standing Position, Turn to Look Behind Over each Shoulder  Needs supervision when turning    Turn 360 Degrees  Needs close supervision or verbal cueing    Standing Unsupported, Alternately Place Feet on Step/Stool  Needs assistance to keep from falling or unable to try    Standing Unsupported, One Foot in [place] help to step but can hold 15 seconds    Standing on One Leg  Unable to try or needs assist to prevent fall    Total Score  22                   [facility] Adult PT Treatment/Exercise - 08/30/19 0001      Self-Care   Self-Care  Other Self-Care Comments    Other Self-Care Comments   adjusted and discussed proper RW height and use with sit to stand transition      Knee/Hip Exercises: Stretches   Active Hamstring Stretch  2 reps;30 seconds;Both      Knee/Hip Exercises: Aerobic   Nustep  L3 x 5 min UE/LE   cues to push throughout bil LE     Knee/Hip Exercises: Seated   Long Arc Quad  2 sets;10 reps;Weights;Both   2#   Knee/Hip Flexion  hip flexion 2  x 10   tactile cues for proper height              PT Short Term Goals - 08/30/19 1134      PT SHORT TERM GOAL #1   Title  pt to be I with inital HEP    Period  Weeks    Status  On-going      PT SHORT TERM GOAL #2   Title  assess BERG balance and adjust LTG based on assessment    Period  Weeks    Status  Achieved        PT Long Term Goals - 08/30/19 1134      PT LONG TERM GOAL #1   Title  pt to increase gross hip strengtht o >/= 4/5 to promote safety with walking/ standing    Period  Weeks    Status  On-going      PT LONG TERM GOAL #2   Title  pt to be able to walk/ stand >/=30 min and navigate u/dwon >/= 6 steps with LRAD for functional endurance and safety for in home/ community ambulation    Period  Weeks    Status  On-going      PT LONG TERM GOAL #3   Title  pt to be I with all HEP given as of last visit to maintain and progress current level of function    Period  Weeks    Status  On-going      PT LONG TERM GOAL #4   Title  increase Berg balance to >/= 40/56 to demo improvment in balance and stability    Time  6    Period  Weeks    Status  New    Target Date  10/11/19            Plan - 08/30/19 1130    Clinical Impression Statement  pt reports consistency with her HEP but does continue to report 7/10 pain inthe legs and back. She did use her RW today as requested last session. She did require proper education using RW and required adjustment to appropriate height. Berg balance revealed significant instablity scoreing 22/56 indicating high fall risk. continued working on hip/ knee strength which she did require cues for proper form.  She reported pain dropped to 5/10 from 7 end of session.    Examination-Activity Limitations  Stand;Stairs;Squat;Locomotion Level;Bend;Bathing    PT Treatment/Interventions  ADLs/Self Care Home Management;Cryotherapy;Electrical Stimulation;Iontophoresis 4mg /ml Dexamethasone;Moist Heat;Ultrasound;Gait training;Stair  training;Functional mobility training;Therapeutic activities;Therapeutic exercise;Balance training;Neuromuscular re-education;Manual techniques;Passive range of motion;Taping;Dry needling    PT Next Visit Plan  review/ update HEP PRN,  gait training with RW and stair training, review benefits of using walking for safety, hip/ knee ROM and strengthening    PT [place] - seated clamshells, LAQ, marching and heel/ toe raises    Consulted and Agree with Plan of Care  Patient       Patient will benefit from skilled therapeutic intervention in order to improve the following deficits and impairments:  Abnormal gait, Pain, Improper body mechanics, Postural dysfunction, Decreased strength, Difficulty walking, Decreased balance, Decreased activity tolerance, Decreased coordination, Decreased endurance, Decreased range of motion, Decreased mobility  Visit Diagnosis: Other abnormalities of gait and mobility  Muscle weakness (generalized)  Unsteadiness on feet     Problem List Patient Active Problem List   Diagnosis Date Noted  . Cervicalgia 06/11/2019  . Right trigeminal neuralgia 06/11/2019  . Prediabetes 08/20/2013  . Elevated LFTs 08/20/2013  . Fatty liver 08/20/2013  . Vaginitis and vulvovaginitis 07/12/2013  . 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 Lupus (systemic lupus erythematosus) ([place]) 06/24/2013    Brittney Maddox 08/30/2019, 11:38 AM  [facility] [address] [place], [place], [address] Phone: [phone]   Fax:  [phone]  Name: Brittney Maddox MRN: [id number] Date of Birth: [birth year]

## 2019-08-31 ENCOUNTER — Other Ambulatory Visit: Payer: Self-pay | Admitting: Rheumatology

## 2019-08-31 DIAGNOSIS — M3219 Other organ or system involvement in systemic lupus erythematosus: Secondary | ICD-10-CM

## 2019-09-02 ENCOUNTER — Other Ambulatory Visit: Payer: Self-pay

## 2019-09-02 DIAGNOSIS — Z79899 Other long term (current) drug therapy: Secondary | ICD-10-CM

## 2019-09-03 ENCOUNTER — Ambulatory Visit: Payer: Medicare Other | Admitting: Physical Therapy

## 2019-09-03 ENCOUNTER — Telehealth: Payer: Self-pay | Admitting: Physical Therapy

## 2019-09-03 NOTE — Telephone Encounter (Signed)
LVM regarding missed appointment today. Noted when her next appointment is and if she cannot make that appointment to call us and we can cancel or reschedule that appointment for her.

## 2019-09-04 ENCOUNTER — Other Ambulatory Visit: Payer: Self-pay | Admitting: Family Medicine

## 2019-09-04 LAB — CBC WITH DIFFERENTIAL/PLATELET
Absolute Monocytes: 31 cells/uL — ABNORMAL LOW (ref 200–950)
Basophils Absolute: 10 cells/uL (ref 0–200)
Basophils Relative: 0.3 %
Eosinophils Absolute: 10 cells/uL — ABNORMAL LOW (ref 15–500)
Eosinophils Relative: 0.3 %
HCT: 35.6 % (ref 35.0–45.0)
Hemoglobin: 11.6 g/dL — ABNORMAL LOW (ref 11.7–15.5)
Lymphs Abs: 558 cells/uL — ABNORMAL LOW (ref 850–3900)
MCH: 29 pg (ref 27.0–33.0)
MCHC: 32.6 g/dL (ref 32.0–36.0)
MCV: 89 fL (ref 80.0–100.0)
MPV: 11.5 fL (ref 7.5–12.5)
Monocytes Relative: 0.9 %
Neutro Abs: 2791 cells/uL (ref 1500–7800)
Neutrophils Relative %: 82.1 %
Platelets: 230 10*3/uL (ref 140–400)
RBC: 4 10*6/uL (ref 3.80–5.10)
RDW: 13.7 % (ref 11.0–15.0)
Total Lymphocyte: 16.4 %
WBC: 3.4 10*3/uL — ABNORMAL LOW (ref 3.8–10.8)

## 2019-09-04 LAB — COMPLETE METABOLIC PANEL WITH GFR
AG Ratio: 1.4 (calc) (ref 1.0–2.5)
ALT: 11 U/L (ref 6–29)
AST: 15 U/L (ref 10–35)
Albumin: 4.2 g/dL (ref 3.6–5.1)
Alkaline phosphatase (APISO): 107 U/L (ref 37–153)
BUN: 12 mg/dL (ref 7–25)
CO2: 22 mmol/L (ref 20–32)
Calcium: 9.4 mg/dL (ref 8.6–10.4)
Chloride: 109 mmol/L (ref 98–110)
Creat: 0.75 mg/dL (ref 0.50–1.05)
GFR, Est African American: 104 mL/min/{1.73_m2} (ref >=60)
GFR, Est Non African American: 90 mL/min/{1.73_m2} (ref >=60)
Globulin: 2.9 g/dL (calc) (ref 1.9–3.7)
Glucose, Bld: 136 mg/dL — ABNORMAL HIGH (ref 65–99)
Potassium: 4.3 mmol/L (ref 3.5–5.3)
Sodium: 141 mmol/L (ref 135–146)
Total Bilirubin: 0.3 mg/dL (ref 0.2–1.2)
Total Protein: 7.1 g/dL (ref 6.1–8.1)

## 2019-09-04 NOTE — Progress Notes (Signed)
Glucose is elevated-136.  Rest of CMP WNL.  WBC count is low-3.4 Hgb is 11.6-borderline low.  Reviewed labs with Dr. Estanislado Pandy.  She would like the patient to return in 1 month to recheck CBC

## 2019-09-04 NOTE — Telephone Encounter (Signed)
ok 

## 2019-09-04 NOTE — Telephone Encounter (Signed)
Last Visit: 08/22/2019 Next Visit: patient is scheduled with [facility] on 09/09/2019 Labs: 09/03/2019 Glucose is elevated-136. Rest of CMP WNL. WBC count is low-3.4 Hgb is 11.6-borderline low. Reviewed labs with Dr. Estanislado Pandy. She would like the patient to return in 1 month to recheck CBC Eye exam:  07/12/2019  Okay to refill PLQ?

## 2019-09-05 ENCOUNTER — Other Ambulatory Visit: Payer: Self-pay

## 2019-09-05 ENCOUNTER — Encounter: Payer: Self-pay | Admitting: Physical Therapy

## 2019-09-05 ENCOUNTER — Ambulatory Visit: Payer: Medicare Other | Admitting: Physical Therapy

## 2019-09-05 DIAGNOSIS — R2681 Unsteadiness on feet: Secondary | ICD-10-CM

## 2019-09-05 DIAGNOSIS — M6281 Muscle weakness (generalized): Secondary | ICD-10-CM

## 2019-09-05 DIAGNOSIS — R2689 Other abnormalities of gait and mobility: Secondary | ICD-10-CM | POA: Diagnosis not present

## 2019-09-05 NOTE — Therapy (Signed)
[place], [place], [address] Phone: [phone]   Fax:  [phone]  Physical Therapy Treatment  Patient Details  Name: Brittney Maddox MRN: [id number] Date of Birth: 1966-05-22 Referring Provider (PT): Charlott Rakes, MD   Encounter Date: 09/05/2019  PT End of Session - 09/05/19 1016    Visit Number  3    Number of Visits  13    Date for PT Re-Evaluation  10/01/19    Authorization Type  MCR: KX mod at 15th visit, progress note at 10th visit    PT Start Time  1015    PT Stop Time  1058    PT Time Calculation (min)  43 min    Activity Tolerance  Patient tolerated treatment well       Past Medical History:  Diagnosis Date  . Allergy   . Asthma   . Lupus ([place])   . Myocardial infarction ([place])   . Rheumatoid arthritis ([place])   . Trigeminal neuralgia     Past Surgical History:  Procedure Laterality Date  . ABDOMINAL HYSTERECTOMY    . CESAREAN SECTION    . NERVE AND TENDON REPAIR Right   . ROTATOR CUFF REPAIR Left     There were no vitals filed for this visit.  Subjective Assessment - 09/05/19 1017    Subjective  "I missed my last appointment due to car issues. I am doing better today, the last session helped"    Patient Stated Goals  improve balance, decrease pain    Currently in Pain?  Yes    Pain Score  0-No pain    Pain Orientation  Right;Left    Pain Onset  More than a month ago                       [facility] Adult PT Treatment/Exercise - 09/05/19 0001      Knee/Hip Exercises: Aerobic   Nustep  L4 x 6 min LE only      Knee/Hip Exercises: Supine   Heel Slides  Both;1 set;5 reps;Strengthening    Bridges  2 sets;10 reps   in recumbent position with ball between the knees   Straight Leg Raises  Strengthening;Both;1 set   halted due to limited SLR on the L and modifed to heel slide   Other Supine Knee/Hip Exercises  marching 1 x 10 bil, increased difficulty noted with the  RLE comapred bil requiring tactile cues    Other Supine Knee/Hip Exercises  clamshell 2 x 15 with red theraband               PT Short Term Goals - 08/30/19 1134      PT SHORT TERM GOAL #1   Title  pt to be I with inital HEP    Period  Weeks    Status  On-going      PT SHORT TERM GOAL #2   Title  assess BERG balance and adjust LTG based on assessment    Period  Weeks    Status  Achieved        PT Long Term Goals - 08/30/19 1134      PT LONG TERM GOAL #1   Title  pt to increase gross hip strengtht o >/= 4/5 to promote safety with walking/ standing    Period  Weeks    Status  On-going      PT LONG TERM GOAL #2   Title  pt  to be able to walk/ stand >/=30 min and navigate u/dwon >/= 6 steps with LRAD for functional endurance and safety for in home/ community ambulation    Period  Weeks    Status  On-going      PT LONG TERM GOAL #3   Title  pt to be I with all HEP given as of last visit to maintain and progress current level of function    Period  Weeks    Status  On-going      PT LONG TERM GOAL #4   Title  increase Berg balance to >/= 40/56 to demo improvment in balance and stability    Time  6    Period  Weeks    Status  New    Target Date  10/11/19            Plan - 09/05/19 1024    Clinical Impression Statement  pt arrived to her appointment today without her RW due to it being left in her other [place], She required MOD assist with gait ambulate for safety, and discussed importance of keeping the RW with her. continued working on hip/ knee strengthing she continues to fatigue quickly with exercise.    PT Treatment/Interventions  ADLs/Self Care Home Management;Cryotherapy;Electrical Stimulation;Iontophoresis 4mg /ml Dexamethasone;Moist Heat;Ultrasound;Gait training;Stair training;Functional mobility training;Therapeutic activities;Therapeutic exercise;Balance training;Neuromuscular re-education;Manual techniques;Passive range of motion;Taping;Dry  needling    PT Next Visit Plan  review/ update HEP PRN,  gait training with RW and stair training, review benefits of using walking for safety, hip/ knee ROM and strengthening    PT [place] - seated clamshells, LAQ, marching and heel/ toe raises    Consulted and Agree with Plan of Care  Patient       Patient will benefit from skilled therapeutic intervention in order to improve the following deficits and impairments:  Abnormal gait, Pain, Improper body mechanics, Postural dysfunction, Decreased strength, Difficulty walking, Decreased balance, Decreased activity tolerance, Decreased coordination, Decreased endurance, Decreased range of motion, Decreased mobility  Visit Diagnosis: Other abnormalities of gait and mobility  Muscle weakness (generalized)  Unsteadiness on feet     Problem List Patient Active Problem List   Diagnosis Date Noted  . Cervicalgia 06/11/2019  . Right trigeminal neuralgia 06/11/2019  . Prediabetes 08/20/2013  . Elevated LFTs 08/20/2013  . Fatty liver 08/20/2013  . Vaginitis and vulvovaginitis 07/12/2013  . 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 Lupus (systemic lupus erythematosus) ([place]) 06/24/2013   Starr [place] PT, DPT, LAT, ATC  09/05/19  11:01 AM      [facility] [address] [place], [place], [address] Phone: [phone]   Fax:  [phone]  Name: Brittney Maddox MRN: [id number] Date of Birth: [birth year]

## 2019-09-09 ENCOUNTER — Telehealth: Payer: Self-pay | Admitting: Physical Therapy

## 2019-09-09 ENCOUNTER — Ambulatory Visit: Payer: Medicare Other | Admitting: Physical Therapy

## 2019-09-09 ENCOUNTER — Other Ambulatory Visit: Payer: Self-pay | Admitting: Rheumatology

## 2019-09-09 NOTE — Telephone Encounter (Signed)
LVM regarding missed appointment today, and noted when her next scheduled appointment is and that is the last visit she has scheduled. If she cannot make that appointment to call and we can cancel or reschedule that appointment for her.

## 2019-09-09 NOTE — Telephone Encounter (Signed)
Patient called requesting prescription refill of Imuran to be sent to [facility] at [address] in [place].  Patient states she had her labwork last week.

## 2019-09-09 NOTE — Telephone Encounter (Signed)
Attempted to contact patient and left message on machine to advise patient to call the office.  

## 2019-09-10 ENCOUNTER — Telehealth: Payer: Self-pay | Admitting: Rheumatology

## 2019-09-10 MED ORDER — AZATHIOPRINE 50 MG PO TABS: 25.0000 mg | ORAL_TABLET | Freq: Every day | ORAL | 0 refills | Status: DC

## 2019-09-10 NOTE — Telephone Encounter (Signed)
See previous telephone encounter.

## 2019-09-10 NOTE — Telephone Encounter (Signed)
Ok to give 30-day supply.

## 2019-09-10 NOTE — Telephone Encounter (Signed)
Last Visit: 08/22/2019 telemedicine  Next Visit: 09/27/2019 with [facility]  Labs: 09/03/2019 Glucose is elevated-136. Rest of CMP WNL. WBC count is low-3.4 Hgb is 11.6-borderline low.   Okay to refill imuran?

## 2019-09-10 NOTE — Telephone Encounter (Signed)
Patient left a voicemail stating she was returning your call regarding her medication.   

## 2019-09-11 ENCOUNTER — Encounter: Payer: Self-pay | Admitting: Physical Therapy

## 2019-09-11 ENCOUNTER — Other Ambulatory Visit: Payer: Self-pay

## 2019-09-11 ENCOUNTER — Ambulatory Visit: Payer: Medicare Other | Admitting: Physical Therapy

## 2019-09-11 DIAGNOSIS — R2681 Unsteadiness on feet: Secondary | ICD-10-CM

## 2019-09-11 DIAGNOSIS — R2689 Other abnormalities of gait and mobility: Secondary | ICD-10-CM | POA: Diagnosis not present

## 2019-09-11 DIAGNOSIS — M6281 Muscle weakness (generalized): Secondary | ICD-10-CM

## 2019-09-11 NOTE — Therapy (Signed)
[place], [place], [address] Phone: [phone]   Fax:  [phone]  Physical Therapy Treatment  Patient Details  Name: KHARA HUEGEL MRN: [id number] Date of Birth: 1966-04-04 Referring Provider (PT): Charlott Rakes, MD   Encounter Date: 09/11/2019  PT End of Session - 09/11/19 1059    Visit Number  4    Number of Visits  13    Date for PT Re-Evaluation  10/01/19    Authorization Type  MCR: KX mod at 15th visit, progress note at 10th visit    PT Start Time  1059    PT Stop Time  1138    PT Time Calculation (min)  39 min    Equipment Utilized During Treatment  --   CGA for safety   Activity Tolerance  Patient tolerated treatment well    Behavior During Therapy  [facility] for tasks assessed/performed       Past Medical History:  Diagnosis Date  . Allergy   . Asthma   . Lupus ([place])   . Myocardial infarction ([place])   . Rheumatoid arthritis ([place])   . Trigeminal neuralgia     Past Surgical History:  Procedure Laterality Date  . ABDOMINAL HYSTERECTOMY    . CESAREAN SECTION    . NERVE AND TENDON REPAIR Right   . ROTATOR CUFF REPAIR Left     There were no vitals filed for this visit.  Subjective Assessment - 09/11/19 1059    Subjective  "I saw a new Rheumatologist and they are going to do an MRI on my head, thats why I missed my last appointment because the appointment was at the same time of my PT appointment"    Patient Stated Goals  improve balance, decrease pain    Currently in Pain?  Yes    Pain Score  7     Pain Location  Leg    Pain Orientation  Right    Pain Frequency  Intermittent    Aggravating Factors   walking/ standing    Pain Relieving Factors  medication         [facility] PT Assessment - 09/11/19 0001      Assessment   Medical Diagnosis  Other systemic lupus erythematosus with other organ involvement,  Arthralgia, unspecified joint                    [facility] Adult PT  Treatment/Exercise - 09/11/19 1107      Knee/Hip Exercises: Stretches   Active Hamstring Stretch  2 reps;30 seconds;Both      Knee/Hip Exercises: Aerobic   Nustep  L4 x 6 min UE/LE      Knee/Hip Exercises: Seated   Long Arc Quad  Strengthening;2 sets;15 reps;Both   3# on LLE   Knee/Hip Flexion  hip flexion 2 x 15   3# on the LLE, no weight on RLE   Other Seated Knee/Hip Exercises  heel raise 2 x 15 bil LE    Sit to Sand  Other (comment);2 sets;10 reps   from chair, fatigues quickly              PT Short Term Goals - 08/30/19 1134      PT SHORT TERM GOAL #1   Title  pt to be I with inital HEP    Period  Weeks    Status  On-going      PT SHORT TERM GOAL #2   Title  assess BERG balance  and adjust LTG based on assessment    Period  Weeks    Status  Achieved        PT Long Term Goals - 08/30/19 1134      PT LONG TERM GOAL #1   Title  pt to increase gross hip strengtht o >/= 4/5 to promote safety with walking/ standing    Period  Weeks    Status  On-going      PT LONG TERM GOAL #2   Title  pt to be able to walk/ stand >/=30 min and navigate u/dwon >/= 6 steps with LRAD for functional endurance and safety for in home/ community ambulation    Period  Weeks    Status  On-going      PT LONG TERM GOAL #3   Title  pt to be I with all HEP given as of last visit to maintain and progress current level of function    Period  Weeks    Status  On-going      PT LONG TERM GOAL #4   Title  increase Berg balance to >/= 40/56 to demo improvment in balance and stability    Time  6    Period  Weeks    Status  New    [place] Date  10/11/19            Plan - 09/11/19 1126    Clinical Impression Statement  pt reports she was seen by a new RA MD and that they plan get an MRI on her head. She continues to demonstrate multiple discordinant symptoms ranging from UE to LE and memory recall / vision. She does exhibit increased weakness in the R UE/LE compared bil. continued  focus on bil LE strengthening and balance training which she fatigues quickly and has decresaed motor control in the RLE       Patient will benefit from skilled therapeutic intervention in order to improve the following deficits and impairments:     Visit Diagnosis: Other abnormalities of gait and mobility  Muscle weakness (generalized)  Unsteadiness on feet     Problem List Patient Active Problem List   Diagnosis Date Noted  . Cervicalgia 06/11/2019  . Right trigeminal neuralgia 06/11/2019  . Prediabetes 08/20/2013  . Elevated LFTs 08/20/2013  . Fatty liver 08/20/2013  . Vaginitis and vulvovaginitis 07/12/2013  . 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 Lupus (systemic lupus erythematosus) ([place]) 06/24/2013   Starr Lake PT, DPT, LAT, ATC  09/11/19  11:42 AM      [place] [facility] [address] [place], [place], [address] Phone: [phone]   Fax:  [phone]  Name: NAKASHA MESAROS MRN: [id number] Date of Birth: 07/27/1966

## 2019-09-22 ENCOUNTER — Other Ambulatory Visit: Payer: Self-pay | Admitting: Family Medicine

## 2019-09-24 ENCOUNTER — Ambulatory Visit: Payer: Medicare Other | Attending: Family Medicine | Admitting: Physical Therapy

## 2019-09-24 ENCOUNTER — Encounter: Payer: Self-pay | Admitting: Physical Therapy

## 2019-09-24 ENCOUNTER — Other Ambulatory Visit: Payer: Self-pay

## 2019-09-24 DIAGNOSIS — M6281 Muscle weakness (generalized): Secondary | ICD-10-CM | POA: Insufficient documentation

## 2019-09-24 DIAGNOSIS — R2689 Other abnormalities of gait and mobility: Secondary | ICD-10-CM | POA: Insufficient documentation

## 2019-09-24 DIAGNOSIS — R2681 Unsteadiness on feet: Secondary | ICD-10-CM | POA: Insufficient documentation

## 2019-09-24 NOTE — Therapy (Signed)
[place], [place], [address] Phone: [phone]   Fax:  [phone]  Physical Therapy Treatment  Patient Details  Name: Brittney Maddox MRN: [id number] Date of Birth: Jan 23, 1966 Referring Provider (PT): Charlott Rakes, MD   Encounter Date: 09/24/2019  PT End of Session - 09/24/19 1006    Visit Number  4    Number of Visits  13    Date for PT Re-Evaluation  10/01/19    Authorization Type  MCR: KX mod at 15th visit, progress note at 10th visit    PT Start Time  1002    PT Stop Time  1045    PT Time Calculation (min)  43 min    Activity Tolerance  Patient tolerated treatment well    Behavior During Therapy  [facility] for tasks assessed/performed       Past Medical History:  Diagnosis Date  . Allergy   . Asthma   . Lupus ([place])   . Myocardial infarction ([place])   . Rheumatoid arthritis ([place])   . Trigeminal neuralgia     Past Surgical History:  Procedure Laterality Date  . ABDOMINAL HYSTERECTOMY    . CESAREAN SECTION    . NERVE AND TENDON REPAIR Right   . ROTATOR CUFF REPAIR Left     There were no vitals filed for this visit.  Subjective Assessment - 09/24/19 1002    Subjective  Patient reports she is doing well, and she feels most of her issues at this time is balance in the morning and pain and night. She is having a lot of headaches. She has been consistent working hard with her exercises.    Patient Stated Goals  improve balance, decrease pain    Currently in Pain?  Yes    Pain Score  0-No pain    Pain Location  Leg    Pain Orientation  Right    Pain Descriptors / Indicators  --    Pain Type  Chronic pain    Pain Onset  More than a month ago    Pain Frequency  Intermittent                       [facility] Adult PT Treatment/Exercise - 09/24/19 0001      Knee/Hip Exercises: Aerobic   Recumbent Bike  L3 x 5 min    Nustep  L5 x 6 min UE/LE      Knee/Hip Exercises: Seated   Long  Arc Quad  2 sets;10 reps    Long Arc Quad Limitations  2# on right, 3# on left    Marching  2 sets;10 reps    Marching Limitations  2# on right, 3# on right    Sit to [employer]  10 reps      Knee/Hip Exercises: Supine   Short Arc [employer]  10 reps    Bridges  2 sets;10 reps    Bridges Limitations  2nd set with yellow band around knees    Straight Leg Raises  2 sets;5 reps   x10 on left   Other Supine Knee/Hip Exercises  Clamshell with green band 2x15             PT Education - 09/24/19 1002    Education Details  HEP    Person(s) Educated  Patient    Methods  Explanation;Demonstration;Verbal cues    Comprehension  Verbalized understanding;Returned demonstration;Verbal cues required;Need further instruction  PT Short Term Goals - 08/30/19 1134      PT SHORT TERM GOAL #1   Title  pt to be I with inital HEP    Period  Weeks    Status  On-going      PT SHORT TERM GOAL #2   Title  assess BERG balance and adjust LTG based on assessment    Period  Weeks    Status  Achieved        PT Long Term Goals - 08/30/19 1134      PT LONG TERM GOAL #1   Title  pt to increase gross hip strengtht o >/= 4/5 to promote safety with walking/ standing    Period  Weeks    Status  On-going      PT LONG TERM GOAL #2   Title  pt to be able to walk/ stand >/=30 min and navigate u/dwon >/= 6 steps with LRAD for functional endurance and safety for in home/ community ambulation    Period  Weeks    Status  On-going      PT LONG TERM GOAL #3   Title  pt to be I with all HEP given as of last visit to maintain and progress current level of function    Period  Weeks    Status  On-going      PT LONG TERM GOAL #4   Title  increase Berg balance to >/= 40/56 to demo improvment in balance and stability    Time  6    Period  Weeks    Status  New    Target Date  10/11/19            Plan - 09/24/19 1009    Clinical Impression Statement  Patient tolerated therapy well with no adverse  effects. She seems to be improving with LE strength but does continue to have difficulty with initiating movement and with her coordination. She stated she would like to get back to riding a bike so she performed the recumbent cycle with good tolerance this visit. She would benefit from continued skilled PT to progress strength and balance to improve walking ability.    PT Treatment/Interventions  ADLs/Self Care Home Management;Cryotherapy;Electrical Stimulation;Iontophoresis 4mg /ml Dexamethasone;Moist Heat;Ultrasound;Gait training;Stair training;Functional mobility training;Therapeutic activities;Therapeutic exercise;Balance training;Neuromuscular re-education;Manual techniques;Passive range of motion;Taping;Dry needling    PT Next Visit Plan  review/ update HEP PRN,  gait training with RW and stair training, review benefits of using walking for safety, hip/ knee ROM and strengthening    PT [place] - seated clamshells, LAQ, marching and heel/ toe raises    Consulted and Agree with Plan of Care  Patient       Patient will benefit from skilled therapeutic intervention in order to improve the following deficits and impairments:  Abnormal gait, Pain, Improper body mechanics, Postural dysfunction, Decreased strength, Difficulty walking, Decreased balance, Decreased activity tolerance, Decreased coordination, Decreased endurance, Decreased range of motion, Decreased mobility  Visit Diagnosis: Other abnormalities of gait and mobility  Muscle weakness (generalized)  Unsteadiness on feet     Problem List Patient Active Problem List   Diagnosis Date Noted  . Cervicalgia 06/11/2019  . Right trigeminal neuralgia 06/11/2019  . Prediabetes 08/20/2013  . Elevated LFTs 08/20/2013  . Fatty liver 08/20/2013  . Vaginitis and vulvovaginitis 07/12/2013  . 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 Lupus (systemic lupus erythematosus) ([place]) 06/24/2013    Hilda Blades, PT, DPT, LAT, ATC 09/24/19   10:51 AM Phone:  [phone] Fax: [place] [facility] [address] [place], [place], [address] Phone: [phone]   Fax:  [phone]  Name: ANIBAL ZINGALE MRN: [id number] Date of Birth: 27-Jan-1966

## 2019-09-26 ENCOUNTER — Other Ambulatory Visit: Payer: Self-pay | Admitting: Neurology

## 2019-09-26 ENCOUNTER — Ambulatory Visit: Payer: Medicare Other | Admitting: Physical Therapy

## 2019-09-27 MED ORDER — CARBAMAZEPINE 200 MG PO TABS: 200.0000 mg | ORAL_TABLET | Freq: Three times a day (TID) | ORAL | 0 refills | Status: DC

## 2019-09-30 ENCOUNTER — Ambulatory Visit: Payer: Medicare Other | Admitting: Physical Therapy

## 2019-10-02 ENCOUNTER — Ambulatory Visit: Payer: Medicare Other | Admitting: Physical Therapy

## 2019-10-09 ENCOUNTER — Telehealth: Payer: Self-pay | Admitting: Physical Therapy

## 2019-10-09 ENCOUNTER — Ambulatory Visit: Payer: Medicare Other | Admitting: Physical Therapy

## 2019-10-09 NOTE — Telephone Encounter (Signed)
LVM regarding missed appointment today. Noted when her next appointment is scheduled and if she is unable to attend to please call and we can cancel or reschedule that appointment for her. Advised to review the attendance policy provided in the yellow folder as needed.    Yvonnie Schinke PT, DPT, LAT, ATC  10/09/19  2:16 PM

## 2019-10-11 ENCOUNTER — Encounter: Payer: Self-pay | Admitting: Physical Therapy

## 2019-10-11 ENCOUNTER — Ambulatory Visit: Payer: Medicare Other | Admitting: Physical Therapy

## 2019-10-11 ENCOUNTER — Other Ambulatory Visit: Payer: Self-pay

## 2019-10-11 DIAGNOSIS — R2681 Unsteadiness on feet: Secondary | ICD-10-CM

## 2019-10-11 DIAGNOSIS — M6281 Muscle weakness (generalized): Secondary | ICD-10-CM

## 2019-10-11 DIAGNOSIS — R2689 Other abnormalities of gait and mobility: Secondary | ICD-10-CM

## 2019-10-11 NOTE — Therapy (Signed)
[place], [place], [address] Phone: [phone]   Fax:  [phone]  Physical Therapy Treatment / Re-certification  Patient Details  Name: SIDONIE ROSANDER MRN: [id number] Date of Birth: 05-08-1966 Referring Provider (PT): Charlott Rakes, MD   Encounter Date: 10/11/2019  PT End of Session - 10/11/19 1149    Visit Number  5    Number of Visits  13    Date for PT Re-Evaluation  11/22/19    Authorization Type  MCR: KX mod at 15th visit, progress note at 10th visit    PT Start Time  1048    PT Stop Time  1130    PT Time Calculation (min)  42 min    Equipment Utilized During Treatment  Gait belt    Activity Tolerance  Patient tolerated treatment well;Patient limited by fatigue    Behavior During Therapy  [facility] for tasks assessed/performed       Past Medical History:  Diagnosis Date  . Allergy   . Asthma   . Lupus ([place])   . Myocardial infarction ([place])   . Rheumatoid arthritis ([place])   . Trigeminal neuralgia     Past Surgical History:  Procedure Laterality Date  . ABDOMINAL HYSTERECTOMY    . CESAREAN SECTION    . NERVE AND TENDON REPAIR Right   . ROTATOR CUFF REPAIR Left     There were no vitals filed for this visit.  Subjective Assessment - 10/11/19 1055    Subjective  "I saw a new RA MD and saw a new neurologist on 10/09/2019 and they increased my gabapentin and told me to stop my topamax. They are going to get a MRI and ordered my old MRI from [place] to compare. I am working on tracking the days I am off to determine when I am most effected"    Patient Stated Goals  improve balance, decrease pain    Currently in Pain?  No/denies    Pain Score  0-No pain    Pain Orientation  Right    Pain Descriptors / Indicators  Aching;Sore    Pain Onset  More than a month ago    Pain Frequency  Intermittent    Aggravating Factors   walking/ standing    Pain Relieving Factors  medication         [facility] PT  Assessment - 10/11/19 0001      Assessment   Medical Diagnosis  Other systemic lupus erythematosus with other organ involvement,  Arthralgia, unspecified joint     Referring Provider (PT)  Charlott Rakes, MD      Balance Screen   Has the patient fallen in the past 6 months  No   but reports 6 near falls since the last session   Has the patient had a decrease in activity level because of a fear of falling?   Yes    Is the patient reluctant to leave their home because of a fear of falling?   Yes      Berg Balance Test   Sit to Stand  Able to stand using hands after several tries    Standing Unsupported  Able to stand safely 2 minutes    Sitting with Back Unsupported but Feet Supported on Floor or Stool  Able to sit 30 seconds    Stand to Sit  Uses backs of legs against chair to control descent    Transfers  Able to transfer with verbal cueing and /or  supervision    Standing Unsupported with Eyes Closed  Needs help to keep from falling    Standing Unsupported with Feet Together  Needs help to attain position but able to stand for 30 seconds with feet together    From Standing, Reach Forward with Outstretched Arm  Loses balance while trying/requires external support    From Standing Position, Pick up Object from Floor  Unable to try/needs assist to keep balance    From Standing Position, Turn to Look Behind Over each Shoulder  Needs supervision when turning    Turn 360 Degrees  Needs assistance while turning    Standing Unsupported, Alternately Place Feet on Step/Stool  Needs assistance to keep from falling or unable to try    Standing Unsupported, One Foot in [place] balance while stepping or standing    Standing on One Leg  Unable to try or needs assist to prevent fall    Total Score  14                   [facility] Adult PT Treatment/Exercise - 10/11/19 0001      Knee/Hip Exercises: Aerobic   Recumbent Bike  L3 x 5 min      Knee/Hip Exercises: Standing   Hip Flexion   Right;Stengthening;1 set;10 reps   using RW for stability, gait bil and min assist for safety     Knee/Hip Exercises: Seated   Other Seated Knee/Hip Exercises  hip extension bil pushing down through heels 2 x 10 with green theraband    Sit to Sand  1 set;10 reps             PT Education - 10/11/19 1149    Education Details  reviewed HEP and reviewed POC    Person(s) Educated  Patient    Methods  Explanation;Verbal cues    Comprehension  Verbalized understanding;Verbal cues required       PT Short Term Goals - 10/11/19 1109      PT SHORT TERM GOAL #1   Title  pt to be I with inital HEP    Baseline  states she does her HEP every day    Period  Weeks    Status  Achieved      PT SHORT TERM GOAL #2   Title  assess BERG balance and adjust LTG based on assessment    Period  Weeks    Status  Achieved        PT Long Term Goals - 10/11/19 1110      PT LONG TERM GOAL #1   Title  pt to increase gross hip strengtht o >/= 4/5 to promote safety with walking/ standing    Baseline  continued weakness in bil hips with R>L    Period  Weeks    Status  On-going      PT LONG TERM GOAL #2   Title  pt to be able to walk/ stand >/=30 min and navigate u/dwon >/= 6 steps with LRAD for functional endurance and safety for in home/ community ambulation    Period  Weeks    Status  On-going      PT LONG TERM GOAL #3   Title  pt to be I with all HEP given as of last visit to maintain and progress current level of function    Period  Weeks    Status  On-going      PT LONG TERM GOAL #4   Title  increase  Berg balance to >/= 40/56 to demo improvment in balance and stability    Baseline  pt notes feeling mor eoff today scored 14/56    Period  Weeks    Status  On-going            Plan - 10/11/19 1145    Clinical Impression Statement  pt reports she has seen a new neurologist and is planning to get an MRI and compare it to her old MRI when this began. pt reports feeling like she is  getting better and is consistent with her HEP. She continues to report no pain but reports continued weakness and motor control R>L. Her score decreased on BERG to a 14/56 but noted she is feeling off more which she attributes to the changes in gabapentin. She would benefit from continued physical therapy to increaed strength/ coordination, promote gait with DME, and maximize strength/stability/ safety by addressing the deficits listed.    PT Frequency  2x / week    PT Duration  6 weeks    PT Treatment/Interventions  ADLs/Self Care Home Management;Cryotherapy;Electrical Stimulation;Iontophoresis 4mg /ml Dexamethasone;Moist Heat;Ultrasound;Gait training;Stair training;Functional mobility training;Therapeutic activities;Therapeutic exercise;Balance training;Neuromuscular re-education;Manual techniques;Passive range of motion;Taping;Dry needling    PT Next Visit Plan  review/ update HEP PRN,  gait training with RW and stair training, review benefits of using walking for safety, hip/ knee ROM and strengthening    PT [place] - seated clamshells, LAQ, marching and heel/ toe raises    Consulted and Agree with Plan of Care  Patient       Patient will benefit from skilled therapeutic intervention in order to improve the following deficits and impairments:  Abnormal gait, Pain, Improper body mechanics, Postural dysfunction, Decreased strength, Difficulty walking, Decreased balance, Decreased activity tolerance, Decreased coordination, Decreased endurance, Decreased range of motion, Decreased mobility  Visit Diagnosis: Other abnormalities of gait and mobility - Plan: PT plan of care cert/re-cert  Muscle weakness (generalized) - Plan: PT plan of care cert/re-cert  Unsteadiness on feet - Plan: PT plan of care cert/re-cert     Problem List Patient Active Problem List   Diagnosis Date Noted  . Cervicalgia 06/11/2019  . Right trigeminal neuralgia 06/11/2019  . Prediabetes 08/20/2013   . Elevated LFTs 08/20/2013  . Fatty liver 08/20/2013  . Vaginitis and vulvovaginitis 07/12/2013  . 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 Lupus (systemic lupus erythematosus) ([place]) 06/24/2013   Starr Lake PT, DPT, LAT, ATC  10/11/19  11:53 AM      [place] [facility] [address] [place], [place], [address] Phone: [phone]   Fax:  [phone]  Name: ERIANNY WAKEHAM MRN: [id number] Date of Birth: 10/28/1965

## 2019-10-17 ENCOUNTER — Other Ambulatory Visit: Payer: Self-pay

## 2019-10-17 ENCOUNTER — Encounter: Payer: Self-pay | Admitting: Physical Therapy

## 2019-10-17 ENCOUNTER — Ambulatory Visit: Payer: Medicare Other | Attending: Family Medicine | Admitting: Physical Therapy

## 2019-10-17 DIAGNOSIS — R2689 Other abnormalities of gait and mobility: Secondary | ICD-10-CM | POA: Diagnosis not present

## 2019-10-17 DIAGNOSIS — R2681 Unsteadiness on feet: Secondary | ICD-10-CM | POA: Diagnosis present

## 2019-10-17 DIAGNOSIS — M6281 Muscle weakness (generalized): Secondary | ICD-10-CM

## 2019-10-17 NOTE — Therapy (Signed)
[place], [place], [address] Phone: [phone]   Fax:  [phone]  Physical Therapy Treatment  Patient Details  Name: Brittney Maddox MRN: [id number] Date of Birth: 02-Jun-1966 Referring Provider (PT): Charlott Rakes, MD   Encounter Date: 10/17/2019  PT End of Session - 10/17/19 1009    Visit Number  6    Number of Visits  13    Date for PT Re-Evaluation  11/22/19    Authorization Type  MCR: KX mod at 15th visit, progress note at 10th visit    PT Start Time  0937    PT Stop Time  1015    PT Time Calculation (min)  38 min       Past Medical History:  Diagnosis Date  . Allergy   . Asthma   . Lupus ([place])   . Myocardial infarction ([place])   . Rheumatoid arthritis ([place])   . Trigeminal neuralgia     Past Surgical History:  Procedure Laterality Date  . ABDOMINAL HYSTERECTOMY    . CESAREAN SECTION    . NERVE AND TENDON REPAIR Right   . ROTATOR CUFF REPAIR Left     There were no vitals filed for this visit.  Subjective Assessment - 10/17/19 0941    Subjective  7/10 right ankle    Currently in Pain?  Yes    Pain Score  7    with walking , throbbs with sitting   Pain Location  Ankle    Pain Orientation  Right    Pain Descriptors / Indicators  Aching;Throbbing    Pain Type  Chronic pain    Aggravating Factors   walking , standing    Pain Relieving Factors  meds                       [facility] Adult PT Treatment/Exercise - 10/17/19 0001      Ambulation/Gait   Stairs  Yes    Stairs Assistance  4: Min guard    Stair Management Technique  One rail Right   also RW for left hand    Number of Stairs  4   x 2 forward and reverse stepping down the stairs   Height of Stairs  6      Knee/Hip Exercises: Seated   Long Arc Quad  1 set    [employer] Limitations  2# on right, 3# on left    Marching  1 set    Marching Limitations  2# on right, 3# on right    Sit to [employer]  1 set;10 reps    cues for hands to rise from table, eccentric control to lowe     Knee/Hip Exercises: Supine   Short Arc [employer]  10 reps    Bridges  2 sets;10 reps    Straight Leg Raises  2 sets;5 reps   x10 on left              PT Short Term Goals - 10/11/19 1109      PT SHORT TERM GOAL #1   Title  pt to be I with inital HEP    Baseline  states she does her HEP every day    Period  Weeks    Status  Achieved      PT SHORT TERM GOAL #2   Title  assess BERG balance and adjust LTG based on assessment    Period  Weeks  Status  Achieved        PT Long Term Goals - 10/11/19 1110      PT LONG TERM GOAL #1   Title  pt to increase gross hip strengtht o >/= 4/5 to promote safety with walking/ standing    Baseline  continued weakness in bil hips with R>L    Period  Weeks    Status  On-going      PT LONG TERM GOAL #2   Title  pt to be able to walk/ stand >/=30 min and navigate u/dwon >/= 6 steps with LRAD for functional endurance and safety for in home/ community ambulation    Period  Weeks    Status  On-going      PT LONG TERM GOAL #3   Title  pt to be I with all HEP given as of last visit to maintain and progress current level of function    Period  Weeks    Status  On-going      PT LONG TERM GOAL #4   Title  increase Berg balance to >/= 40/56 to demo improvment in balance and stability    Baseline  pt notes feeling mor eoff today scored 14/56    Period  Weeks    Status  On-going            Plan - 10/17/19 1008    Clinical Impression Statement  Instructed pt in use of RW and stronger leg to ascend stairs. She currently keeps the RW in the car all of the time and pulls her self up the stairs with UE. She does not have an AD in home and it was encouraged. She would benefot from more stair training with AD.    PT Next Visit Plan  review/ update HEP PRN,  gait training with RW and stair training, review benefits of using walking for safety, hip/ knee ROM and strengthening     PT [place] - seated clamshells, LAQ, marching and heel/ toe raises       Patient will benefit from skilled therapeutic intervention in order to improve the following deficits and impairments:  Abnormal gait, Pain, Improper body mechanics, Postural dysfunction, Decreased strength, Difficulty walking, Decreased balance, Decreased activity tolerance, Decreased coordination, Decreased endurance, Decreased range of motion, Decreased mobility  Visit Diagnosis: Other abnormalities of gait and mobility  Muscle weakness (generalized)  Unsteadiness on feet     Problem List Patient Active Problem List   Diagnosis Date Noted  . Cervicalgia 06/11/2019  . Right trigeminal neuralgia 06/11/2019  . Prediabetes 08/20/2013  . Elevated LFTs 08/20/2013  . Fatty liver 08/20/2013  . Vaginitis and vulvovaginitis 07/12/2013  . 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 Lupus (systemic lupus erythematosus) ([place]) 06/24/2013    Dorene Ar, PTA 10/17/2019, 12:30 PM  [facility] [address] [place], [place], [address] Phone: [phone]   Fax:  [phone]  Name: Brittney Maddox MRN: [id number] Date of Birth: [birth year]

## 2019-10-21 ENCOUNTER — Ambulatory Visit: Payer: Medicare Other | Admitting: Physical Therapy

## 2019-10-23 ENCOUNTER — Other Ambulatory Visit: Payer: Self-pay | Admitting: Neurology

## 2019-10-24 ENCOUNTER — Ambulatory Visit: Payer: Medicare Other | Admitting: Physical Therapy

## 2019-10-29 ENCOUNTER — Other Ambulatory Visit: Payer: Self-pay

## 2019-10-29 ENCOUNTER — Encounter: Payer: Self-pay | Admitting: Physical Therapy

## 2019-10-29 ENCOUNTER — Ambulatory Visit: Payer: Medicare Other | Admitting: Physical Therapy

## 2019-10-29 DIAGNOSIS — M6281 Muscle weakness (generalized): Secondary | ICD-10-CM

## 2019-10-29 DIAGNOSIS — R2681 Unsteadiness on feet: Secondary | ICD-10-CM

## 2019-10-29 DIAGNOSIS — R2689 Other abnormalities of gait and mobility: Secondary | ICD-10-CM

## 2019-10-29 NOTE — Therapy (Signed)
[place], [place], [address] Phone: [phone]   Fax:  [phone]  Physical Therapy Treatment  Patient Details  Name: Brittney Maddox MRN: [id number] Date of Birth: 1965/12/18 Referring Provider (PT): Charlott Rakes, MD   Encounter Date: 10/29/2019  PT End of Session - 10/29/19 1249    Visit Number  7    Number of Visits  13    Date for PT Re-Evaluation  11/22/19    Authorization Type  MCR: KX mod at 15th visit, progress note at 10th visit    PT Start Time  1147    PT Stop Time  1232    PT Time Calculation (min)  45 min    Activity Tolerance  Patient tolerated treatment well;Patient limited by fatigue    Behavior During Therapy  [facility] for tasks assessed/performed       Past Medical History:  Diagnosis Date  . Allergy   . Asthma   . Lupus ([place])   . Myocardial infarction ([place])   . Rheumatoid arthritis ([place])   . Trigeminal neuralgia     Past Surgical History:  Procedure Laterality Date  . ABDOMINAL HYSTERECTOMY    . CESAREAN SECTION    . NERVE AND TENDON REPAIR Right   . ROTATOR CUFF REPAIR Left     There were no vitals filed for this visit.  Subjective Assessment - 10/29/19 1156    Subjective  " I was working on trying the steps at home and I tripped my self up and tangled my ankle up in the RW"    Currently in Pain?  Yes                       [facility] Adult PT Treatment/Exercise - 10/29/19 0001      Therapeutic Activites    Therapeutic Activities  Other Therapeutic Activities    Other Therapeutic Activities  going up/down steps using RW trialing with the RW sidelways with pt demonstrated limited complinace with, then trialed going backward with RW wedge in step at angle, alterning hand placement to back of RW and she demonstrated significant improvement in compliance and safety.       Knee/Hip Exercises: Aerobic   Recumbent Bike  L3 x 5 min      Knee/Hip Exercises: Supine    Short Arc Quad Sets  10 reps    Straight Leg Raises  Strengthening;1 set;10 reps;Both    Other Supine Knee/Hip Exercises  ankle pumps with ankles in elevated position 2 x 10             PT Education - 10/29/19 1249    Education Details  stair training with RW to maximize safety/ mobility. benefits of elevating the ankles to reduce LE edema.    Person(s) Educated  Patient    Methods  Explanation;Verbal cues;Demonstration;Tactile cues    Comprehension  Verbalized understanding;Verbal cues required;Returned demonstration       PT Short Term Goals - 10/11/19 1109      PT SHORT TERM GOAL #1   Title  pt to be I with inital HEP    Baseline  states she does her HEP every day    Period  Weeks    Status  Achieved      PT SHORT TERM GOAL #2   Title  assess BERG balance and adjust LTG based on assessment    Period  Weeks    Status  Achieved  PT Long Term Goals - 10/11/19 1110      PT LONG TERM GOAL #1   Title  pt to increase gross hip strengtht o >/= 4/5 to promote safety with walking/ standing    Baseline  continued weakness in bil hips with R>L    Period  Weeks    Status  On-going      PT LONG TERM GOAL #2   Title  pt to be able to walk/ stand >/=30 min and navigate u/dwon >/= 6 steps with LRAD for functional endurance and safety for in home/ community ambulation    Period  Weeks    Status  On-going      PT LONG TERM GOAL #3   Title  pt to be I with all HEP given as of last visit to maintain and progress current level of function    Period  Weeks    Status  On-going      PT LONG TERM GOAL #4   Title  increase Berg balance to >/= 40/56 to demo improvment in balance and stability    Baseline  pt notes feeling mor eoff today scored 14/56    Period  Weeks    Status  On-going            Plan - 10/29/19 1250    Clinical Impression Statement  pt reports she has been having progressive issues with RLE activation and swelling throughout the RLE. Educated on  proper stair training and benefit of edema reduction techniques. Discussed benefits of transitioning to our neuro location to maximize her benefits as they are more equipped for neuro involvement/ presenation.    Examination-Activity Limitations  Stand;Stairs;Squat;Locomotion Level;Bend;Bathing    PT Treatment/Interventions  ADLs/Self Care Home Management;Cryotherapy;Electrical Stimulation;Iontophoresis 4mg /ml Dexamethasone;Moist Heat;Ultrasound;Gait training;Stair training;Functional mobility training;Therapeutic activities;Therapeutic exercise;Balance training;Neuromuscular re-education;Manual techniques;Passive range of motion;Taping;Dry needling    PT Next Visit Plan  review/ update HEP PRN,  gait training with RW and stair training, review benefits of using walking for safety, hip/ knee ROM and strengthening    PT [place] - seated clamshells, LAQ, marching and heel/ toe raises    Consulted and Agree with Plan of Care  Patient       Patient will benefit from skilled therapeutic intervention in order to improve the following deficits and impairments:  Abnormal gait, Pain, Improper body mechanics, Postural dysfunction, Decreased strength, Difficulty walking, Decreased balance, Decreased activity tolerance, Decreased coordination, Decreased endurance, Decreased range of motion, Decreased mobility  Visit Diagnosis: Other abnormalities of gait and mobility  Muscle weakness (generalized)  Unsteadiness on feet     Problem List Patient Active Problem List   Diagnosis Date Noted  . Cervicalgia 06/11/2019  . Right trigeminal neuralgia 06/11/2019  . Prediabetes 08/20/2013  . Elevated LFTs 08/20/2013  . Fatty liver 08/20/2013  . Vaginitis and vulvovaginitis 07/12/2013  . 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 Lupus (systemic lupus erythematosus) ([place]) 06/24/2013   Starr Lake PT, DPT, LAT, ATC  10/29/19  12:53 PM      [facility] [address] [place], [place], [address] Phone: [phone]   Fax:  [phone]  Name: Brittney Maddox MRN: [id number] Date of Birth: 02-10-1966

## 2019-10-31 ENCOUNTER — Ambulatory Visit: Payer: Medicare Other | Admitting: Physical Therapy

## 2019-11-04 ENCOUNTER — Other Ambulatory Visit: Payer: Self-pay

## 2019-11-04 ENCOUNTER — Ambulatory Visit: Payer: Medicare Other | Admitting: Physical Therapy

## 2019-11-04 DIAGNOSIS — R2689 Other abnormalities of gait and mobility: Secondary | ICD-10-CM

## 2019-11-04 DIAGNOSIS — R2681 Unsteadiness on feet: Secondary | ICD-10-CM

## 2019-11-04 DIAGNOSIS — M6281 Muscle weakness (generalized): Secondary | ICD-10-CM

## 2019-11-04 NOTE — Patient Instructions (Addendum)
Access Code: [email or web address] URL: [email or web address] Date: 11/04/2019 Prepared by: Mady Haagensen  Exercises Seated Heel Toe Raises - 10 reps - 2 sets - 2x daily - 7x weekly Seated March - 10 reps - 2 sets - 1x daily - 7x weekly Seated Hip Abduction - 10 reps - 2 sets - 2x daily - 7x weekly Seated Long Arc Quad - 10 reps - 2 sets - 1x daily - 7x weekly Supine Bridge - 10 reps - 2 sets - 1x daily - 7x weekly Hooklying Clamshell with Resistance - 10 reps - 2 sets - 1x daily - 7x weekly  Added 11/04/2019 Sit to stand in stride stance - 10 reps - 1 sets - 1-2x daily - 5x weekly Seated Gluteal Sets - 10 reps - 2 sets - 3 sec hold - 1-2x daily - 7x weekly

## 2019-11-04 NOTE — Therapy (Signed)
[facility] [address] [place], [place], [address] Phone: [phone]   Fax:  [phone]  Physical Therapy Treatment  Patient Details  Name: Brittney Maddox MRN: [id number] Date of Birth: August 26, 1966 Referring Provider (PT): Charlott Rakes, MD   Encounter Date: 11/04/2019  PT End of Session - 11/04/19 1208    Visit Number  8    Number of Visits  13    Date for PT Re-Evaluation  11/22/19    Authorization Type  MCR: KX mod at 15th visit, progress note at 10th visit    PT Start Time  0940   Pt arrived late   PT Stop Time  1016    PT Time Calculation (min)  36 min    Activity Tolerance  Patient tolerated treatment well;Patient limited by fatigue    Behavior During Therapy  [facility] for tasks assessed/performed       Past Medical History:  Diagnosis Date  . Allergy   . Asthma   . Lupus ([place])   . Myocardial infarction ([place])   . Rheumatoid arthritis ([place])   . Trigeminal neuralgia     Past Surgical History:  Procedure Laterality Date  . ABDOMINAL HYSTERECTOMY    . CESAREAN SECTION    . NERVE AND TENDON REPAIR Right   . ROTATOR CUFF REPAIR Left     There were no vitals filed for this visit.  Subjective Assessment - 11/04/19 0942    Subjective  Mostly want to work on strength of my legs; especially, as R seems weaker than L.  No other falls since the one on the steps.  Will be moving into a house with mother, no steps.    Patient Stated Goals  improve balance, decrease pain    Currently in Pain?  Yes    Pain Score  8     Pain Location  Face    Pain Orientation  Right    Pain Descriptors / Indicators  Aching;Throbbing    Pain Type  Chronic pain    Pain Onset  More than a month ago    Pain Frequency  Intermittent    Aggravating Factors   worse when she doesn't take her medications    Pain Relieving Factors  medications                       [facility] Adult PT Treatment/Exercise - 11/04/19 0954       Transfers   Transfers  Sit to Stand;Stand to Sit    Sit to Stand  4: Min guard;With upper extremity assist;From bed    Stand to Sit  6: Modified independent (Device/Increase time);With upper extremity assist;To bed    Number of Reps  2 sets;Other reps (comment)   5 reps, 2nd set stride stance, RLE tucked posteriorly     Ambulation/Gait   Ambulation/Gait  Yes    Ambulation/Gait Assistance  5: Supervision;4: Min guard    Ambulation Distance (Feet)  60 Feet   x 2, 40 ft x 2   Assistive device  Rolling walker    Gait Pattern  Step-to pattern;Decreased stride length;Trendelenburg;Antalgic;Trunk flexed;Shuffle;Narrow base of support;Decreased hip/knee flexion - right;Decreased dorsiflexion - right      High Level Balance   High Level Balance Comments  Standing at counter (pt initially leans forward onto sink), with cues to stand upright, alternating UE lifts x 8 reps to cabinet      Self-Care   Self-Care  Other Self-Care  Comments    Other Self-Care Comments   Discussed pt's goals, as pt has transferred to this clinic from [facility], and this is her first visit.  Goals continue to be strength and balance.  Discussed POC and scheduling (pt wants to schedule 1x/wk for remaining 3 weeks of POC due to her upcoming move).        Knee/Hip Exercises: Standing   Other Standing Knee Exercises  Standing hip abduction step taps x 5 reps LLE, RLE x 5 reps with difficulty.  Attempted step taps to 4" cabinet step, LLE x 5 reps, RLE unable.      Knee/Hip Exercises: Seated   Other Seated Knee/Hip Exercises  Seated hip abduction LLE x 5 reps, RLE x 5 reps (unable to perform without use of towel for towel slide out and in)    Other Seated Knee/Hip Exercises  Seated glut set 5 reps, 3 sec hold             PT Education - 11/04/19 1207    Education Details  Updates/additions to HEP-see instructions    Person(s) Educated  Patient    Methods  Explanation;Handout    Comprehension  Verbalized  understanding;Returned demonstration       PT Short Term Goals - 10/11/19 1109      PT SHORT TERM GOAL #1   Title  pt to be I with inital HEP    Baseline  states she does her HEP every day    Period  Weeks    Status  Achieved      PT SHORT TERM GOAL #2   Title  assess BERG balance and adjust LTG based on assessment    Period  Weeks    Status  Achieved        PT Long Term Goals - 11/04/19 1216      PT LONG TERM GOAL #1   Title  pt to increase gross hip strengtht o >/= 4/5 to promote safety with walking/ standing  (TARGET FOR ALL LTGS, per 6 week POC Recert [id number]:  [id number])    Baseline  continued weakness in bil hips with R>L    Period  Weeks    Status  On-going      PT LONG TERM GOAL #2   Title  pt to be able to walk/ stand >/=30 min and navigate u/dwon >/= 6 steps with LRAD for functional endurance and safety for in home/ community ambulation    Period  Weeks    Status  On-going      PT LONG TERM GOAL #3   Title  pt to be I with all HEP given as of last visit to maintain and progress current level of function    Period  Weeks    Status  On-going      PT LONG TERM GOAL #4   Title  increase Berg balance to >/= 40/56 to demo improvment in balance and stability    Baseline  pt notes feeling mor eoff today scored 14/56    Period  Weeks    Status  On-going            Plan - 11/04/19 1209    Clinical Impression Statement  Pt presents to O[facility]T at [facility], transferred from [place].  She continues to demonstrate decreased RLE strength, as well as decreased timing and coordination with gait, decreased balance.  With attempts at seated and standing exercises, pt has difficulty with RLE initiation against  gravity; also pt requires cues with standing and gait for more upright posture.  She will continue to benefit from skilled PT to work towards improved functional mobility, strength and balance.    Examination-Activity Limitations   Stand;Stairs;Squat;Locomotion Level;Bend;Bathing    PT Frequency  2x / week    PT Duration  6 weeks   (Per recert [id number])   PT Treatment/Interventions  ADLs/Self Care Home Management;Cryotherapy;Electrical Stimulation;Iontophoresis 4mg /ml Dexamethasone;Moist Heat;Ultrasound;Gait training;Stair training;Functional mobility training;Therapeutic activities;Therapeutic exercise;Balance training;Neuromuscular re-education;Manual techniques;Passive range of motion;Taping;Dry needling    PT Next Visit Plan  Pt to ask MD for order for AFO (per pt's discussion at first meeting 11/04/2019)-check on this and trial AFO/foot up in clinic; review updates to HEP; continue to work on lower extremity/core strengthening, balance and gait training    PT [place] - seated clamshells, LAQ, marching and heel/ toe raises    Consulted and Agree with Plan of Care  Patient       Patient will benefit from skilled therapeutic intervention in order to improve the following deficits and impairments:  Abnormal gait, Pain, Improper body mechanics, Postural dysfunction, Decreased strength, Difficulty walking, Decreased balance, Decreased activity tolerance, Decreased coordination, Decreased endurance, Decreased range of motion, Decreased mobility  Visit Diagnosis: Muscle weakness (generalized)  Unsteadiness on feet  Other abnormalities of gait and mobility     Problem List Patient Active Problem List   Diagnosis Date Noted  . Cervicalgia 06/11/2019  . Right trigeminal neuralgia 06/11/2019  . Prediabetes 08/20/2013  . Elevated LFTs 08/20/2013  . Fatty liver 08/20/2013  . Vaginitis and vulvovaginitis 07/12/2013  . 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 Lupus (systemic lupus erythematosus) ([place]) 06/24/2013    Ranvir Renovato W. 11/04/2019, 12:19 PM Frazier Butt., PT  [facility] [address] [place], [place], [address] Phone: [phone]    Fax:  [phone]  Name: MIETTE BOLTON MRN: [id number] Date of Birth: 05/11/1966

## 2019-11-06 ENCOUNTER — Ambulatory Visit: Payer: Medicare Other | Admitting: Physical Therapy

## 2019-11-07 ENCOUNTER — Ambulatory Visit
Admission: EM | Admit: 2019-11-07 | Discharge: 2019-11-07 | Disposition: A | Discharge status: Home / Self Care | Payer: Medicare Other | Attending: Physician Assistant | Admitting: Physician Assistant

## 2019-11-07 DIAGNOSIS — M25562 Pain in left knee: Secondary | ICD-10-CM | POA: Diagnosis not present

## 2019-11-07 HISTORY — DX: Acute embolism and thrombosis of unspecified deep veins of unspecified lower extremity: I82.409

## 2019-11-07 MED ORDER — MELOXICAM 7.5 MG PO TABS: 7.5000 mg | ORAL_TABLET | Freq: Every day | ORAL | 0 refills | Status: DC

## 2019-11-07 NOTE — ED Triage Notes (Signed)
Pt c/o lt knee pain since this am. Denies injury. States had a blood clot to same area last year and this feels the same.

## 2019-11-07 NOTE — ED Provider Notes (Signed)
[name]    CSN: [id number] Arrival date & time: 11/07/19  1547      History   Chief Complaint Chief Complaint  Patient presents with  . Knee Pain    HPI Brittney Maddox is a 54 y.o. female.   54 year old female with history of SLE, RA, trigeminal neuralgia comes in for left knee pain that started this morning. Denies injury/trauma. 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 Had intraarticular corticosteroid injection to the right knee by rheumatology [id number] without complication. Recently started cam walker to the right foot due to swelling and pain. Denies chronic left knee pain, though xray 06/2019 showed arthritis. Denies pain at rest, pain with ROM and weightbearing. Denies swelling, erythema, warmth. Denies fever. Denies numbness/tingling. Has not taken anything for the pain since symptom onset.  Patient had SLE flare 1-2 months ago. States on imuran, Plaquenil, prednisone 5 mg with good control of lipids.  However, due to right leg/knee pain, Imuran was increased for 3 days ago.  Patient states few years ago back in [place], had similar pain to the left knee, and had x-ray done showing DVT.  States after, did also have ultrasound to confirm.  However, blood thinner was started, and no work-up was done.  No notes available for review.  Patient denies recent long travels, hormone use, chest pain, shortness of breath.     Past Medical History:  Diagnosis Date  . Allergy   . Asthma   . DVT (deep venous thrombosis) ([place])   . Lupus ([place])   . Myocardial infarction ([place])   . Rheumatoid arthritis ([place])   . Trigeminal neuralgia     Patient Active Problem List   Diagnosis Date Noted  . Cervicalgia 06/11/2019  . Right trigeminal neuralgia 06/11/2019  . Prediabetes 08/20/2013  . Elevated LFTs 08/20/2013  . Fatty liver 08/20/2013  . Vaginitis and vulvovaginitis 07/12/2013  . 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 Lupus (systemic lupus  erythematosus) ([place]) 06/24/2013    Past Surgical History:  Procedure Laterality Date  . ABDOMINAL HYSTERECTOMY    . CESAREAN SECTION    . NERVE AND TENDON REPAIR Right   . ROTATOR CUFF REPAIR Left     OB History   No obstetric history on file.      Home Medications    Prior to Admission medications   Medication Sig Start Date End Date Taking? Authorizing Provider  azaTHIOprine (IMURAN) 50 MG tablet Take 0.5 tablets (25 mg total) by mouth daily. 09/10/19   Bo Merino, MD  butalbital-acetaminophen-caffeine (FIORICET) 50-325-40 MG tablet TAKE 1-2 TABLETS BY MOUTH EVERY 12 (TWELVE) HOURS AS NEEDED FOR HEADACHE. 08/23/19 08/22/20  Charlott Rakes, MD  carbamazepine (TEGRETOL) 200 MG tablet TAKE 1 TABLET BY MOUTH 3 TIMES DAILY 10/23/19   Dohmeier, Asencion Partridge, MD  diclofenac Sodium (VOLTAREN) 1 % GEL APPLY 4 G TOPICALLY 4 (FOUR) TIMES DAILY. 09/23/19   Charlott Rakes, MD  gabapentin (NEURONTIN) 300 MG capsule Take 2 capsules (600 mg total) by mouth at bedtime. 07/04/19   Charlott Rakes, MD  hydroxychloroquine (PLAQUENIL) 200 MG tablet TAKE 1 TABLET BY MOUTH TWICE A DAY 09/04/19   Bo Merino, MD  meloxicam (MOBIC) 7.5 MG tablet Take 1 tablet (7.5 mg total) by mouth daily. 11/07/19   Tasia Catchings, Meleny Tregoning V, PA-C  predniSONE (DELTASONE) 10 MG tablet Take 40 mg by mouth daily. 07/25/19   [provider]  tiZANidine (ZANAFLEX) 4 MG tablet Take 1 tablet (4 mg total)  by mouth every 8 (eight) hours as needed for muscle spasms. 07/04/19   Charlott Rakes, MD  topiramate (TOPAMAX) 50 MG tablet Take 1 tablet (50 mg total) by mouth 2 (two) times daily. 08/15/19   Charlott Rakes, MD    Family History Family History  Problem Relation Age of Onset  . Heart disease Mother   . Osteoporosis Mother   . Prostate cancer Father   . Aneurysm Father   . Diabetes Father   . Migraines Father   . Healthy Daughter   . Healthy Daughter   . Healthy Daughter   . Healthy Daughter   . Healthy Son   .  Diabetes Brother     Social History Social History   Tobacco Use  . Smoking status: Never Smoker  . Smokeless tobacco: Never Used  Substance Use Topics  . Alcohol use: No  . Drug use: No     Allergies   Patient has no known allergies.   Review of Systems Review of Systems  Reason unable to perform ROS: See HPI as above.     Physical Exam Triage Vital Signs ED Triage Vitals  Enc Vitals Group     BP 11/07/19 1600 (!) 138/95     Pulse Rate 11/07/19 1600 69     Resp 11/07/19 1600 (!) 69     Temp 11/07/19 1600 98.1 F (36.7 C)     Temp Source 11/07/19 1600 Oral     SpO2 11/07/19 1600 98 %     Weight --      Height --      Head Circumference --      Peak Flow --      Pain Score 11/07/19 1601 9     Pain Loc --      Pain Edu? --      Excl. in [place]? --    No data found.  Updated Vital Signs BP (!) 138/95 (BP Location: Left Arm)   Pulse 69   Temp 98.1 F (36.7 C) (Oral)   Resp (!) 69   SpO2 98%   Physical Exam Constitutional:      General: She is not in acute distress.    Appearance: Normal appearance. She is well-developed. She is not toxic-appearing or diaphoretic.  HENT:     Head: Normocephalic and atraumatic.  Eyes:     Conjunctiva/sclera: Conjunctivae normal.     Pupils: Pupils are equal, round, and reactive to light.  Pulmonary:     Effort: Pulmonary effort is normal. No respiratory distress.     Comments: Speaking in full sentences without difficulty Musculoskeletal:     Cervical back: Normal range of motion and neck supple.     Comments: No swelling, erythema, warmth, contusion. Tenderness to palpation along lateral joint line. No tenderness to palpation of hip, thigh, posterior knee, calf. Full ROM of knee. Strength 3/5 bilaterally. Sensation intact. pedal pulse 2+. Negative Homans.   Skin:    General: Skin is warm and dry.  Neurological:     Mental Status: She is alert and oriented to person, place, and time.      UC Treatments / Results   Labs (all labs ordered are listed, but only abnormal results are displayed) Labs Reviewed - No data to display  EKG   Radiology No results found.  Procedures Procedures (including critical care time)  Medications Ordered in UC Medications - No data to display  Initial Impression / Assessment and Plan / UC Course  I have  reviewed the triage vital signs and the nursing notes.  Pertinent labs & imaging results that were available during my care of the patient were reviewed by me and considered in my medical decision making (see chart for details).    Discussed low suspicion for DVT at this time given pain to lateral joint line without reproducible pain to the posterior knee. No swelling, erythema, warmth. No cords felt. Known arthritis to the knee. Will start NSAIDs, ice compress, knee sleeve at this time. Return precautions given, specifically discussed signs/symptoms that will warrant further DVT workup. Otherwise follow up with PCP/rheumotologist for further evaluation and management needed. Patient expresses understanding and agrees to plan.  Final Clinical Impressions(s) / UC Diagnoses   Final diagnoses:  Acute pain of left knee   ED Prescriptions    Medication Sig Dispense Auth. Provider   meloxicam (MOBIC) 7.5 MG tablet Take 1 tablet (7.5 mg total) by mouth daily. 10 tablet Ok Edwards, PA-C     I have reviewed the PDMP during this encounter.   Ok Edwards, PA-C 11/07/19 1640

## 2019-11-07 NOTE — Discharge Instructions (Signed)
Right now, low suspicion for blood clot as not swelling. Keep monitoring. Start Mobic. Do not take ibuprofen (motrin/advil)/ naproxen (aleve) while on mobic. Ice compress, rest, knee sleeve during activity. Follow up with PCP/rheumatology if symptoms not improving. If noticing swelling to the knee, redness, warmth, go to the [facility] department for further evaluation

## 2019-11-08 ENCOUNTER — Ambulatory Visit: Payer: Medicare Other | Admitting: Physical Therapy

## 2019-11-09 ENCOUNTER — Other Ambulatory Visit: Payer: Self-pay | Admitting: Family Medicine

## 2019-11-09 DIAGNOSIS — G44091 Other trigeminal autonomic cephalgias (TAC), intractable: Secondary | ICD-10-CM

## 2019-11-15 ENCOUNTER — Other Ambulatory Visit: Payer: Self-pay

## 2019-11-15 ENCOUNTER — Telehealth: Payer: Self-pay | Admitting: Rehabilitation

## 2019-11-15 ENCOUNTER — Ambulatory Visit: Payer: Medicare Other | Attending: Family Medicine | Admitting: Rehabilitation

## 2019-11-15 ENCOUNTER — Encounter: Payer: Self-pay | Admitting: Rehabilitation

## 2019-11-15 DIAGNOSIS — M21371 Foot drop, right foot: Secondary | ICD-10-CM | POA: Insufficient documentation

## 2019-11-15 DIAGNOSIS — R2689 Other abnormalities of gait and mobility: Secondary | ICD-10-CM | POA: Diagnosis present

## 2019-11-15 DIAGNOSIS — R2681 Unsteadiness on feet: Secondary | ICD-10-CM | POA: Insufficient documentation

## 2019-11-15 DIAGNOSIS — M6281 Muscle weakness (generalized): Secondary | ICD-10-CM | POA: Insufficient documentation

## 2019-11-15 NOTE — Patient Instructions (Signed)
Access Code: [email or web address] URL: [email or web address] Date: 11/15/2019 Prepared by: Cameron Sprang  Exercises Seated Heel Toe Raises - 10 reps - 2 sets - 2x daily - 7x weekly Seated March - 10 reps - 2 sets - 1x daily - 7x weekly Seated Hip Abduction - 10 reps - 2 sets - 2x daily - 7x weekly Supine Bridge - 10 reps - 2 sets - 1x daily - 7x weekly Hooklying Clamshell with Resistance - 10 reps - 2 sets - 1x daily - 7x weekly Sit to stand in stride stance - 10 reps - 1 sets - 1-2x daily - 5x weekly Seated Gluteal Sets - 10 reps - 2 sets - 3 sec hold - 1-2x daily - 7x weekly Supine Short Arc Quad - 10 reps - 1 sets - 1x daily - 7x weekly

## 2019-11-15 NOTE — Therapy (Signed)
[facility] [address] [place], [place], [address] Phone: [phone]   Fax:  [phone]  Physical Therapy Treatment  Patient Details  Name: Brittney Maddox MRN: [id number] Date of Birth: March 26, 1966 Referring Provider (PT): Charlott Rakes, MD   Encounter Date: 11/15/2019  PT End of Session - 11/15/19 1354    Visit Number  9    Number of Visits  13    Date for PT Re-Evaluation  11/22/19    Authorization Type  MCR: KX mod at 15th visit, progress note at 10th visit    PT Start Time  1016    PT Stop Time  1100    PT Time Calculation (min)  44 min    Activity Tolerance  Patient tolerated treatment well;Patient limited by fatigue    Behavior During Therapy  [facility] for tasks assessed/performed       Past Medical History:  Diagnosis Date  . Allergy   . Asthma   . DVT (deep venous thrombosis) ([place])   . Lupus ([place])   . Myocardial infarction ([place])   . Rheumatoid arthritis ([place])   . Trigeminal neuralgia     Past Surgical History:  Procedure Laterality Date  . ABDOMINAL HYSTERECTOMY    . CESAREAN SECTION    . NERVE AND TENDON REPAIR Right   . ROTATOR CUFF REPAIR Left     There were no vitals filed for this visit.  Subjective Assessment - 11/15/19 1343    Subjective  Pt reports getting knee brace from ED/urgent care and that it helps. She reports needing some other support for her ankle as she has CAM boot now that she wears all the time except for therapy so that we can do exercises.    Limitations  Standing;Lifting;Walking    How long can you sit comfortably?  10 min    How long can you stand comfortably?  2-3 min    How long can you walk comfortably?  2-3 min    Diagnostic tests  07/01/2019 Impression: These findings are consistent with all osteoarthritis and mild chondromalacia patella.    Patient Stated Goals  improve balance, decrease pain    Currently in Pain?  Yes    Pain Score  7     Pain Location  Knee     Pain Orientation  Right    Pain Descriptors / Indicators  Aching;Throbbing    Pain Onset  More than a month ago    Pain Frequency  Intermittent    Aggravating Factors   worse when not taking meds    Pain Relieving Factors  medications, brace                       [facility] Adult PT Treatment/Exercise - 11/15/19 1020      Transfers   Transfers  Sit to Stand;Stand to Sit    Sit to Stand  4: Min guard    Stand to Sit  4: Min guard    Comments  During session, seemed very unsteady, esp when turning, ,therefore provided min/guard for safety.       Ambulation/Gait   Ambulation/Gait  Yes    Ambulation/Gait Assistance  4: Min guard;4: Min assist;5: Supervision    Ambulation/Gait Assistance Details  Pt at S level ambulating into clinic with RW and R knee brace.  Cues for upright posture and improved extension in RLE during stance.  PT notes that it was addressed at last session about  her getting an AFO for RLE, however pt could not recall this conversation but is open to seeing bracing options.  Pt unwilling to purchase foot up, therefore did not trial and PT then tried PLS ottobock AFO with marked improvement in R foot clearance.  PT reports that she will request AFO order from MD.  Pt verbalized understanding.  Note that she required varying levels of assist during gait as she initially demo's R knee buckle and poor clearance, requiring min A at times to steady.      Ambulation Distance (Feet)  115 Feet   then 71' with AFO   Assistive device  Rolling walker   knee brace and AFO during 2nd bout   Gait Pattern  Step-to pattern;Decreased stride length;Trendelenburg;Antalgic;Trunk flexed;Shuffle;Narrow base of support;Decreased hip/knee flexion - right;Decreased dorsiflexion - right    Ambulation Surface  Level;Indoor      Self-Care   Self-Care  Other Self-Care Comments    Other Self-Care Comments   Discussed bracing options as mentioned in gait section.  Note that she was wanting  another support for ankle, therefore showed and provided handout on ASO from clinic that she may want to wear at night if needed but that she would have to purchase as it is not covered by insurance.  Pt reports dizziness and light headedness when lying flat during session (states this is normal).  PT questioned when neurology appt is.  Pt reports end of March.  PT educated on vascular concerns with her lying back getting light headed and dizzy vs some sort of vertigo.  Recommended she discuss all of this with MD.  She also reports she is getting nerve block for trigeminal neuralgia at this appt also.        Exercises   Exercises  Other Exercises    Other Exercises   Reviewed HEP as pt requires cues for technique.  Note marked pain with LAQ in low back, therefore perfomred in supine with pillow under thigh.  Adjusted this on HEP.           Access Code: [email or web address] URL: [email or web address] Date: 11/15/2019 Prepared by: Cameron Sprang  Exercises Seated Heel Toe Raises - 10 reps - 2 sets - 2x daily - 7x weekly Seated March - 10 reps - 2 sets - 1x daily - 7x weekly Seated Hip Abduction - 10 reps - 2 sets - 2x daily - 7x weekly Supine Bridge - 10 reps - 2 sets - 1x daily - 7x weekly Hooklying Clamshell with Resistance - 10 reps - 2 sets - 1x daily - 7x weekly Sit to stand in stride stance - 10 reps - 1 sets - 1-2x daily - 5x weekly Seated Gluteal Sets - 10 reps - 2 sets - 3 sec hold - 1-2x daily - 7x weekly Supine Short Arc Quad - 10 reps - 1 sets - 1x daily - 7x weekly     PT Education - 11/15/19 1354    Education Details  HEP exercise, see self care    Person(s) Educated  Patient    Methods  Explanation;Demonstration;Handout    Comprehension  Verbalized understanding;Returned demonstration       PT Short Term Goals - 10/11/19 1109      PT SHORT TERM GOAL #1   Title  pt to be I with inital HEP    Baseline  states she does her HEP every day    Period  Weeks    Status   Achieved  PT SHORT TERM GOAL #2   Title  assess BERG balance and adjust LTG based on assessment    Period  Weeks    Status  Achieved        PT Long Term Goals - 11/04/19 1216      PT LONG TERM GOAL #1   Title  pt to increase gross hip strengtht o >/= 4/5 to promote safety with walking/ standing  (TARGET FOR ALL LTGS, per 6 week POC Recert [id number]:  [id number])    Baseline  continued weakness in bil hips with R>L    Period  Weeks    Status  On-going      PT LONG TERM GOAL #2   Title  pt to be able to walk/ stand >/=30 min and navigate u/dwon >/= 6 steps with LRAD for functional endurance and safety for in home/ community ambulation    Period  Weeks    Status  On-going      PT LONG TERM GOAL #3   Title  pt to be I with all HEP given as of last visit to maintain and progress current level of function    Period  Weeks    Status  On-going      PT LONG TERM GOAL #4   Title  increase Berg balance to >/= 40/56 to demo improvment in balance and stability    Baseline  pt notes feeling mor eoff today scored 14/56    Period  Weeks    Status  On-going            Plan - 11/15/19 1355    Clinical Impression Statement  Session focused on addressing R foot drag during gait with trialing PLS ottobock AFO.  This seemed to provide enough support for improved foot clearance but also stabilze her ankle which has been very painful.  Also reviewed HEP as she reports doing "sometimes."  Continues to demonstrate marked weakness in RLE.    Examination-Activity Limitations  Stand;Stairs;Squat;Locomotion Level;Bend;Bathing    PT Frequency  2x / week    PT Duration  6 weeks   (Per recert [id number])   PT Treatment/Interventions  ADLs/Self Care Home Management;Cryotherapy;Electrical Stimulation;Iontophoresis 4mg /ml Dexamethasone;Moist Heat;Ultrasound;Gait training;Stair training;Functional mobility training;Therapeutic activities;Therapeutic exercise;Balance training;Neuromuscular  re-education;Manual techniques;Passive range of motion;Taping;Dry needling    PT Next Visit Plan  POC ends 3/12, Pt did not ask for AFO order, therefore PT to request order.  Continue gait /exercise with PLS AFO,  review updates to HEP; continue to work on lower extremity/core strengthening, balance and gait training    PT [place] - seated clamshells, LAQ, marching and heel/ toe raises    Consulted and Agree with Plan of Care  Patient       Patient will benefit from skilled therapeutic intervention in order to improve the following deficits and impairments:  Abnormal gait, Pain, Improper body mechanics, Postural dysfunction, Decreased strength, Difficulty walking, Decreased balance, Decreased activity tolerance, Decreased coordination, Decreased endurance, Decreased range of motion, Decreased mobility  Visit Diagnosis: Muscle weakness (generalized)  Unsteadiness on feet  Other abnormalities of gait and mobility     Problem List Patient Active Problem List   Diagnosis Date Noted  . Cervicalgia 06/11/2019  . Right trigeminal neuralgia 06/11/2019  . Prediabetes 08/20/2013  . Elevated LFTs 08/20/2013  . Fatty liver 08/20/2013  . Vaginitis and vulvovaginitis 07/12/2013  . 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 Lupus (systemic lupus erythematosus) ([place]) 06/24/2013    Cameron Sprang, PT, MPT [facility] [address] [place], [place], [address] Phone: [phone]   Fax:  [phone] 11/15/19, 1:59 PM  Name: AADYA VANESS MRN: [id number] Date of Birth: [birth year]

## 2019-11-15 NOTE — Telephone Encounter (Signed)
Dr. Margarita Rana,   I am seeing Ms. Brittney Maddox here at [facility] for PT and note that she has significant R foot drag causing instability and LOB.  We trialed use of PLS AFO (ottobock) in clinic with good success in foot clearance indicating improved balance and gait.  Please write order in Epic work que for R AFO and we can get her set up with [employer].   Thanks,  Cameron Sprang, PT, MPT [facility] [address] [place], [place], [address] Phone: [phone]   Fax:  [phone] 11/15/19, 2:53 PM

## 2019-11-16 ENCOUNTER — Other Ambulatory Visit: Payer: Self-pay | Admitting: Neurology

## 2019-11-18 ENCOUNTER — Ambulatory Visit: Payer: Medicare Other | Admitting: Internal Medicine

## 2019-11-18 ENCOUNTER — Encounter: Payer: Self-pay | Admitting: Internal Medicine

## 2019-11-18 ENCOUNTER — Ambulatory Visit (INDEPENDENT_AMBULATORY_CARE_PROVIDER_SITE_OTHER): Payer: Medicare Other | Admitting: Internal Medicine

## 2019-11-18 ENCOUNTER — Encounter: Payer: Self-pay | Admitting: Gastroenterology

## 2019-11-18 DIAGNOSIS — Z1211 Encounter for screening for malignant neoplasm of colon: Secondary | ICD-10-CM | POA: Diagnosis not present

## 2019-11-18 DIAGNOSIS — R269 Unspecified abnormalities of gait and mobility: Secondary | ICD-10-CM

## 2019-11-18 DIAGNOSIS — Z1231 Encounter for screening mammogram for malignant neoplasm of breast: Secondary | ICD-10-CM | POA: Diagnosis not present

## 2019-11-18 MED ORDER — GABAPENTIN 300 MG PO CAPS: 300.0000 mg | ORAL_CAPSULE | Freq: Three times a day (TID) | ORAL | 1 refills | Status: DC

## 2019-11-18 MED ORDER — MISC. DEVICES MISC: 0 refills | Status: DC

## 2019-11-18 NOTE — Telephone Encounter (Signed)
Order has been placed in epic.

## 2019-11-18 NOTE — Progress Notes (Signed)
Virtual Visit via Telephone Note  I connected with CATHE KOLLMANN, on 11/18/2019 at 10:12 AM by telephone due to the COVID-19 pandemic and verified that I am speaking with the correct person using two identifiers.   Consent: I discussed the limitations, risks, security and privacy concerns of performing an evaluation and management service by telephone and the availability of in person appointments. I also discussed with the patient that there may be a patient responsible charge related to this service. The patient expressed understanding and agreed to proceed.   Location of Patient: Home   Location of Provider: Clinic    Persons participating in Telemedicine visit: Mozell M Hallel Melland [facility] Dr. Juleen China      History of Present Illness: Patient has a visit to address health maintenance topics. Needs an order for mammogram and GI referral for colonoscopy. No breast symptoms. No blood in stool or hematochezia.    Past Medical History:  Diagnosis Date  . Allergy   . Asthma   . DVT (deep venous thrombosis) ([place])   . Lupus ([place])   . Myocardial infarction ([place])   . Rheumatoid arthritis ([place])   . Trigeminal neuralgia    No Known Allergies  Current Outpatient Medications on File Prior to Visit  Medication Sig Dispense Refill  . azaTHIOprine (IMURAN) 50 MG tablet Take 1 tablet by mouth daily.    . Cholecalciferol 125 MCG (5000 UT) capsule Take 1 capsule by mouth daily.    Marland Kitchen Dextran 70-Hypromellose 0.1-0.3 % SOLN Apply 2 drops to eye 3 (three) times daily as needed.    . gabapentin (NEURONTIN) 300 MG capsule Take 1 capsule by mouth 3 (three) times daily.    . hydroxychloroquine (PLAQUENIL) 200 MG tablet Take 1 tablet (200 mg total) by mouth 2 times daily. Do not take on saturdays and sundays    . butalbital-acetaminophen-caffeine (FIORICET) 50-325-40 MG tablet TAKE 1-2 TABLETS BY MOUTH EVERY 12 (TWELVE) HOURS AS NEEDED FOR HEADACHE. 30 tablet 0  . carbamazepine (TEGRETOL) 200 MG  tablet TAKE 1 TABLET BY MOUTH 3 TIMES DAILY 90 tablet 0  . diclofenac Sodium (VOLTAREN) 1 % GEL APPLY 4 G TOPICALLY 4 (FOUR) TIMES DAILY. 100 g 2  . meloxicam (MOBIC) 7.5 MG tablet Take 1 tablet (7.5 mg total) by mouth daily. 10 tablet 0  . predniSONE (DELTASONE) 5 MG tablet Take 5 mg by mouth daily.    Marland Kitchen tiZANidine (ZANAFLEX) 4 MG tablet Take 1 tablet (4 mg total) by mouth every 8 (eight) hours as needed for muscle spasms. 90 tablet 3  . topiramate (TOPAMAX) 50 MG tablet TAKE 1 TABLET BY MOUTH TWICE A DAY 60 tablet 0   No current facility-administered medications on file prior to visit.    Observations/Objective: NAD. Speaking clearly.  Work of breathing normal.  Alert and oriented. Mood appropriate.   Assessment and Plan: 1. Colon cancer screening - Ambulatory referral to Gastroenterology  2. Breast cancer screening by mammogram - MM Digital Screening; Future  3. Abnormality of gait Ankle foot orthosis order placed per PT request.  - PT orthosis to lower extremity; Future   Follow Up Instructions: Follow up in 3 months for chronic medical conditions    I discussed the assessment and treatment plan with the patient. The patient was provided an opportunity to ask questions and all were answered. The patient agreed with the plan and demonstrated an understanding of the instructions.   The patient was advised to call back or seek an in-person evaluation  if the symptoms worsen or if the condition fails to improve as anticipated.     I provided 12 minutes total of non-face-to-face time during this encounter including median intraservice time, reviewing previous notes, investigations, ordering medications, medical decision making, coordinating care and patient verbalized understanding at the end of the visit.    Phill Myron, D.O. [facility]  11/18/2019, 10:12 AM

## 2019-11-20 ENCOUNTER — Other Ambulatory Visit: Payer: Self-pay

## 2019-11-20 ENCOUNTER — Ambulatory Visit: Payer: Medicare Other | Admitting: Physical Therapy

## 2019-11-20 DIAGNOSIS — M6281 Muscle weakness (generalized): Secondary | ICD-10-CM | POA: Diagnosis not present

## 2019-11-20 DIAGNOSIS — R2689 Other abnormalities of gait and mobility: Secondary | ICD-10-CM

## 2019-11-20 DIAGNOSIS — R2681 Unsteadiness on feet: Secondary | ICD-10-CM

## 2019-11-21 NOTE — Therapy (Signed)
[facility] [address] [place], [place], [address] Phone: [phone]   Fax:  [phone]  Physical Therapy Treatment/10th Visit Progress Note  Patient Details  Name: Brittney Maddox MRN: [id number] Date of Birth: 1966/09/08 Referring Provider (PT): Charlott Rakes, MD   Encounter Date: 11/20/2019  PT End of Session - 11/21/19 2008    Visit Number  10    Number of Visits  22   Date for PT Re-Evaluation  75/44/92   per recert 0/06/711   Authorization Type  MCR: KX mod at 15th visit, progress note at 10th visit    PT Start Time  1038    PT Stop Time  1116    PT Time Calculation (min)  38 min    Activity Tolerance  Patient tolerated treatment well;Patient limited by fatigue    Behavior During Therapy  [facility] for tasks assessed/performed       Past Medical History:  Diagnosis Date  . Allergy   . Asthma   . DVT (deep venous thrombosis) ([place])   . Lupus ([place])   . Myocardial infarction ([place])   . Rheumatoid arthritis ([place])   . Trigeminal neuralgia     Past Surgical History:  Procedure Laterality Date  . ABDOMINAL HYSTERECTOMY    . CESAREAN SECTION    . NERVE AND TENDON REPAIR Right   . ROTATOR CUFF REPAIR Left     There were no vitals filed for this visit.  Subjective Assessment - 11/20/19 1544    Subjective  Spoke with new family care physician.  Want to proceed with the brace with whatever is quickest.    Limitations  Standing;Lifting;Walking    How long can you sit comfortably?  10 min    How long can you stand comfortably?  2-3 min    How long can you walk comfortably?  2-3 min    Diagnostic tests  07/01/2019 Impression: These findings are consistent with all osteoarthritis and mild chondromalacia patella.    Patient Stated Goals  improve balance, decrease pain    Currently in Pain?  Yes    Pain Score  8     Pain Location  Head    Pain Orientation  Posterior    Pain Descriptors / Indicators  Aching     Pain Type  Chronic pain    Pain Onset  More than a month ago    Pain Frequency  Constant    Aggravating Factors   worse when not taking medications    Pain Relieving Factors  medications, lying down    Multiple Pain Sites  Yes    Pain Score  7    Pain Location  --   Trunk   Pain Orientation  Left    Pain Descriptors / Indicators  --   Pulled muscle   Pain Type  Acute pain    Pain Onset  Yesterday    Pain Frequency  Constant    Aggravating Factors   exercises yesterday    Pain Relieving Factors  nothing         [facility] PT Assessment - 11/21/19 2002      Strength   Strength Assessment Site  Hip;Knee;Ankle    Right/Left Hip  Right;Left    Right Hip Flexion  3-/5    Right Hip ABduction  3-/5    Left Hip Flexion  3+/5    Left Hip ABduction  3+/5    Right/Left Knee  Right;Left    Right  Knee Flexion  3-/5    Right Knee Extension  3-/5    Left Knee Flexion  3+/5    Left Knee Extension  3+/5    Right/Left Ankle  Left;Right    Right Ankle Dorsiflexion  3-/5    Left Ankle Dorsiflexion  3+/5      Berg Balance Test   Sit to Stand  Able to stand  independently using hands    Standing Unsupported  Able to stand 2 minutes with supervision   increased trunk sway   Sitting with Back Unsupported but Feet Supported on Floor or Stool  Able to sit safely and securely 2 minutes    Stand to Sit  Controls descent by using hands    Transfers  Able to transfer with verbal cueing and /or supervision    Standing Unsupported with Eyes Closed  Able to stand 3 seconds    Standing Unsupported with Feet Together  Able to place feet together independently and stand for 1 minute with supervision    From Standing, Reach Forward with Outstretched Arm  Reaches forward but needs supervision    From Standing Position, Pick up Object from Floor  Unable to try/needs assist to keep balance    From Standing Position, Turn to Look Behind Over each Shoulder  Needs assist to keep from losing balance and falling    Reports feeling dizzy   Turn 360 Degrees  Needs assistance while turning    Standing Unsupported, Alternately Place Feet on Step/Stool  Needs assistance to keep from falling or unable to try    Standing Unsupported, One Foot in Front  Loses balance while stepping or standing    Standing on One Leg  Unable to try or needs assist to prevent fall    Total Score  21    Berg comment:  Improved from 14/56       Verbally reviewed HEP, and pt reports she is performing exercises. Performed sit<>stand x 5 reps, then performed stride stance sit<>stand x 5 reps with RLE tucked posteriorly, min guard assistance upon standing.  Self Care: Discussed progress towards goals, POC.  Discussed continued risk of falls and benefits of continuing therapy.  Discussed order that was requested and placed on chart for AFO consult and gave patient options for [employer] or [facility] for AFO consult.  Pt agreeable to [facility] and for PT to proceed to get that process started for AFO consult.  Discussed importance of consistent attendance to therapy and pt agreeable to continue, acknowledgeing her move is nearly complete and she will be more available for upcoming PT scheduling.                      PT Short Term Goals - 10/11/19 1109      PT SHORT TERM GOAL #1   Title  pt to be I with inital HEP    Baseline  states she does her HEP every day    Period  Weeks    Status  Achieved      PT SHORT TERM GOAL #2   Title  assess BERG balance and adjust LTG based on assessment    Period  Weeks    Status  Achieved        PT Long Term Goals - 11/20/19 1611      PT LONG TERM GOAL #1   Title  pt to increase gross hip strengtht o >/= 4/5 to promote safety with walking/ standing  (TARGET FOR  ALL LTGS, per 6 week POC Recert 4/54/0981:  1/91/4782)    Baseline  R hip strength 3-/5, L hip strength 3+/5    Period  Weeks    Status  Not Met      PT LONG TERM GOAL #2   Title  pt to be able to walk/ stand >/=30 min  and navigate u/dwon >/= 6 steps with LRAD for functional endurance and safety for in home/ community ambulation    Baseline  per patient report 11/20/2019 15 minutes    Period  Weeks    Status  Not Met      PT LONG TERM GOAL #3   Title  pt to be I with all HEP given as of last visit to maintain and progress current level of function    Period  Weeks    Status  On-going      PT LONG TERM GOAL #4   Title  increase Berg balance to >/= 40/56 to demo improvment in balance and stability    Baseline  pt notes feeling mor eoff today scored 14/56; 21/56 11/20/2019    Period  Weeks    Status  Not Met            Plan - 11/21/19 2008    Clinical Impression Statement  10th Visit Progress Note, covering dates 08/20/2019-11/20/2019.  Pt was [facility] from [facility] to [facility], with today being only pt's 3rd visit at [facility] (due to scheduling conflicts with pt moving).  Assessed LTGs this visit:  LTG 1 not met for hip strength.  R hip strength remains 3-/5; L hip strength has improved to 3+/5.  LTG 2 not met for standing tolerance, as pt reports standing for up to 15 minutes at a time prior to fatigue.  LTG 3 ongoing, with pt reporting performance of HEP, but not able to formally assess due to time constraints today.  LTG 4 not met, with Berg score 21/56, improved from 14/56.  Pt reports ongoing pain and fatigue, which is a limiting factor in PT sessions.  Pt's RLE weakness is contributing to decreased stability with gait, with decreased R foot clearance.  She does have an order for AFO and PT will be assisting pt in getting appropriate AFO for improved foot clearance with gait.  Pt will benefit from continued skilled PT to address strength, balance, and gait training with appropriate AFO for improved overall mobility and decreased fall risk.  Pt reports her move will be completed soon, and she is committed to consistent PT attendance for future scheduled appointments.     Examination-Activity Limitations  Stand;Stairs;Squat;Locomotion Level;Bend;Bathing    PT Frequency  2x / week    PT Duration  6 weeks   per recert 9/56/2130   PT Treatment/Interventions  ADLs/Self Care Home Management;Cryotherapy;Electrical Stimulation;Iontophoresis 59m/ml Dexamethasone;Moist Heat;Ultrasound;Gait training;Stair training;Functional mobility training;Therapeutic activities;Therapeutic exercise;Balance training;Neuromuscular re-education;Manual techniques;Passive range of motion;Taping;Dry needling    PT Next Visit Plan  Order is on chart for AFO; Pt agrees to [facility] and PT will f/u with [facility] for them to come out to PT session for AFO consult; gait/exercise with trial PLS AFO; review updates to HEP and continue core/lower extremity strengthening, balance training    PT [place]- seated clamshells, LAQ, marching and heel/ toe raises    Consulted and Agree with Plan of Care  Patient       Patient will benefit from skilled therapeutic intervention in order to improve the  following deficits and impairments:  Abnormal gait, Pain, Improper body mechanics, Postural dysfunction, Decreased strength, Difficulty walking, Decreased balance, Decreased activity tolerance, Decreased coordination, Decreased endurance, Decreased range of motion, Decreased mobility  Visit Diagnosis: Unsteadiness on feet  Other abnormalities of gait and mobility  Muscle weakness (generalized)     Problem List Patient Active Problem List   Diagnosis Date Noted  . Cervicalgia 06/11/2019  . Right trigeminal neuralgia 06/11/2019  . Prediabetes 08/20/2013  . Elevated LFTs 08/20/2013  . Fatty liver 08/20/2013  . Vaginitis and vulvovaginitis 07/12/2013  . 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 Lupus (systemic lupus erythematosus) ([place]) 06/24/2013    Taisia Fantini W. 11/21/2019, 8:20 PM  Frazier Butt., PT   [facility] [address]  [place], [place], [address] Phone: [phone]   Fax:  [phone]  Name: BRITTNYE JOSEPHS MRN: [id number] Date of Birth: 1966-08-01  New goals for recert: PT Short Term Goals - 11/21/19 2027      PT SHORT TERM GOAL #1   Title  Pt will verbalize understanding of fall prevention in home environment.  TARGET 12/20/2019    Time  4    Period  Weeks    Status  New      PT SHORT TERM GOAL #2   Title  Pt will be independent with revisions/updates to HEP for strength, balance, gait.    Time  4    Period  Weeks    Status  New      PT Long Term Goals - 11/21/19 2029      PT LONG TERM GOAL #1   Title  Pt will independently don/doff and verbalize proper wear/care of AFO.  TARGET 01/03/2020    Time  6    Period  Weeks    Status  New      PT LONG TERM GOAL #2   Title  pt to be able to walk/ stand >/=30 min and navigate u/dwon >/= 6 steps with LRAD for functional endurance and safety for in home/ community ambulation    Baseline  per patient report 11/20/2019 15 minutes    Time  6    Period  Weeks    Status  On-going      PT LONG TERM GOAL #3   Title  pt to be I with all HEP given as of last visit to maintain and progress current level of function    Time  6    Period  Weeks    Status  On-going      PT LONG TERM GOAL #4   Title  increase Berg balance to >/= 28/56 to demo improvment in balance and stability    Baseline  21/56 11/20/19    Time  6    Period  Weeks    Status  Revised      Mady Haagensen, PT 11/21/19 8:34 PM Phone: [phone] Fax: [phone]

## 2019-11-22 ENCOUNTER — Telehealth: Payer: Self-pay | Admitting: Rheumatology

## 2019-11-22 NOTE — Telephone Encounter (Signed)
A certified letter was sent to patient on 10/01/2019 dismissing her from this practice. Patient has established care with [facility], and needs to contact them for further assistance. Certified letter was returned as "unable to deliver as addressed".

## 2019-11-25 ENCOUNTER — Telehealth: Payer: Self-pay | Admitting: Physical Therapy

## 2019-11-25 NOTE — Telephone Encounter (Signed)
Spoke on the phone with [facility] with [facility] on 11/22/2019, and she requests PT to send face sheet and order info via fax, for AFO.  Meghan plans to be present at next PT visit for AFO consult.  PT faxed requested information this day to [place].  Mady Haagensen, PT 11/25/19 3:16 PM Phone: [phone] Fax: [phone]

## 2019-11-29 ENCOUNTER — Ambulatory Visit: Payer: Medicare Other | Admitting: Physical Therapy

## 2019-12-04 ENCOUNTER — Ambulatory Visit (AMBULATORY_SURGERY_CENTER): Payer: Self-pay | Admitting: *Deleted

## 2019-12-04 ENCOUNTER — Other Ambulatory Visit: Payer: Self-pay

## 2019-12-04 VITALS — Temp 97.5°F | Ht 60.0 in | Wt 145.0 lb

## 2019-12-04 DIAGNOSIS — Z01818 Encounter for other preprocedural examination: Secondary | ICD-10-CM

## 2019-12-04 DIAGNOSIS — Z800 Family history of malignant neoplasm of digestive organs: Secondary | ICD-10-CM

## 2019-12-04 DIAGNOSIS — Z86010 Personal history of colonic polyps: Secondary | ICD-10-CM

## 2019-12-04 NOTE — Progress Notes (Signed)
  No trouble with anesthesia, difficulty with intubation or hx/fam hx of malignant hyperthermia per pt  Pt is aware that care partner will wait in the car during procedure; if they feel like they will be too hot or cold to wait in the car; they may wait in the 4 th floor lobby. Patient is aware to bring only one care partner. We want them to wear a mask (we do not have any that we can provide them), practice social distancing, and we will check their temperatures when they get here.  I did remind the patient that their care partner needs to stay in the parking lot the entire time and have a cell phone available, we will call them when the pt is ready for discharge. Patient will wear mask into building.  covid test 12-13-19 at 1000  Pt has chronic constipation- 2 day Miralax/Miralax given  covid test 12-13-19 at 1000   No trouble with anesthesia, difficulty with intubation or hx/fam hx of malignant hyperthermia per pt   No egg or soy allergy  No home oxygen use   No medications for weight loss taken  emmi information given   Pt states she had colonoscopy 2 years ago in [place]- "had a lot of polyps removed and they told me they were the bad kind."

## 2019-12-05 ENCOUNTER — Other Ambulatory Visit: Payer: Self-pay | Admitting: Family Medicine

## 2019-12-05 ENCOUNTER — Telehealth: Payer: Self-pay | Admitting: Physical Therapy

## 2019-12-05 DIAGNOSIS — G44091 Other trigeminal autonomic cephalgias (TAC), intractable: Secondary | ICD-10-CM

## 2019-12-05 NOTE — Telephone Encounter (Signed)
PT received message that patient called wondering about her brace fitting.  PT phoned and spoke to patient, letting her know that [place] with [facility] is scheduled to come to 12/17/2019 PT appointment (Orthotics consult was scheduled for her 11/29/19 PT appointment, which pt had to cancel due to weather).  Pt verbalizes understanding of AFO/orthotics consult 12/17/2019.  Mady Haagensen, PT 12/05/19 3:03 PM Phone: [phone] Fax: [phone]

## 2019-12-10 ENCOUNTER — Encounter: Payer: Self-pay | Admitting: Physical Therapy

## 2019-12-10 ENCOUNTER — Ambulatory Visit: Payer: Medicare Other | Admitting: Physical Therapy

## 2019-12-10 ENCOUNTER — Other Ambulatory Visit: Payer: Self-pay

## 2019-12-10 DIAGNOSIS — M6281 Muscle weakness (generalized): Secondary | ICD-10-CM

## 2019-12-10 DIAGNOSIS — R2681 Unsteadiness on feet: Secondary | ICD-10-CM

## 2019-12-10 DIAGNOSIS — M21371 Foot drop, right foot: Secondary | ICD-10-CM

## 2019-12-10 DIAGNOSIS — R2689 Other abnormalities of gait and mobility: Secondary | ICD-10-CM

## 2019-12-11 NOTE — Therapy (Signed)
[facility] [address] [place], [place], [address] Phone: [phone]   Fax:  [phone]  Physical Therapy Treatment  Patient Details  Name: Brittney Maddox MRN: [id number] Date of Birth: 1966/03/23 Referring Provider (PT): Charlott Rakes, MD   Encounter Date: 12/10/2019  PT End of Session - 12/10/19 1112    Visit Number  11    Number of Visits  22    Date for PT Re-Evaluation  [id number]   per recert 0000000   Authorization Type  MCR: KX mod at 15th visit, progress note at 10th visit    Progress Note Due on Visit  20    PT Start Time  1103    PT Stop Time  1143    PT Time Calculation (min)  40 min    Equipment Utilized During Treatment  Gait belt;Other (comment)   AFO with session   Activity Tolerance  Patient tolerated treatment well;Patient limited by fatigue    Behavior During Therapy  [facility] for tasks assessed/performed       Past Medical History:  Diagnosis Date  . Allergy   . Anemia   . Asthma   . Blood transfusion without reported diagnosis   . DVT (deep venous thrombosis) ([place])    2020- no blood thinners  . Lupus ([place])   . Myocardial infarction ([place])    "slight" per pt- 2014  . Rheumatoid arthritis ([place])   . Trigeminal neuralgia     Past Surgical History:  Procedure Laterality Date  . ABDOMINAL HYSTERECTOMY    . CESAREAN SECTION    . COLONOSCOPY     within 2 years- per pt "had a lot of polyps"  . NERVE AND TENDON REPAIR Right    right hand  . ROTATOR CUFF REPAIR Left   . STOMACH SURGERY     gastric sleeve    There were no vitals filed for this visit.  Subjective Assessment - 12/10/19 1109    Subjective  Had the head injection with Neuro for headaches, still having them. Also reports the doctor discontinue Topamax which she wants back on. Also out of Imuran, needs to call in the refill. No falls. Drove to therapy today.    Limitations  Standing;Lifting;Walking    How long can you sit  comfortably?  10 min    How long can you stand comfortably?  2-3 min    How long can you walk comfortably?  2-3 min    Diagnostic tests  07/01/2019 Impression: These findings are consistent with all osteoarthritis and mild chondromalacia patella.    Patient Stated Goals  improve balance, decrease pain    Currently in Pain?  Yes    Pain Score  7     Pain Location  Head    Pain Orientation  Posterior    Pain Descriptors / Indicators  Aching    Pain Type  Chronic pain    Pain Onset  More than a month ago    Pain Frequency  Constant    Aggravating Factors   worse when not taking medications    Pain Relieving Factors  medication, lying down          [facility] Adult PT Treatment/Exercise - 12/10/19 1116      Transfers   Transfers  Sit to Stand;Stand to Sit    Sit to Stand  4: Min guard;4: Min assist;With upper extremity assist;From bed;From chair/3-in-1    Sit to Stand Details (indicate cue type and reason)  assist needed when fatigued to safely stand to RW with UE support. pt with posterior balance loss several times in session.    Stand to Sit  4: Min guard;With upper extremity assist;To bed;To chair/3-in-1      Ambulation/Gait   Ambulation/Gait  Yes    Ambulation/Gait Assistance  4: Min guard    Ambulation/Gait Assistance Details  continued with use of posterior Ottobock AFO on right LE. cues needed to wide base of support, increase step length and for posture with gait. Pt noted to have a heavy UE reliance on walker with gait.     Ambulation Distance (Feet)  230 Feet   x1 with one standing rest   Assistive device  Rolling walker    Gait Pattern  Step-to pattern;Decreased stride length;Trendelenburg;Antalgic;Trunk flexed;Shuffle;Narrow base of support;Decreased hip/knee flexion - right;Decreased dorsiflexion - right    Ambulation Surface  Level;Indoor      Neuro Re-ed    Neuro Re-ed Details   for strengthening/muscle re-ed: in tall kneeling with UE support on Kaye bench- mini squats  for 10 reps with cues/assist for equal LE weight bearing/full return to tall kneeling. staying in tall kneeling alternating UE raises for 10 reps each side with emphasis on tall posture for 10 reps each side, up to min assist needed with both these tasks; seated at edge of mat table: in staggered stance with right foot back/left foot forward for sit<>stands for 10 reps, UE assist needed with cues for full upright standing and controlled descent with sitting down.           PT Short Term Goals - 11/21/19 2027      PT SHORT TERM GOAL #1   Title  Pt will verbalize understanding of fall prevention in home environment.  TARGET 12/20/2019    Time  4    Period  Weeks    Status  New      PT SHORT TERM GOAL #2   Title  Pt will be independent with revisions/updates to HEP for strength, balance, gait.    Time  4    Period  Weeks    Status  New        PT Long Term Goals - 11/21/19 2029      PT LONG TERM GOAL #1   Title  Pt will independently don/doff and verbalize proper wear/care of AFO.  TARGET 01/03/2020    Time  6    Period  Weeks    Status  New      PT LONG TERM GOAL #2   Title  pt to be able to walk/ stand >/=30 min and navigate u/dwon >/= 6 steps with LRAD for functional endurance and safety for in home/ community ambulation    Baseline  per patient report 11/20/2019 15 minutes    Time  6    Period  Weeks    Status  On-going      PT LONG TERM GOAL #3   Title  pt to be I with all HEP given as of last visit to maintain and progress current level of function    Time  6    Period  Weeks    Status  On-going      PT LONG TERM GOAL #4   Title  increase Berg balance to >/= 28/56 to demo improvment in balance and stability    Baseline  21/56 11/20/19    Time  6    Period  Weeks    Status  Revised            Plan - 12/10/19 1113    Clinical Impression Statement  Today's skilled session continued to address use of Posterior Ottobock brace with gait. Pt noted to have improved  foot clearance with use of brace. Continues with poor step placement (narrowed at times) and decreased hip/knee flexion with gait. Remainder of session continued to address LE and core strengthening with rest breaks taken as needed due to fatigue. The pt is progressing toward goals and should benefit from continued PT to progress toward unmet goals.    Examination-Activity Limitations  Stand;Stairs;Squat;Locomotion Level;Bend;Bathing    PT Frequency  2x / week    PT Duration  6 weeks   per recert 0000000   PT Treatment/Interventions  ADLs/Self Care Home Management;Cryotherapy;Electrical Stimulation;Iontophoresis 4mg /ml Dexamethasone;Moist Heat;Ultrasound;Gait training;Stair training;Functional mobility training;Therapeutic activities;Therapeutic exercise;Balance training;Neuromuscular re-education;Manual techniques;Passive range of motion;Taping;Dry needling    PT Next Visit Plan  Order is on chart for AFO; Pt agrees to [facility] and PT will f/u with [facility] for them to come out to PT session for AFO consult; gait/exercise with trial PLS AFO; continue core/lower extremity strengthening, balance training    PT [place] - seated clamshells, LAQ, marching and heel/ toe raises    Consulted and Agree with Plan of Care  Patient       Patient will benefit from skilled therapeutic intervention in order to improve the following deficits and impairments:  Abnormal gait, Pain, Improper body mechanics, Postural dysfunction, Decreased strength, Difficulty walking, Decreased balance, Decreased activity tolerance, Decreased coordination, Decreased endurance, Decreased range of motion, Decreased mobility  Visit Diagnosis: Unsteadiness on feet  Other abnormalities of gait and mobility  Muscle weakness (generalized)  Right foot drop     Problem List Patient Active Problem List   Diagnosis Date Noted  . Cervicalgia 06/11/2019  . Right trigeminal neuralgia 06/11/2019  . Prediabetes  08/20/2013  . Elevated LFTs 08/20/2013  . Fatty liver 08/20/2013  . Vaginitis and vulvovaginitis 07/12/2013  . 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 Lupus (systemic lupus erythematosus) ([place]) 06/24/2013    Willow Ora, PTA, [facility] [address], [place], [place] [address] [phone] 12/11/19, 11:30 AM   Name: NICHEL BETHELL MRN: [id number] Date of Birth: 03/14/1966

## 2019-12-12 ENCOUNTER — Other Ambulatory Visit: Payer: Self-pay | Admitting: Neurology

## 2019-12-13 ENCOUNTER — Ambulatory Visit (INDEPENDENT_AMBULATORY_CARE_PROVIDER_SITE_OTHER): Payer: Medicare Other

## 2019-12-13 ENCOUNTER — Other Ambulatory Visit: Payer: Self-pay | Admitting: Gastroenterology

## 2019-12-13 DIAGNOSIS — Z1159 Encounter for screening for other viral diseases: Secondary | ICD-10-CM

## 2019-12-14 LAB — SARS CORONAVIRUS 2 (TAT 6-24 HRS): SARS Coronavirus 2: NEGATIVE

## 2019-12-16 ENCOUNTER — Ambulatory Visit
Admission: RE | Admit: 2019-12-16 | Discharge: 2019-12-16 | Disposition: A | Discharge status: Home / Self Care | Payer: Medicare Other | Source: Ambulatory Visit | Attending: Internal Medicine | Admitting: Internal Medicine

## 2019-12-16 ENCOUNTER — Other Ambulatory Visit: Payer: Self-pay

## 2019-12-16 DIAGNOSIS — Z1231 Encounter for screening mammogram for malignant neoplasm of breast: Secondary | ICD-10-CM

## 2019-12-17 ENCOUNTER — Encounter: Payer: Self-pay | Admitting: Physical Therapy

## 2019-12-17 ENCOUNTER — Telehealth: Payer: Self-pay | Admitting: Internal Medicine

## 2019-12-17 ENCOUNTER — Ambulatory Visit: Payer: Medicare Other | Attending: Family Medicine | Admitting: Physical Therapy

## 2019-12-17 DIAGNOSIS — M21371 Foot drop, right foot: Secondary | ICD-10-CM | POA: Diagnosis present

## 2019-12-17 DIAGNOSIS — R2681 Unsteadiness on feet: Secondary | ICD-10-CM | POA: Diagnosis present

## 2019-12-17 DIAGNOSIS — R2689 Other abnormalities of gait and mobility: Secondary | ICD-10-CM | POA: Diagnosis not present

## 2019-12-17 DIAGNOSIS — M6281 Muscle weakness (generalized): Secondary | ICD-10-CM | POA: Diagnosis present

## 2019-12-17 NOTE — Telephone Encounter (Signed)
Patient states she has a colonoscopy tomorrow and would like for you to call [phone] if she is unable to answer

## 2019-12-17 NOTE — Telephone Encounter (Signed)
Patient is calling in regards to medication concerns and brace that she got fitted for.  Please contact patient

## 2019-12-17 NOTE — Patient Instructions (Signed)
Expect to hear from [facility] at [facility] about an appointment in their office for the AFO (ankle-foot orthotic) fitting ([facility] [phone])   -Make sure to wear your compression socks daily  -Please try to get a size 7 sneaker to allow more room for the AFO

## 2019-12-17 NOTE — Therapy (Signed)
[facility] [address] [place], [place], [address] Phone: [phone]   Fax:  [phone]  Physical Therapy Treatment  Patient Details  Name: Brittney Maddox MRN: [id number] Date of Birth: 11-25-1965 Referring Provider (PT): Charlott Rakes, MD   Encounter Date: 12/17/2019  PT End of Session - 12/17/19 1050    Visit Number  12    Number of Visits  22    Date for PT Re-Evaluation  [id number]   per recert 0000000   Authorization Type  MCR: KX mod at 15th visit, progress note at 10th visit    Progress Note Due on Visit  20    PT Start Time  0935    PT Stop Time  1023    PT Time Calculation (min)  48 min    Equipment Utilized During Treatment  Gait belt;Other (comment)   AFO with session   Activity Tolerance  Patient tolerated treatment well   Pt rates pain decreased to 7/10 after session completed   Behavior During Therapy  [facility] for tasks assessed/performed       Past Medical History:  Diagnosis Date  . Allergy   . Anemia   . Asthma   . Blood transfusion without reported diagnosis   . DVT (deep venous thrombosis) ([place])    2020- no blood thinners  . Lupus ([place])   . Myocardial infarction ([place])    "slight" per pt- 2014  . Rheumatoid arthritis ([place])   . Trigeminal neuralgia     Past Surgical History:  Procedure Laterality Date  . ABDOMINAL HYSTERECTOMY    . CESAREAN SECTION    . COLONOSCOPY     within 2 years- per pt "had a lot of polyps"  . NERVE AND TENDON REPAIR Right    right hand  . ROTATOR CUFF REPAIR Left   . STOMACH SURGERY     gastric sleeve    There were no vitals filed for this visit.  Subjective Assessment - 12/17/19 0941    Subjective  My ankle is bothering me a lot today.  R leg seems swollen a little today, but not using compresssion socks.  Reports no falls.    Limitations  Standing;Lifting;Walking    How long can you sit comfortably?  10 min    How long can you stand comfortably?   2-3 min    How long can you walk comfortably?  2-3 min    Diagnostic tests  07/01/2019 Impression: These findings are consistent with all osteoarthritis and mild chondromalacia patella.    Patient Stated Goals  improve balance, decrease pain    Currently in Pain?  Yes    Pain Score  10-Worst pain ever    Pain Location  Ankle    Pain Orientation  Right    Pain Descriptors / Indicators  Aching    Pain Onset  More than a month ago    Pain Frequency  Constant    Aggravating Factors   probably did too much with moving    Pain Relieving Factors  unsure                       [facility] Adult PT Treatment/Exercise - 12/17/19 1046      Transfers   Transfers  Sit to Stand;Stand to Sit    Sit to Stand  4: Min guard;With upper extremity assist;From bed    Sit to Stand Details (indicate cue type and reason)  Cues to place  RLE under BOS (pt tends to leave RLE extended to try to push up to walker from that position).  PT provides verbal and tactile cues.    Stand to Sit  4: Min guard;With upper extremity assist;To bed      Ambulation/Gait   Ambulation/Gait  Yes    Ambulation/Gait Assistance  4: Min guard    Ambulation/Gait Assistance Details  Trials of various AFOs with gait:  see note below    Ambulation Distance (Feet)  115 Feet   x 4 reps (AFO trials); then 100 ft x 2 no AFO   Assistive device  Rolling walker    Gait Pattern  Step-to pattern;Decreased stride length;Trendelenburg;Antalgic;Trunk flexed;Shuffle;Narrow base of support;Decreased hip/knee flexion - right;Decreased dorsiflexion - right;Step-through pattern;Poor foot clearance - right    Ambulation Surface  Level;Indoor      Trial of various AFOs on RLE, as follows:  Pt uses RW with min guard  1-Spry Step Max-initial cues for hip flexion, with improved R foot clearance, heelstrike, consistent step through pattern of gait (per Meghan, Will try Allard AFO which has more flexible front to allow for her edema).  Pt reports  having good support at R ankle.  2-Ottobach Reaction (medial strut)-feels not quite as stable/supported at ankle.  Pt has improved foot clearance, occasional foot drag; pt noted to have slight R knee recurvatum at times (had to use larger size clinic shoe on RLE for trial)  3-Spry Step Plus (lateral strut and top aspect only)-improved foot clearance and step length, heelstrike.  Smaller R step length, but does not need cues for hip flexion for initial foot clearance.  Pt reports not feeling as supported at ankle.  Minimal line from lateral strut on L calf upon removal of brace.  (Meghan recommends using pt's compression socks daily to help control swelling; biggest concern is the swelling)  Additional 115 ft of gait with RW, no AFO, then 100 ft out of clinic, with pt noted to have improved foot clearance, improved knee extension RLE with stance phase of gait.  (Initial gait into therapy gym x 100 ft, was more antalgic pattern on RLE, decreased RLE foot clearance, knee flexed during stance phase.)  Discussed options for AFO, with pt, orthotist, PT agreeing on Spry Step Max as best option, but Meghan will try Allard AFO for improved accomodation of her RLE edema.  [place] educates pt that she will follow up with patient back in her office in case adjustments need to be made.  They will run through insurance and contact MD if needed (for face to face MD visit).  Pt will need to get next size shoe.  Pt verbalizes understanding.      PT Education - 12/17/19 1049    Education Details  Next steps for AFO fitting (awaiting [facility] to contact patient for fitting); educated on purpose benefits of various AFOs, ultimately decided on Allard (Spry Step) AFO for RLE    Person(s) Educated  Patient    Methods  Explanation    Comprehension  Verbalized understanding       PT Short Term Goals - 11/21/19 2027      PT SHORT TERM GOAL #1   Title  Pt will verbalize understanding of fall prevention in home  environment.  TARGET 12/20/2019    Time  4    Period  Weeks    Status  New      PT SHORT TERM GOAL #2   Title  Pt will be independent  with revisions/updates to HEP for strength, balance, gait.    Time  4    Period  Weeks    Status  New        PT Long Term Goals - 11/21/19 2029      PT LONG TERM GOAL #1   Title  Pt will independently don/doff and verbalize proper wear/care of AFO.  TARGET 01/03/2020    Time  6    Period  Weeks    Status  New      PT LONG TERM GOAL #2   Title  pt to be able to walk/ stand >/=30 min and navigate u/dwon >/= 6 steps with LRAD for functional endurance and safety for in home/ community ambulation    Baseline  per patient report 11/20/2019 15 minutes    Time  6    Period  Weeks    Status  On-going      PT LONG TERM GOAL #3   Title  pt to be I with all HEP given as of last visit to maintain and progress current level of function    Time  6    Period  Weeks    Status  On-going      PT LONG TERM GOAL #4   Title  increase Berg balance to >/= 28/56 to demo improvment in balance and stability    Baseline  21/56 11/20/19    Time  6    Period  Weeks    Status  Revised            Plan - 12/17/19 1051    Clinical Impression Statement  Orthotist present at visit today, with pt trialing various AFOs during session.  All braces provide additional stability to RLE and help to improve foot clearance/heelstrike.  Ultimately, pt feels most stable and gait pattern is most normalized with use of Spry Step Max brace, but due to pt's RLE swelling, orthotist recommends Allard type brace that is more expandable through the front to allow for pt's leg swelling.    Examination-Activity Limitations  Stand;Stairs;Squat;Locomotion Level;Bend;Bathing    PT Frequency  2x / week    PT Duration  6 weeks   per recert 0000000   PT Treatment/Interventions  ADLs/Self Care Home Management;Cryotherapy;Electrical Stimulation;Iontophoresis 4mg /ml Dexamethasone;Moist  Heat;Ultrasound;Gait training;Stair training;Functional mobility training;Therapeutic activities;Therapeutic exercise;Balance training;Neuromuscular re-education;Manual techniques;Passive range of motion;Taping;Dry needling    PT Next Visit Plan  Trial of SpryStep Max (full anterior portion of AFO) on RLE until [facility] get pt her own; need to assess STGs and update exercises as able.    PT Home Exercise Plan  [id number] - seated clamshells, LAQ, marching and heel/ toe raises    Consulted and Agree with Plan of Care  Patient       Patient will benefit from skilled therapeutic intervention in order to improve the following deficits and impairments:  Abnormal gait, Pain, Improper body mechanics, Postural dysfunction, Decreased strength, Difficulty walking, Decreased balance, Decreased activity tolerance, Decreased coordination, Decreased endurance, Decreased range of motion, Decreased mobility  Visit Diagnosis: Other abnormalities of gait and mobility     Problem List Patient Active Problem List   Diagnosis Date Noted  . Cervicalgia 06/11/2019  . Right trigeminal neuralgia 06/11/2019  . Prediabetes 08/20/2013  . Elevated LFTs 08/20/2013  . Fatty liver 08/20/2013  . Vaginitis and vulvovaginitis 07/12/2013  . 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 Lupus (systemic lupus erythematosus) ([place]) 06/24/2013    Kyira Volkert W. 12/17/2019, 2:21 PM  Frazier Butt., PT  [facility] [address] [place], [place], [address] Phone: [phone]   Fax:  [phone]  Name: Brittney Maddox MRN: [id number] Date of Birth: 1966/03/31

## 2019-12-18 ENCOUNTER — Encounter: Payer: Self-pay | Admitting: Gastroenterology

## 2019-12-18 ENCOUNTER — Other Ambulatory Visit: Payer: Self-pay

## 2019-12-18 ENCOUNTER — Ambulatory Visit (AMBULATORY_SURGERY_CENTER): Payer: Medicare Other | Admitting: Gastroenterology

## 2019-12-18 VITALS — BP 118/61 | HR 70 | Temp 97.7°F | Resp 19 | Ht 60.0 in | Wt 145.0 lb

## 2019-12-18 DIAGNOSIS — D120 Benign neoplasm of cecum: Secondary | ICD-10-CM

## 2019-12-18 DIAGNOSIS — Z800 Family history of malignant neoplasm of digestive organs: Secondary | ICD-10-CM

## 2019-12-18 DIAGNOSIS — D122 Benign neoplasm of ascending colon: Secondary | ICD-10-CM

## 2019-12-18 DIAGNOSIS — D125 Benign neoplasm of sigmoid colon: Secondary | ICD-10-CM | POA: Diagnosis not present

## 2019-12-18 DIAGNOSIS — D124 Benign neoplasm of descending colon: Secondary | ICD-10-CM

## 2019-12-18 DIAGNOSIS — Z86010 Personal history of colonic polyps: Secondary | ICD-10-CM | POA: Diagnosis not present

## 2019-12-18 MED ORDER — SODIUM CHLORIDE 0.9 % IV SOLN: 500.0000 mL | INTRAVENOUS | Status: DC

## 2019-12-18 NOTE — Telephone Encounter (Signed)
Left voice mail to call back 

## 2019-12-18 NOTE — Progress Notes (Signed)
HEALTH INFORMATION RELEASE FORM FILLED OUT BY PT AND GIVEN TO FRONT DESK PERSONAL TO GET HEALTH INFO FROM [facility] [place]

## 2019-12-18 NOTE — Progress Notes (Signed)
LC - Temp KA - VS   Pt's states no medical or surgical changes since previsit or office visit.

## 2019-12-18 NOTE — Progress Notes (Signed)
Report given to PACU, vss 

## 2019-12-18 NOTE — Patient Instructions (Addendum)
Dr Tarri Glenn would like you to have records from [email or web address] Kitchen  HANDOUT ON POLYPS GIVEN TO YOU TODAY    AWAIT PATHOLOGY RESULTS ON POLYPS REMOVED TODAY     YOU HAD AN ENDOSCOPIC PROCEDURE TODAY AT [facility]:   Refer to the procedure report that was given to you for any specific questions about what was found during the examination.  If the procedure report does not answer your questions, please call your gastroenterologist to clarify.  If you requested that your care partner not be given the details of your procedure findings, then the procedure report has been included in a sealed envelope for you to review at your convenience later.  YOU SHOULD EXPECT: Some feelings of bloating in the abdomen. Passage of more gas than usual.  Walking can help get rid of the air that was put into your GI tract during the procedure and reduce the bloating. If you had a lower endoscopy (such as a colonoscopy or flexible sigmoidoscopy) you may notice spotting of blood in your stool or on the toilet paper. If you underwent a bowel prep for your procedure, you may not have a normal bowel movement for a few days.  Please Note:  You might notice some irritation and congestion in your nose or some drainage.  This is from the oxygen used during your procedure.  There is no need for concern and it should clear up in a day or so.  SYMPTOMS TO REPORT IMMEDIATELY:   Following lower endoscopy (colonoscopy or flexible sigmoidoscopy):  Excessive amounts of blood in the stool  Significant tenderness or worsening of abdominal pains  Swelling of the abdomen that is new, acute  Fever of 100F or higher    For urgent or emergent issues, a gastroenterologist can be reached at any hour by calling [phone]. Do not use MyChart messaging for  urgent concerns.    DIET:  We do recommend a small meal at first, but then you may proceed to your regular diet.  Drink plenty of fluids but you should avoid alcoholic beverages for 24 hours.  ACTIVITY:  You should plan to take it easy for the rest of today and you should NOT DRIVE or use heavy machinery until tomorrow (because of the sedation medicines used during the test).    FOLLOW UP: Our staff will call the number listed on your records 48-72 hours following your procedure to check on you and address any questions or concerns that you may have regarding the information given to you following your procedure. If we do not reach you, we will leave a message.  We will attempt to reach you two times.  During this call, we will ask if you have developed any symptoms of COVID 19. If you develop any symptoms (ie: fever, flu-like symptoms, shortness of breath, cough etc.) before then, please call [phone].  If you test positive for Covid 19 in the 2 weeks post procedure, please call and report this information to [place].    If any biopsies were taken you will be contacted by phone or by letter within the next 1-3 weeks.  Please call us at [phone] if you have not heard about the biopsies in 3 weeks.    SIGNATURES/CONFIDENTIALITY: You and/or your care partner have signed paperwork which will be entered  into your electronic medical record.  These signatures attest to the fact that that the information above on your After Visit Summary has been reviewed and is understood.  Full responsibility of the confidentiality of this discharge information lies with you and/or your care-partner.

## 2019-12-18 NOTE — Op Note (Addendum)
[facility] Patient Name: Brittney Maddox Procedure Date: 12/18/2019 11:29 AM MRN: [id number] Endoscopist: Thornton Park MD, MD Age: 54 Referring MD:  Date of Birth: [birth year] Gender: Female Account #: [id number] Procedure:                Colonoscopy Indications:              High risk colon cancer surveillance: Personal                            history of colonic polyps                           Patient reports 9 polyps removed on colonoscopy at                            [facility] 2 years ago.                            Surveillance colonoscopy recommended in 1 year.                           Father with colon cancer at age 53. Medicines:                Monitored Anesthesia Care Procedure:                Pre-Anesthesia Assessment:                           - Prior to the procedure, a History and Physical                            was performed, and patient medications and                            allergies were reviewed. The patient's tolerance of                            previous anesthesia was also reviewed. The risks                            and benefits of the procedure and the sedation                            options and risks were discussed with the patient.                            All questions were answered, and informed consent                            was obtained. Prior Anticoagulants: The patient has                            taken no previous anticoagulant or antiplatelet  agents. ASA Grade Assessment: III - A patient with                            severe systemic disease. After reviewing the risks                            and benefits, the patient was deemed in                            satisfactory condition to undergo the procedure.                           After obtaining informed consent, the colonoscope                            was passed under direct vision. Throughout the                procedure, the patient's blood pressure, pulse, and                            oxygen saturations were monitored continuously. The                            Colonoscope was introduced through the anus and                            advanced to the 3 cm into the ileum. A second                            forward view of the right colon was performed. The                            colonoscopy was performed without difficulty. The                            patient tolerated the procedure well. The quality                            of the bowel preparation was good. The terminal                            ileum, ileocecal valve, appendiceal orifice, and                            rectum were photographed. Scope In: 11:40:32 AM Scope Out: 11:58:57 AM Scope Withdrawal Time: 0 hours 14 minutes 48 seconds  Total Procedure Duration: 0 hours 18 minutes 25 seconds  Findings:                 The perianal and digital rectal examinations were                            normal.  Non-bleeding internal hemorrhoids were found.                           A diffuse area of moderate melanosis was found in                            the entire colon.                           Three flat polyps were found in the sigmoid colon.                            The polyps were less than 1 mm in size. These                            polyps were removed with a cold biopsy forceps                            after failed attempt at removal with a snare.                            Resection and retrieval were complete. Estimated                            blood loss was minimal.                           Two sessile polyps were found in the descending                            colon. The polyps were 1 to 2 mm in size. These                            polyps were removed with a cold snare. Resection                            and retrieval were complete. Estimated blood loss                             was minimal.                           A 3 mm polyp was found in the ascending colon. The                            polyp was sessile. The polyp was removed with a                            cold snare. Resection and retrieval were complete.                            Estimated blood loss was minimal.  A 10 mm polyp was found in the cecum adjacent to                            the appendiceal orifice. The polyp was flat and                            serpiginous. The polyp was removed with a piecemeal                            technique using a cold snare. Resection and                            retrieval were complete. Estimated blood loss was                            minimal.                           The exam was otherwise without abnormality on                            direct and retroflexion views. Complications:            No immediate complications. Estimated blood loss:                            Minimal. Estimated Blood Loss:     Estimated blood loss was minimal. Impression:               - Non-bleeding internal hemorrhoids.                           - Melanosis in the colon.                           - Three less than 1 mm polyps in the sigmoid colon,                            removed with a cold biopsy forceps. Resected and                            retrieved.                           - Two 1 to 2 mm polyps in the descending colon,                            removed with a cold snare. Resected and retrieved.                           - One 3 mm polyp in the ascending colon, removed                            with a cold snare. Resected and retrieved.                           -  One 10 mm polyp in the cecum at the appendiceal                            orifice, removed piecemeal using a cold snare.                            Resected and retrieved.                           - The examination was otherwise normal on direct                             and retroflexion views. Recommendation:           - Patient has a contact number available for                            emergencies. The signs and symptoms of potential                            delayed complications were discussed with the                            patient. Return to normal activities tomorrow.                            Written discharge instructions were provided to the                            patient.                           - Resume previous diet.                           - Continue present medications.                           - Await pathology results.                           - Repeat colonoscopy in 3 years for surveillance.                           - Emerging evidence supports eating a diet of                            fruits, vegetables, grains, calcium, and yogurt                            while reducing red meat and alcohol may reduce the                            risk of colon cancer.                           -  Thank you for allowing me to be involved in your                            colon cancer prevention. Thornton Park MD, MD 12/18/2019 12:08:15 PM This report has been signed electronically.

## 2019-12-18 NOTE — Telephone Encounter (Signed)
Do you mind contacting Ms. Pine to find out what her concerns were regarding the brace and her medications?   Thanks,  Phill Myron, D.O. Primary Care at [facility]  12/18/2019, 10:20 AM

## 2019-12-19 ENCOUNTER — Telehealth: Payer: Self-pay | Admitting: Gastroenterology

## 2019-12-19 NOTE — Telephone Encounter (Signed)
ROI fax to [facility] for patient records.

## 2019-12-20 ENCOUNTER — Encounter: Payer: Self-pay | Admitting: Physical Therapy

## 2019-12-20 ENCOUNTER — Telehealth: Payer: Self-pay

## 2019-12-20 ENCOUNTER — Telehealth: Payer: Self-pay | Admitting: Internal Medicine

## 2019-12-20 ENCOUNTER — Ambulatory Visit: Payer: Medicare Other | Admitting: Physical Therapy

## 2019-12-20 ENCOUNTER — Other Ambulatory Visit: Payer: Self-pay

## 2019-12-20 ENCOUNTER — Other Ambulatory Visit: Payer: Self-pay | Admitting: Internal Medicine

## 2019-12-20 DIAGNOSIS — R2689 Other abnormalities of gait and mobility: Secondary | ICD-10-CM

## 2019-12-20 DIAGNOSIS — M21371 Foot drop, right foot: Secondary | ICD-10-CM

## 2019-12-20 DIAGNOSIS — R2681 Unsteadiness on feet: Secondary | ICD-10-CM

## 2019-12-20 DIAGNOSIS — M5412 Radiculopathy, cervical region: Secondary | ICD-10-CM

## 2019-12-20 DIAGNOSIS — M6281 Muscle weakness (generalized): Secondary | ICD-10-CM

## 2019-12-20 MED ORDER — TIZANIDINE HCL 4 MG PO TABS: 4.0000 mg | ORAL_TABLET | Freq: Three times a day (TID) | ORAL | 3 refills | Status: DC | PRN

## 2019-12-20 NOTE — Telephone Encounter (Signed)
1) Medication(s) Requested (by name): tiZANidine (ZANAFLEX) 4 MG tablet [id number]  azaTHIOprine (IMURAN) 50 MG tablet [id number]  topiramate (TOPAMAX) 50 MG tablet [id number]   2) Pharmacy of Choice:  [id number] - [place], [address] [place] [address]    Approved medications will be sent to pharmacy, we will reach out to you if there is an issue.  Requests made after 3pm may not be addressed until following business day!

## 2019-12-20 NOTE — Therapy (Signed)
[facility] [address] [place], [place], [address] Phone: [phone]   Fax:  [phone]  Physical Therapy Treatment  Patient Details  Name: Brittney Maddox MRN: [id number] Date of Birth: 1966-08-02 Referring Provider (PT): Charlott Rakes, MD   Encounter Date: 12/20/2019  PT End of Session - 12/20/19 0942    Visit Number  13    Number of Visits  22    Date for PT Re-Evaluation  86/76/72   per recert 0/94/7096   Authorization Type  MCR: KX mod at 15th visit, progress note at 10th visit    Progress Note Due on Visit  20    PT Start Time  0935   pt running behind   PT Stop Time  1015    PT Time Calculation (min)  40 min    Equipment Utilized During Treatment  --    Activity Tolerance  Patient tolerated treatment well   Pt rates pain decreased to 7/10 after session completed   Behavior During Therapy  [facility] for tasks assessed/performed       Past Medical History:  Diagnosis Date  . Allergy   . Anemia   . Asthma   . Blood transfusion without reported diagnosis   . DVT (deep venous thrombosis) ([place])    2020- no blood thinners  . Lupus ([place])   . Myocardial infarction ([place])    "slight" per pt- 2014  . Rheumatoid arthritis ([place])   . Trigeminal neuralgia     Past Surgical History:  Procedure Laterality Date  . ABDOMINAL HYSTERECTOMY    . CESAREAN SECTION    . COLONOSCOPY     within 2 years- per pt "had a lot of polyps"  . NERVE AND TENDON REPAIR Right    right hand  . ROTATOR CUFF REPAIR Left   . STOMACH SURGERY     gastric sleeve    There were no vitals filed for this visit.  Subjective Assessment - 12/20/19 0939    Subjective  No falls. States no pain today. Had colonoscopy the other day, so rested yesterday. HEP is going okay. Has her compression socks on today.    Limitations  Standing;Lifting;Walking    How long can you sit comfortably?  10 min    How long can you stand comfortably?  2-3 min    How long can you walk comfortably?  2-3 min    Diagnostic tests  07/01/2019 Impression: These findings are consistent with all osteoarthritis and mild chondromalacia patella.          [facility] Adult PT Treatment/Exercise - 12/20/19 0944      Transfers   Transfers  Sit to Stand;Stand to Sit    Sit to Stand  5: Supervision;With upper extremity assist;From bed;From chair/3-in-1    Stand to Sit  5: Supervision;With upper extremity assist;To bed;To chair/3-in-1      Ambulation/Gait   Ambulation/Gait  Yes    Ambulation/Gait Assistance  4: Min guard    Ambulation/Gait Assistance Details  cues to stay closer to RW and for increased step length    Ambulation Distance (Feet)  80 Feet   x2   Assistive device  Rolling walker    Gait Pattern  Step-to pattern;Decreased stride length;Trendelenburg;Antalgic;Trunk flexed;Shuffle;Narrow base of support;Decreased hip/knee flexion - right;Decreased dorsiflexion - right;Step-through pattern;Poor foot clearance - right    Ambulation Surface  Level;Indoor      Self-Care   Self-Care  Other Self-Care Comments    Other Self-Care Comments  reviewed fall prevention strategies with patient. Issued handout with these strategies today.       Exercises   Exercises  Other Exercises    Other Exercises   reviewed with pt performing current HEP. advanced heel/toe raises to standing, upgraded pt's resistance band to red with seated clamshells, advanced SAQ's to seated LAQ's and removed supine clamshell as she is doing this in sitting as well. No issues reported or noted with performance in session. cues needed at times on correct ex form/technique.        Access Code: [email or web address] URL: [email or web address] Date: 12/20/2019 Prepared by: Willow Ora  Exercises Seated March - 1 x daily - 7 x weekly - 10 reps - 2 sets Seated Gluteal Sets - 1-2 x daily - 7 x weekly - 10 reps - 2 sets - 3 sec hold Seated Knee Extension with Resistance - 1 x daily - 5 x weekly  - 1 sets - 10 reps Seated Hip Abduction - 2 x daily - 7 x weekly - 10 reps - 2 sets Sit to stand in stride stance - 1-2 x daily - 5 x weekly - 10 reps - 1 sets Supine Bridge - 1 x daily - 7 x weekly - 10 reps - 2 sets Heel Toe Raises with Counter Support - 1 x daily - 5 x weekly - 1 sets - 10 reps       PT Education - 12/20/19 0946    Education Details  fall prevention strategies for home; updated/advanced HEP.    Person(s) Educated  Patient    Methods  Explanation;Demonstration;Handout    Comprehension  Verbalized understanding;Returned demonstration       PT Short Term Goals - 12/20/19 0943      PT SHORT TERM GOAL #1   Title  Pt will verbalize understanding of fall prevention in home environment.  TARGET 12/20/2019    Time  4    Period  Weeks    Status  New      PT SHORT TERM GOAL #2   Title  Pt will be independent with revisions/updates to HEP for strength, balance, gait.    Baseline  12/20/19: met with current HEP    Status  Achieved        PT Long Term Goals - 11/21/19 2029      PT LONG TERM GOAL #1   Title  Pt will independently don/doff and verbalize proper wear/care of AFO.  TARGET 01/03/2020    Time  6    Period  Weeks    Status  New      PT LONG TERM GOAL #2   Title  pt to be able to walk/ stand >/=30 min and navigate u/dwon >/= 6 steps with LRAD for functional endurance and safety for in home/ community ambulation    Baseline  per patient report 11/20/2019 15 minutes    Time  6    Period  Weeks    Status  On-going      PT LONG TERM GOAL #3   Title  pt to be I with all HEP given as of last visit to maintain and progress current level of function    Time  6    Period  Weeks    Status  On-going      PT LONG TERM GOAL #4   Title  increase Berg balance to >/= 28/56 to demo improvment in balance and stability    Baseline  21/56 11/20/19  Time  6    Period  Weeks    Status  Revised            Plan - 12/20/19 0942    Clinical Impression Statement   Today's skilled session focused on progress toward STGs with both goals met today. Provided education on fall prevention strategies with handout given today. Also reviewed and advanced pt's HEP. The pt is progressing toward goals and should benefit from continued PT to progress toward unmet LTGs.    Examination-Activity Limitations  Stand;Stairs;Squat;Locomotion Level;Bend;Bathing    PT Frequency  2x / week    PT Duration  6 weeks   per recert 3/75/4360   PT Treatment/Interventions  ADLs/Self Care Home Management;Cryotherapy;Electrical Stimulation;Iontophoresis 76m/ml Dexamethasone;Moist Heat;Ultrasound;Gait training;Stair training;Functional mobility training;Therapeutic activities;Therapeutic exercise;Balance training;Neuromuscular re-education;Manual techniques;Passive range of motion;Taping;Dry needling    PT Next Visit Plan  continue with Trial of SpryStep Max (full anterior portion of AFO) on RLE until [employer] get pt her own    PT [place] Agree with Plan of Care  Patient       Patient will benefit from skilled therapeutic intervention in order to improve the following deficits and impairments:  Abnormal gait, Pain, Improper body mechanics, Postural dysfunction, Decreased strength, Difficulty walking, Decreased balance, Decreased activity tolerance, Decreased coordination, Decreased endurance, Decreased range of motion, Decreased mobility  Visit Diagnosis: Other abnormalities of gait and mobility  Unsteadiness on feet  Muscle weakness (generalized)  Right foot drop     Problem List Patient Active Problem List   Diagnosis Date Noted  . Cervicalgia 06/11/2019  . Right trigeminal neuralgia 06/11/2019  . Prediabetes 08/20/2013  . Elevated LFTs 08/20/2013  . Fatty liver 08/20/2013  . Vaginitis and vulvovaginitis 07/12/2013  . 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 Lupus (systemic lupus erythematosus) ([place] 06/24/2013    KWillow Ora PTA, CPocon[place] [place] [phone]/09/21, 3:14 PM   Name: Brittney THORINGTONMRN: [id number] of Birth: [birth year]

## 2019-12-20 NOTE — Telephone Encounter (Signed)
  Follow up Call-  Call back number 12/18/2019  Post procedure Call Back phone  # [phone] cell  Permission to leave phone message Yes  Some recent data might be hidden     Patient questions:  Do you have a fever, pain , or abdominal swelling? No. Pain Score  0 *  Have you tolerated food without any problems? Yes.    Have you been able to return to your normal activities? Yes.    Do you have any questions about your discharge instructions: Diet   No. Medications  No. Follow up visit  No.  Do you have questions or concerns about your Care? No.  Actions: * If pain score is 4 or above: No action needed, pain <4.  1. Have you developed a fever since your procedure? No  2.   Have you had an respiratory symptoms (SOB or cough) since your procedure?No  3.   Have you tested positive for COVID 19 since your procedure No  4.   Have you had any family members/close contacts diagnosed with the COVID 19 since your procedure?  No   If yes to any of these questions please route to Joylene John, RN and Erenest Rasher, RN

## 2019-12-20 NOTE — Telephone Encounter (Signed)
Patient needs Dr Juleen China to contact [facility] to confirm the order and to have it rushed as she needs it soon.

## 2019-12-20 NOTE — Patient Instructions (Addendum)
Fall Prevention in the Home, Adult Falls can cause injuries and can affect people from all age groups. There are many simple things that you can do to make your home safe and to help prevent falls. Ask for help when making these changes, if needed. What actions can I take to prevent falls? General instructions  Use good lighting in all rooms. Replace any light bulbs that burn out.  Turn on lights if it is dark. Use night-lights.  Place frequently used items in easy-to-reach places. Lower the shelves around your home if necessary.  Set up furniture so that there are clear paths around it. Avoid moving your furniture around.  Remove throw rugs and other tripping hazards from the floor.  Avoid walking on wet floors.  Fix any uneven floor surfaces.  Add color or contrast paint or tape to grab bars and handrails in your home. Place contrasting color strips on the first and last steps of stairways.  When you use a stepladder, make sure that it is completely opened and that the sides are firmly locked. Have someone hold the ladder while you are using it. Do not climb a closed stepladder.  Be aware of any and all pets. What can I do in the bathroom?      Keep the floor dry. Immediately clean up any water that spills onto the floor.  Remove soap buildup in the tub or shower on a regular basis.  Use non-skid mats or decals on the floor of the tub or shower.  Attach bath mats securely with double-sided, non-slip rug tape.  If you need to sit down while you are in the shower, use a plastic, non-slip stool.  Install grab bars by the toilet and in the tub and shower. Do not use towel bars as grab bars. What can I do in the bedroom?  Make sure that a bedside light is easy to reach.  Do not use oversized bedding that drapes onto the floor.  Have a firm chair that has side arms to use for getting dressed. What can I do in the kitchen?  Clean up any spills right away.  If you need to  reach for something above you, use a sturdy step stool that has a grab bar.  Keep electrical cables out of the way.  Do not use floor polish or wax that makes floors slippery. If you must use wax, make sure that it is non-skid floor wax. What can I do in the stairways?  Do not leave any items on the stairs.  Make sure that you have a light switch at the top of the stairs and the bottom of the stairs. Have them installed if you do not have them.  Make sure that there are handrails on both sides of the stairs. Fix handrails that are broken or loose. Make sure that handrails are as long as the stairways.  Install non-slip stair treads on all stairs in your home.  Avoid having throw rugs at the top or bottom of stairways, or secure the rugs with carpet tape to prevent them from moving.  Choose a carpet design that does not hide the edge of steps on the stairway.  Check any carpeting to make sure that it is firmly attached to the stairs. Fix any carpet that is loose or worn. What can I do on the outside of my home?  Use bright outdoor lighting.  Regularly repair the edges of walkways and driveways and fix any cracks.    Remove high doorway thresholds.  Trim any shrubbery on the main path into your home.  Regularly check that handrails are securely fastened and in good repair. Both sides of any steps should have handrails.  Install guardrails along the edges of any raised decks or porches.  Clear walkways of debris and clutter, including tools and rocks.  Have leaves, snow, and ice cleared regularly.  Use sand or salt on walkways during winter months.  In the garage, clean up any spills right away, including grease or oil spills. What other actions can I take?  Wear closed-toe shoes that fit well and support your feet. Wear shoes that have rubber soles or low heels.  Use mobility aids as needed, such as canes, walkers, scooters, and crutches.  Review your medicines with your  health care provider. Some medicines can cause dizziness or changes in blood pressure, which increase your risk of falling. Talk with your health care provider about other ways that you can decrease your risk of falls. This may include working with a physical therapist or trainer to improve your strength, balance, and endurance. Where to find more information  Centers for Disease Control and Prevention, [employer]AD[employer]: WebmailGuide.co.za  [employer] on Aging: BrainJudge.co.uk Contact a health care provider if:  You are afraid of falling at home.  You feel weak, drowsy, or dizzy at home.  You fall at home. Summary  There are many simple things that you can do to make your home safe and to help prevent falls.  Ways to make your home safe include removing tripping hazards and installing grab bars in the bathroom.  Ask for help when making these changes in your home. This information is not intended to replace advice given to you by your health care provider. Make sure you discuss any questions you have with your health care provider. Document Revised: 08/11/2017 Document Reviewed: 04/13/2017 Elsevier Patient Education  2020 [place].   Access Code: [email or web address] URL: [email or web address] Date: 12/20/2019 Prepared by: Willow Ora  Exercises Seated March - 1 x daily - 7 x weekly - 10 reps - 2 sets Seated Gluteal Sets - 1-2 x daily - 7 x weekly - 10 reps - 2 sets - 3 sec hold Seated Knee Extension with Resistance - 1 x daily - 5 x weekly - 1 sets - 10 reps Seated Hip Abduction - 2 x daily - 7 x weekly - 10 reps - 2 sets Sit to stand in stride stance - 1-2 x daily - 5 x weekly - 10 reps - 1 sets Supine Bridge - 1 x daily - 7 x weekly - 10 reps - 2 sets Heel Toe Raises with Counter Support - 1 x daily - 5 x weekly - 1 sets - 10 reps

## 2019-12-20 NOTE — Telephone Encounter (Signed)
error 

## 2019-12-20 NOTE — Telephone Encounter (Signed)
Per Meghan at [facility], Dr. Juleen China does not need to contact the office.  Patient needs an appointment with Dr. Juleen China for insurance purposes & to get a Rx for the brace.   Please schedule appointment.

## 2019-12-20 NOTE — Telephone Encounter (Signed)
Zanaflex sent to provider for refill.  Topamax was written on 12/12/2019, #60 with 2 refills.  Imuran is a rheumatoid arthritis medication & she needs to contact her Rheumatologist for refills.

## 2019-12-23 ENCOUNTER — Telehealth: Payer: Self-pay | Admitting: Physical Therapy

## 2019-12-23 ENCOUNTER — Ambulatory Visit: Payer: Medicare Other | Admitting: Physical Therapy

## 2019-12-23 ENCOUNTER — Other Ambulatory Visit: Payer: Self-pay

## 2019-12-23 DIAGNOSIS — R2689 Other abnormalities of gait and mobility: Secondary | ICD-10-CM | POA: Diagnosis not present

## 2019-12-23 DIAGNOSIS — R2681 Unsteadiness on feet: Secondary | ICD-10-CM

## 2019-12-23 DIAGNOSIS — M6281 Muscle weakness (generalized): Secondary | ICD-10-CM

## 2019-12-23 NOTE — Therapy (Signed)
[facility] [address] [place], [place], [address] Phone: [phone]   Fax:  [phone]  Physical Therapy Treatment  Patient Details  Name: Brittney Maddox MRN: [id number] Date of Birth: January 05, 1966 Referring Provider (PT): Charlott Rakes, MD   Encounter Date: 12/23/2019  PT End of Session - 12/23/19 1516    Visit Number  14    Number of Visits  22    Date for PT Re-Evaluation  18/48/59   per recert 2/76/3943   Authorization Type  MCR: KX mod at 15th visit, progress note at 10th visit    Progress Note Due on Visit  20    PT Start Time  1318    PT Stop Time  1402    PT Time Calculation (min)  44 min    Equipment Utilized During Treatment  Gait belt   use of SpryStep clinic AFO   Activity Tolerance  Patient tolerated treatment well    Behavior During Therapy  [facility] for tasks assessed/performed       Past Medical History:  Diagnosis Date  . Allergy   . Anemia   . Asthma   . Blood transfusion without reported diagnosis   . DVT (deep venous thrombosis) ([place])    2020- no blood thinners  . Lupus ([place])   . Myocardial infarction ([place])    "slight" per pt- 2014  . Rheumatoid arthritis ([place])   . Trigeminal neuralgia     Past Surgical History:  Procedure Laterality Date  . ABDOMINAL HYSTERECTOMY    . CESAREAN SECTION    . COLONOSCOPY     within 2 years- per pt "had a lot of polyps"  . NERVE AND TENDON REPAIR Right    right hand  . ROTATOR CUFF REPAIR Left   . STOMACH SURGERY     gastric sleeve    There were no vitals filed for this visit.  Subjective Assessment - 12/23/19 1325    Subjective  Got some new sneakers.  Forgot the compression stockings today.  Have been in contact with MD office about AFO and medications.    Limitations  Standing;Lifting;Walking    How long can you sit comfortably?  10 min    How long can you stand comfortably?  2-3 min    How long can you walk comfortably?  2-3 min    Diagnostic tests  07/01/2019 Impression: These findings are consistent with all osteoarthritis and mild chondromalacia patella.    Currently in Pain?  Yes    Pain Score  8     Pain Location  Ankle    Pain Orientation  Right    Pain Descriptors / Indicators  Constant;Throbbing    Pain Type  Chronic pain    Pain Onset  More than a month ago    Pain Frequency  Constant    Aggravating Factors   probably did too much with moving    Pain Relieving Factors  cream                       [facility] Adult PT Treatment/Exercise - 12/23/19 0001      Ambulation/Gait   Ambulation/Gait  Yes    Ambulation/Gait Assistance  4: Min guard    Ambulation/Gait Assistance Details  Cues to keep feet within base of RW (not in front of RW); cues for initial RLE hip/knee flexion to help initiate swing phase    Ambulation Distance (Feet)  200 Feet  20 ft x 4 reps, 100 ft x 2   Assistive device  Rolling walker   wearing SpryStep Anterior AFO RLE   Gait Pattern  Step-to pattern;Decreased stride length;Trendelenburg;Antalgic;Trunk flexed;Shuffle;Narrow base of support;Decreased hip/knee flexion - right;Decreased dorsiflexion - right;Step-through pattern;Poor foot clearance - right    Ambulation Surface  Level;Indoor    Gait Comments  Longer distance (200 ft) of gait wearing SpryStep AFO RLE.  Pt wearing new shoes today, but they are very flexible, low-profile sneakers, with little lateral support due to stretchy material.  Advised pt these may not be the most ideal shoes, because of less laces (more slip-on type) and the flexible material.  Pt requires cues for widenening BOS, as pt is almost scissoring with gait at times.      High Level Balance   High Level Balance Comments  Standing at outside of parallel bars:  alternating step taps to 2" step, x 5 reps, then side step together/R and L x 5 reps; performed without AFO with decreased RLE foot clearance; then performed 2nd set with use of clinic AFO.   Improved stability in standing noted.  Also attempted standing with wide BOS head turns x 5 reps, then head nods x 3 reps (pt has episode of tremulous motion at neck with attempt at extension, then reports feeling dizzy).  Pt gets tearful and needs to sit.  Dizziness symptoms resolve after 1-2 minutes of sitting.      Self-Care   Self-Care  Other Self-Care Comments    Other Self-Care Comments   Reviewed fall prevention strategies from education last visit.   Pt able to verbalize understanding.  Pt wanting to replace "foot bunnies" on the posterior legs of RW with tennis balls; discussed needing to get the rubber stoppers on the legs prior to putting tennis balls on her RW legs.      Exercises   Other Exercises   Reviewed standing ankle dorsiflexion, plantarflexion, standing at outside of parallel bars, x 10 reps.  Cues to lessen posterior lean through hips.               PT Short Term Goals - 12/23/19 1521      PT SHORT TERM GOAL #1   Title  Pt will verbalize understanding of fall prevention in home environment.  TARGET 12/20/2019    Time  4    Period  Weeks    Status  Achieved      PT SHORT TERM GOAL #2   Title  Pt will be independent with revisions/updates to HEP for strength, balance, gait.    Baseline  12/20/19: met with current HEP    Status  Achieved        PT Long Term Goals - 11/21/19 2029      PT LONG TERM GOAL #1   Title  Pt will independently don/doff and verbalize proper wear/care of AFO.  TARGET 01/03/2020    Time  6    Period  Weeks    Status  New      PT LONG TERM GOAL #2   Title  pt to be able to walk/ stand >/=30 min and navigate u/dwon >/= 6 steps with LRAD for functional endurance and safety for in home/ community ambulation    Baseline  per patient report 11/20/2019 15 minutes    Time  6    Period  Weeks    Status  On-going      PT LONG TERM GOAL #3   Title  pt to be I with all HEP given as of last visit to maintain and progress current level of  function    Time  6    Period  Weeks    Status  On-going      PT LONG TERM GOAL #4   Title  increase Berg balance to >/= 28/56 to demo improvment in balance and stability    Baseline  21/56 11/20/19    Time  6    Period  Weeks    Status  Revised            Plan - 12/23/19 1517    Clinical Impression Statement  Reviewed fall prevention education provided last visit, with pt meeting STG for fall prevention.  Continued standing balance and gait training exercises with AFO today.  Pt's foot clearance and stability with gait improved with trial of AFO, but she continues to need cues for hip/knee flexion as well as to avoid near scissoring pattern with gait.  Will continue to benefit from skilled PT for strengthening and standing balance, gait trianing for improved functional mobility.    Examination-Activity Limitations  Stand;Stairs;Squat;Locomotion Level;Bend;Bathing    PT Frequency  2x / week    PT Duration  6 weeks   per recert 3/46/2194   PT Treatment/Interventions  ADLs/Self Care Home Management;Cryotherapy;Electrical Stimulation;Iontophoresis 18m/ml Dexamethasone;Moist Heat;Ultrasound;Gait training;Stair training;Functional mobility training;Therapeutic activities;Therapeutic exercise;Balance training;Neuromuscular re-education;Manual techniques;Passive range of motion;Taping;Dry needling    PT Next Visit Plan  continue with Trial of SpryStep Max (full anterior portion of AFO) on RLE until [employer] get pt her own; standing balance exercises; work torwards LTGs    PT [place] Agree with Plan of Care  Patient       Patient will benefit from skilled therapeutic intervention in order to improve the following deficits and impairments:  Abnormal gait, Pain, Improper body mechanics, Postural dysfunction, Decreased strength, Difficulty walking, Decreased balance, Decreased activity tolerance, Decreased coordination, Decreased endurance, Decreased range of  motion, Decreased mobility  Visit Diagnosis: Other abnormalities of gait and mobility  Unsteadiness on feet  Muscle weakness (generalized)     Problem List Patient Active Problem List   Diagnosis Date Noted  . Cervicalgia 06/11/2019  . Right trigeminal neuralgia 06/11/2019  . Prediabetes 08/20/2013  . Elevated LFTs 08/20/2013  . Fatty liver 08/20/2013  . Vaginitis and vulvovaginitis 07/12/2013  . 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 Lupus (systemic lupus erythematosus) ([place] 06/24/2013    Brittney Maddox W. 12/23/2019, 3:23 PM  MFrazier Maddox, PT   [place]SH[place] LakeGHarrison N[place]aska [address]Phone: [phone]  Fax:  [phone] Name: FMARCELYN RUPPEMRN: [id number] of Birth: [birth year]

## 2019-12-23 NOTE — Telephone Encounter (Signed)
Dr. Juleen China, I wanted to clarify what is needed in order for [facility] to get and AFO for Ms. Ronnald Ramp.  You have already placed an order on the chart for and AFO, and that has been given to [facility].    However, what is needed now is either 1)Ms. Kanner to schedule a face to face visit with you for you to document her gait pattern, her strength deficits, her diagnosis in relation to why she needs an AFO, how she will benefit from an AFO; or 2)if you felt this information was discussed or noted in her previous visit to you when you wrote the order, could you addend that previous visit note to include the above information?  I apologize for the confusion, and I appreciate your support of the patient in this process.  Please let me know if you have any further questions.  Thank you.  Mady Haagensen, PT 12/23/19 3:29 PM Phone: [phone] Fax: [phone]

## 2019-12-24 ENCOUNTER — Encounter: Payer: Self-pay | Admitting: Gastroenterology

## 2019-12-24 NOTE — Telephone Encounter (Signed)
Please schedule an appointment as noted during phone encounter from 12/20/2019 after I called [facility] to clarify what was needed for patient to get her brace.

## 2019-12-24 NOTE — Telephone Encounter (Signed)
Please schedule an appointment.

## 2019-12-24 NOTE — Telephone Encounter (Signed)
Hi Brittney Maddox,   Can we schedule a visit for Brittney Maddox per PT request for evaluation for AFO? Thanks!   Phill Myron, D.O. Primary Care at [facility]  12/24/2019, 8:55 AM

## 2019-12-26 ENCOUNTER — Encounter: Payer: Self-pay | Admitting: Physical Therapy

## 2019-12-26 ENCOUNTER — Other Ambulatory Visit: Payer: Self-pay

## 2019-12-26 ENCOUNTER — Ambulatory Visit: Payer: Medicare Other | Admitting: Physical Therapy

## 2019-12-26 DIAGNOSIS — R2689 Other abnormalities of gait and mobility: Secondary | ICD-10-CM

## 2019-12-26 DIAGNOSIS — R2681 Unsteadiness on feet: Secondary | ICD-10-CM

## 2019-12-26 NOTE — Therapy (Signed)
[facility] [address] [place], [place], [address] Phone: [phone]   Fax:  [phone]  Physical Therapy Treatment  Patient Details  Name: Brittney Maddox MRN: [id number] Date of Birth: 1966/07/01 Referring Provider (PT): Charlott Rakes, MD   Encounter Date: 12/26/2019  PT End of Session - 12/26/19 1728    Visit Number  15    Number of Visits  22    Date for PT Re-Evaluation  56/31/49   per recert 03/13/6377   Authorization Type  MCR: KX mod at 15th visit, progress note at 10th visit    Progress Note Due on Visit  20    PT Start Time  1451    PT Stop Time  1530    PT Time Calculation (min)  39 min    Equipment Utilized During Treatment  Gait belt   use of Sp[facility] clinic AFO   Activity Tolerance  Patient tolerated treatment well    Behavior During Therapy  [facility] for tasks assessed/performed       Past Medical History:  Diagnosis Date  . Allergy   . Anemia   . Asthma   . Blood transfusion without reported diagnosis   . DVT (deep venous thrombosis) ([place])    2020- no blood thinners  . Lupus ([place])   . Myocardial infarction ([place])    "slight" per pt- 2014  . Rheumatoid arthritis ([place])   . Trigeminal neuralgia     Past Surgical History:  Procedure Laterality Date  . ABDOMINAL HYSTERECTOMY    . CESAREAN SECTION    . COLONOSCOPY     within 2 years- per pt "had a lot of polyps"  . NERVE AND TENDON REPAIR Right    right hand  . ROTATOR CUFF REPAIR Left   . STOMACH SURGERY     gastric sleeve    There were no vitals filed for this visit.  Subjective Assessment - 12/26/19 1454    Subjective  Have an appointment with Dr. Juleen China on 4/21, and then [facility] called and said the brace was ready.    Limitations  Standing;Lifting;Walking    How long can you sit comfortably?  10 min    How long can you stand comfortably?  2-3 min    How long can you walk comfortably?  2-3 min    Diagnostic tests  07/01/2019  Impression: These findings are consistent with all osteoarthritis and mild chondromalacia patella.    Patient Stated Goals  improve balance, decrease pain    Currently in Pain?  No/denies    Pain Onset  More than a month ago                       [facility] Adult PT Treatment/Exercise - 12/26/19 0001      Ambulation/Gait   Ambulation/Gait  Yes    Ambulation/Gait Assistance  4: Min guard    Ambulation/Gait Assistance Details  Initial gait without AFO, with pt having difficulty fully placing LE foot flat during stance, creating knee-buckling sensation.  PT provides tactile cues, but pt appears that knee may buckle.  Tried to also provide tactile and verbal cues to relax through shoulders.  Further bouts of gait with trial AFO in clinic.  Pt starts off with better gait pattern, but with fatigue, she continues to have knee flexion on RLE during stance and heavily pushes through RW.    Ambulation Distance (Feet)  120 Feet   50 ft x 2,  additional 50 ft x 2; 20 ft x 2   Assistive device  Rolling walker    Gait Pattern  Step-to pattern;Decreased stride length;Trendelenburg;Antalgic;Trunk flexed;Shuffle;Narrow base of support;Decreased hip/knee flexion - right;Decreased dorsiflexion - right;Step-through pattern;Poor foot clearance - right    Ambulation Surface  Level;Indoor    Pre-Gait Activities  Per pt request, placed rubber stoppers and tennis balls on back legs of her RW for improved ease of maneuvering.    Gait Comments  After first bout of 120 ft of gait, PT places clinic trial AFO on RLE to assist with stability and foot clearance.  Pt's gait pattern unpredictable, at times needing cues for R foot clearaance/hip and knee flexion; cues for knee extension dueing stance phase, narrowed BOS with internal rotation, needing cues for widened BOS.  PT provides consistent min guard assistance due to pt's unpredictable gait pattern.      High Level Balance   High Level Balance Activities  Other  (comment)    High Level Balance Comments  Standing at counter:  sidestep and weightshift, alternating step taps to 4" cabinet shelf, lateral weightshifting, sidestepping.  Pt needs seated rest breaks, as she begins to have increased forward lean through UEs on counter.     Also at counter, RLE as stance with L side step taps x 5 reps Attempted RLE forward step taps, cues for heelstrike (decreased foot clearance), then RLE forward/back kicks (decreased foot clearance)          PT Short Term Goals - 12/23/19 1521      PT SHORT TERM GOAL #1   Title  Pt will verbalize understanding of fall prevention in home environment.  TARGET 12/20/2019    Time  4    Period  Weeks    Status  Achieved      PT SHORT TERM GOAL #2   Title  Pt will be independent with revisions/updates to HEP for strength, balance, gait.    Baseline  12/20/19: met with current HEP    Status  Achieved        PT Long Term Goals - 11/21/19 2029      PT LONG TERM GOAL #1   Title  Pt will independently don/doff and verbalize proper wear/care of AFO.  TARGET 01/03/2020    Time  6    Period  Weeks    Status  New      PT LONG TERM GOAL #2   Title  pt to be able to walk/ stand >/=30 min and navigate u/dwon >/= 6 steps with LRAD for functional endurance and safety for in home/ community ambulation    Baseline  per patient report 11/20/2019 15 minutes    Time  6    Period  Weeks    Status  On-going      PT LONG TERM GOAL #3   Title  pt to be I with all HEP given as of last visit to maintain and progress current level of function    Time  6    Period  Weeks    Status  On-going      PT LONG TERM GOAL #4   Title  increase Berg balance to >/= 28/56 to demo improvment in balance and stability    Baseline  21/56 11/20/19    Time  6    Period  Weeks    Status  Revised            Plan - 12/26/19 1728  Clinical Impression Statement  Continued gait training without, then with trial AFO.  Pt needs consistent min  guard assistance, as her gait pattern is unpredictable-unsure if due to fatigue or pain.  Per pt report, she has MD face to face visit set up for next week and then orthotist appointment for later next week.  Will continue to benefit from skilled PT to address gait and stregnth, balance for improved mobility and safety with gait.    Examination-Activity Limitations  Stand;Stairs;Squat;Locomotion Level;Bend;Bathing    PT Frequency  2x / week    PT Duration  6 weeks   per recert 02/29/5092   PT Treatment/Interventions  ADLs/Self Care Home Management;Cryotherapy;Electrical Stimulation;Iontophoresis 47m/ml Dexamethasone;Moist Heat;Ultrasound;Gait training;Stair training;Functional mobility training;Therapeutic activities;Therapeutic exercise;Balance training;Neuromuscular re-education;Manual techniques;Passive range of motion;Taping;Dry needling    PT Next Visit Plan  Continue to AFO trial until pt receives hers from [facility] work on lower extremity strength, balance, gait (R hip/knee flexion; R knee extension); work towards [place]    PT Home Exercise Plan  [id number]    Consulted and Agree with Plan of Care  Patient       Patient will benefit from skilled therapeutic intervention in order to improve the following deficits and impairments:  Abnormal gait, Pain, Improper body mechanics, Postural dysfunction, Decreased strength, Difficulty walking, Decreased balance, Decreased activity tolerance, Decreased coordination, Decreased endurance, Decreased range of motion, Decreased mobility  Visit Diagnosis: Other abnormalities of gait and mobility  Unsteadiness on feet     Problem List Patient Active Problem List   Diagnosis Date Noted  . Cervicalgia 06/11/2019  . Right trigeminal neuralgia 06/11/2019  . Prediabetes 08/20/2013  . Elevated LFTs 08/20/2013  . Fatty liver 08/20/2013  . Vaginitis and vulvovaginitis 07/12/2013  . 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 Lupus (systemic lupus erythematosus) ([place]  06/24/2013    Brittney Buster W. 12/26/2019, 5:32 PM  MFrazier Butt, PT   [place] [address]Phone: [phone]  Fax:  [phone] Name: Brittney DAVLINMRN: [id number] of Birth: 4January 02, 1967

## 2019-12-27 ENCOUNTER — Other Ambulatory Visit: Payer: Self-pay | Admitting: Internal Medicine

## 2019-12-27 DIAGNOSIS — R928 Other abnormal and inconclusive findings on diagnostic imaging of breast: Secondary | ICD-10-CM

## 2019-12-30 ENCOUNTER — Ambulatory Visit: Payer: Medicare Other | Admitting: Physical Therapy

## 2019-12-30 ENCOUNTER — Other Ambulatory Visit: Payer: Self-pay

## 2020-01-01 ENCOUNTER — Other Ambulatory Visit: Payer: Self-pay

## 2020-01-01 ENCOUNTER — Encounter: Payer: Self-pay | Admitting: Internal Medicine

## 2020-01-01 ENCOUNTER — Ambulatory Visit (INDEPENDENT_AMBULATORY_CARE_PROVIDER_SITE_OTHER): Payer: Medicare Other | Admitting: Internal Medicine

## 2020-01-01 VITALS — BP 127/79 | HR 70 | Temp 97.3°F | Resp 17 | Ht 61.0 in | Wt 147.0 lb

## 2020-01-01 DIAGNOSIS — M6281 Muscle weakness (generalized): Secondary | ICD-10-CM

## 2020-01-01 DIAGNOSIS — R413 Other amnesia: Secondary | ICD-10-CM

## 2020-01-01 DIAGNOSIS — R479 Unspecified speech disturbances: Secondary | ICD-10-CM

## 2020-01-01 DIAGNOSIS — M21371 Foot drop, right foot: Secondary | ICD-10-CM

## 2020-01-01 DIAGNOSIS — R2681 Unsteadiness on feet: Secondary | ICD-10-CM

## 2020-01-01 DIAGNOSIS — R2689 Other abnormalities of gait and mobility: Secondary | ICD-10-CM | POA: Diagnosis not present

## 2020-01-01 DIAGNOSIS — R4701 Aphasia: Secondary | ICD-10-CM

## 2020-01-01 MED ORDER — MISC. DEVICES MISC: 0 refills | Status: AC

## 2020-01-01 NOTE — Progress Notes (Signed)
  Subjective:    Brittney Maddox - 54 y.o. female MRN [id number]  Date of birth: 09/18/1965  HPI  Brittney Maddox is here for Rx for AFO. Patient has a right foot drop and subsequent gait abnormality. She is followed by PT. AFO deemed to be beneficial on their analysis. Ambulates with a walker currently.   Also has concerns about memory loss and speech troubles. She can think about what she wants to say but then loses the words and can't express them. She has chronic occipital headaches that radiate to her neck. She is followed by neurology.      Health Maintenance:  There are no preventive care reminders to display for this patient.  -  reports that she has never smoked. She has never used smokeless tobacco. - Review of Systems: Per HPI. - Past Medical History: Patient Active Problem List   Diagnosis Date Noted  . Cervicalgia 06/11/2019  . Right trigeminal neuralgia 06/11/2019  . Prediabetes 08/20/2013  . Elevated LFTs 08/20/2013  . Fatty liver 08/20/2013  . Vaginitis and vulvovaginitis 07/12/2013  . 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 Lupus (systemic lupus erythematosus) ([place]) 06/24/2013   - Medications: reviewed and updated   Objective:   Physical Exam BP 127/79   Pulse 70   Temp (!) 97.3 F (36.3 C) (Temporal)   Resp 17   Ht 5\' 1"  (1.549 m)   Wt 147 lb (66.7 kg)   SpO2 97%   BMI 27.78 kg/m  Physical Exam  Neurological:  Right foot drop with ambulation. Unsteady gait. Ambulates with assistance of walker. Strength diminished in LE. Slow speech. Occasionally unable to express her thoughts and has trouble with word finding.            Assessment & Plan:   1. Other abnormalities of gait and mobility 2. Unsteadiness on feet 3. Muscle weakness (generalized) 4. Right foot drop Paperwork filled out for AFO from [facility]. Rx signed. Additionally, filled out handicap form for [employer].  - Misc. Devices MISC; Right AFO brace. diagnosis: Right foot drag  Dispense: 1 each; Refill:  0   5. Memory loss  6. Difficulty with speech 7. Aphasia Will obtain MRI brain given these complaints with speech changes and cognitive decline especially given reported history of headaches preceding these changes. Will also check labs for etiologies of these concerns. Differential remains broad and includes but is not limited to CVA, brain mass, complex migraines, dementia, vitamin or electrolyte abnormalities, neurosyphilis, thyroid abnormality.  - MR Brain W Wo Contrast; Future - TSH - Comprehensive metabolic panel - CBC - Vitamin B12 - RPR - Ammonia      Phill Myron, D.O. 01/01/2020, 3:33 PM Primary Care at [facility]

## 2020-01-02 ENCOUNTER — Ambulatory Visit: Payer: Medicare Other

## 2020-01-02 ENCOUNTER — Ambulatory Visit: Payer: Medicare Other | Admitting: Physical Therapy

## 2020-01-02 ENCOUNTER — Ambulatory Visit
Admission: RE | Admit: 2020-01-02 | Discharge: 2020-01-02 | Disposition: A | Discharge status: Home / Self Care | Payer: Medicare Other | Source: Ambulatory Visit | Attending: Internal Medicine | Admitting: Internal Medicine

## 2020-01-02 DIAGNOSIS — R928 Other abnormal and inconclusive findings on diagnostic imaging of breast: Secondary | ICD-10-CM

## 2020-01-02 LAB — COMPREHENSIVE METABOLIC PANEL
ALT: 18 IU/L (ref 0–32)
AST: 22 IU/L (ref 0–40)
Albumin/Globulin Ratio: 1.8 (ref 1.2–2.2)
Albumin: 4.5 g/dL (ref 3.8–4.9)
Alkaline Phosphatase: 115 IU/L (ref 39–117)
BUN/Creatinine Ratio: 16 (ref 9–23)
BUN: 12 mg/dL (ref 6–24)
Bilirubin Total: <0.2 mg/dL (ref 0.0–1.2)
CO2: 23 mmol/L (ref 20–29)
Calcium: 9.5 mg/dL (ref 8.7–10.2)
Chloride: 109 mmol/L — ABNORMAL HIGH (ref 96–106)
Creatinine, Ser: 0.76 mg/dL (ref 0.57–1.00)
GFR calc Af Amer: 101 mL/min/{1.73_m2} (ref >59)
GFR calc non Af Amer: 88 mL/min/{1.73_m2} (ref >59)
Globulin, Total: 2.5 g/dL (ref 1.5–4.5)
Glucose: 88 mg/dL (ref 65–99)
Potassium: 4.5 mmol/L (ref 3.5–5.2)
Sodium: 145 mmol/L — ABNORMAL HIGH (ref 134–144)
Total Protein: 7 g/dL (ref 6.0–8.5)

## 2020-01-02 LAB — CBC
Hematocrit: 39.1 % (ref 34.0–46.6)
Hemoglobin: 13.3 g/dL (ref 11.1–15.9)
MCH: 29.8 pg (ref 26.6–33.0)
MCHC: 34 g/dL (ref 31.5–35.7)
MCV: 88 fL (ref 79–97)
Platelets: 232 10*3/uL (ref 150–450)
RBC: 4.46 x10E6/uL (ref 3.77–5.28)
RDW: 13 % (ref 11.7–15.4)
WBC: 2.5 10*3/uL — CL (ref 3.4–10.8)

## 2020-01-02 LAB — AMMONIA: Ammonia: 39 ug/dL (ref 34–178)

## 2020-01-02 LAB — TSH: TSH: 0.825 u[IU]/mL (ref 0.450–4.500)

## 2020-01-02 LAB — VITAMIN B12: Vitamin B-12: 573 pg/mL (ref 232–1245)

## 2020-01-02 LAB — RPR: RPR Ser Ql: NONREACTIVE

## 2020-01-03 ENCOUNTER — Telehealth: Payer: Self-pay

## 2020-01-03 ENCOUNTER — Other Ambulatory Visit: Payer: Self-pay | Admitting: Family Medicine

## 2020-01-03 ENCOUNTER — Ambulatory Visit: Payer: Medicare Other | Admitting: Physical Therapy

## 2020-01-03 DIAGNOSIS — D708 Other neutropenia: Secondary | ICD-10-CM

## 2020-01-03 NOTE — Telephone Encounter (Signed)
Patient was called and a voicemail was left informing patient to return phone call for lab results. 

## 2020-01-03 NOTE — Telephone Encounter (Signed)
-----   Message from Charlott Rakes, MD sent at 01/03/2020  8:40 AM EDT ----- Can you please ensure she has reviewed the my chart message regarding her labs? Thanks

## 2020-01-03 NOTE — Telephone Encounter (Signed)
Patient returned phone call and was informed of lab results and referral.

## 2020-01-06 ENCOUNTER — Ambulatory Visit: Payer: Medicare Other | Admitting: Physical Therapy

## 2020-01-06 ENCOUNTER — Telehealth: Payer: Self-pay | Admitting: Internal Medicine

## 2020-01-06 ENCOUNTER — Other Ambulatory Visit: Payer: Self-pay

## 2020-01-06 ENCOUNTER — Other Ambulatory Visit: Payer: Self-pay | Admitting: Internal Medicine

## 2020-01-06 ENCOUNTER — Telehealth: Payer: Self-pay | Admitting: Hematology and Oncology

## 2020-01-06 DIAGNOSIS — R479 Unspecified speech disturbances: Secondary | ICD-10-CM

## 2020-01-06 DIAGNOSIS — R2681 Unsteadiness on feet: Secondary | ICD-10-CM

## 2020-01-06 DIAGNOSIS — R4701 Aphasia: Secondary | ICD-10-CM

## 2020-01-06 DIAGNOSIS — R2689 Other abnormalities of gait and mobility: Secondary | ICD-10-CM

## 2020-01-06 NOTE — Therapy (Signed)
[facility] [address] [place], [place], [address] Phone: [phone]   Fax:  [phone]  Physical Therapy Treatment  Patient Details  Name: Brittney Maddox MRN: [id number] Date of Birth: [birth year] Referring Provider (PT): Charlott Rakes, MD   Encounter Date: 01/06/2020  PT End of Session - 01/06/20 1544    Visit Number  16    Number of Visits  22    Date for PT Re-Evaluation  63/78/58   per recert 8/50/2774   Authorization Type  MCR: KX mod at 15th visit, progress note at 10th visit    Progress Note Due on Visit  20    PT Start Time  1403    PT Stop Time  1445    PT Time Calculation (min)  42 min    Equipment Utilized During Treatment  Gait belt   use of [facility] clinic AFO   Activity Tolerance  Patient tolerated treatment well    Behavior During Therapy  [facility] for tasks assessed/performed       Past Medical History:  Diagnosis Date  . Allergy   . Anemia   . Asthma   . Blood transfusion without reported diagnosis   . DVT (deep venous thrombosis) ([place])    2020- no blood thinners  . Lupus ([place])   . Myocardial infarction ([place])    "slight" per pt- 2014  . Rheumatoid arthritis ([place])   . Trigeminal neuralgia     Past Surgical History:  Procedure Laterality Date  . ABDOMINAL HYSTERECTOMY    . CESAREAN SECTION    . COLONOSCOPY     within 2 years- per pt "had a lot of polyps"  . NERVE AND TENDON REPAIR Right    right hand  . ROTATOR CUFF REPAIR Left   . STOMACH SURGERY     gastric sleeve    There were no vitals filed for this visit.  Subjective Assessment - 01/06/20 1405    Subjective  Got the brace on 01/03/2020 and I feel like it is really helping my walking.  I am getting more tired and they will be doing some bloodwork.  Talked with Dr. Juleen China about speech therapy due to my difficulty getting thoughts to words.    Limitations  Standing;Lifting;Walking    How long can you sit comfortably?  10 min     How long can you stand comfortably?  2-3 min    How long can you walk comfortably?  2-3 min    Diagnostic tests  07/01/2019 Impression: These findings are consistent with all osteoarthritis and mild chondromalacia patella.    Patient Stated Goals  improve balance, decrease pain; 01/06/2020:  to get rid of walker    Currently in Pain?  No/denies    Pain Onset  More than a month ago                       [facility] Adult PT Treatment/Exercise - 01/06/20 1425      Ambulation/Gait   Ambulation/Gait  Yes    Ambulation/Gait Assistance  4: Min guard;5: Supervision    Ambulation/Gait Assistance Details  Gait wearing pt's AFO on RLE throughout session.  Cues for increased RLE heelstrike and to maintain RLE foot flat for stance phase (pt tends to stay up on toes)    Ambulation Distance (Feet)  100 Feet   120, 100 ft   Assistive device  Rolling walker   R AFO   Gait Pattern  Decreased stride length;Trunk flexed;Shuffle;Narrow base of support;Decreased hip/knee flexion - right;Decreased dorsiflexion - right;Step-through pattern;Poor foot clearance - right;Decreased weight shift to right;Right flexed knee in stance    Ambulation Surface  Level;Indoor    Gait velocity  28.16 sec= 1.16 ft/sec    Stairs  Yes    Stairs Assistance  4: Min guard    Stair Management Technique  Two rails;Sideways;Forwards;Step to pattern   Pt no longer has steps into apartment, as pt moved   Number of Stairs  4   x 2   Height of Stairs  6    Pre-Gait Activities  PT discussed/reviewed tips for AFO wear and care. Pt able to verbalize understanding from education provided by orthotist.  She is able to demo/verbalize proper donning/doffing of R AFO.    Gait Comments  PT wearing her AFO for the first time in therapy session today.  Pt demonstrates overall improved RLE foot clearance and initiation of swing phase of gait on RLE; however, with stance phase, she demonstrates knee flexion and up on toes, but is able to  correct with PT's cues.        Standardized Balance Assessment   Standardized Balance Assessment  Berg Balance Test      Berg Balance Test   Sit to Stand  Able to stand  independently using hands    Standing Unsupported  Able to stand 2 minutes with supervision    Sitting with Back Unsupported but Feet Supported on Floor or Stool  Able to sit safely and securely 2 minutes    Stand to Sit  Sits safely with minimal use of hands    Transfers  Able to transfer safely, definite need of hands    Standing Unsupported with Eyes Closed  Able to stand 10 seconds with supervision    Standing Ubsupported with Feet Together  Needs help to attain position but able to stand for 30 seconds with feet together    From Standing, Reach Forward with Outstretched Arm  Can reach forward >12 cm safely (5")   7"   From Standing Position, Pick up Object from Floor  Unable to try/needs assist to keep balance    From Standing Position, Turn to Look Behind Over each Shoulder  Turn sideways only but maintains balance    Turn 360 Degrees  Able to turn 360 degrees safely but slowly   7.08   Standing Unsupported, Alternately Place Feet on Step/Stool  Able to complete >2 steps/needs minimal assist    Standing Unsupported, One Foot in Front  Able to take small step independently and hold 30 seconds    Standing on One Leg  Unable to try or needs assist to prevent fall   RLE unable, LLE >10 sec   Total Score  31      High Level Balance   High Level Balance Activities  Other (comment)    High Level Balance Comments  Standing at counter, wearing R AFO, with R as stance leg:  L side step taps x 5 reps, then L backward taps x 5 reps.      Exercises   Exercises  Other Exercises    Other Exercises   Reviewed seated glut sets, stride stance sit<>stand, with pt needing cues for slowed technique and forward lean with sit<>stand, performed each x 10 reps.  Also, verbalizes performing standing heel/toe raises at home.                PT Short Term Goals -  12/23/19 1521      PT SHORT TERM GOAL #1   Title  Pt will verbalize understanding of fall prevention in home environment.  TARGET 12/20/2019    Time  4    Period  Weeks    Status  Achieved      PT SHORT TERM GOAL #2   Title  Pt will be independent with revisions/updates to HEP for strength, balance, gait.    Baseline  12/20/19: met with current HEP    Status  Achieved        PT Long Term Goals - 01/06/20 1437      PT LONG TERM GOAL #1   Title  Pt will independently don/doff and verbalize proper wear/care of AFO.  TARGET 01/03/2020    Time  6    Period  Weeks    Status  Achieved      PT LONG TERM GOAL #2   Title  pt to be able to walk/ stand >/=30 min and navigate u/dwon >/= 6 steps with LRAD for functional endurance and safety for in home/ community ambulation    Baseline  per patient report 11/20/2019 15 minutes; 01/06/2020:  20 minutes    Time  6    Period  Weeks    Status  Not Met      PT LONG TERM GOAL #3   Title  pt to be I with all HEP given as of last visit to maintain and progress current level of function    Time  6    Period  Weeks    Status  On-going      PT LONG TERM GOAL #4   Title  increase Berg balance to >/= 28/56 to demo improvment in balance and stability    Baseline  21/56 11/20/19    Time  6    Period  Weeks    Status  Achieved            Plan - 01/06/20 1545    Clinical Impression Statement  Began assessing LTGs this visit, with pt not meeting LTG 2 for standing and functional endurance, though she reports improvmeent to standing/gait x 20 minutes.  LTG 4 met for improved Berg score to 31/56; this measure has progressively increased, from 21/56 at last check.  Did educate pt this continues to mean she is at high fall risk and use of RW continues to be recommended.  Pt does present wearing her new R AFO today, with improved foot clearance noted and improved initiation for swing phase of gait.  She continues to  have some gait deviations, including knee flexed and up on toes in stance phase, and is able to correct with cues.  She will continue to benefit from skilled PT to further improve balance and gait for improved independence and decreased fall risk.    Examination-Activity Limitations  Stand;Stairs;Squat;Locomotion Level;Bend;Bathing    PT Frequency  2x / week    PT Duration  6 weeks   per recert 9/82/6415   PT Treatment/Interventions  ADLs/Self Care Home Management;Cryotherapy;Electrical Stimulation;Iontophoresis 64m/ml Dexamethasone;Moist Heat;Ultrasound;Gait training;Stair training;Functional mobility training;Therapeutic activities;Therapeutic exercise;Balance training;Neuromuscular re-education;Manual techniques;Passive range of motion;Taping;Dry needling    PT Next Visit Plan  Check remaining LTGS (add to HEP in standing); next visit will need to do cert, as pt is progressing towards goals and will benefit from continued PT    PT [place] Agree with Plan of Care  Patient  Patient will benefit from skilled therapeutic intervention in order to improve the following deficits and impairments:  Abnormal gait, Pain, Improper body mechanics, Postural dysfunction, Decreased strength, Difficulty walking, Decreased balance, Decreased activity tolerance, Decreased coordination, Decreased endurance, Decreased range of motion, Decreased mobility  Visit Diagnosis: Other abnormalities of gait and mobility  Unsteadiness on feet     Problem List Patient Active Problem List   Diagnosis Date Noted  . Cervicalgia 06/11/2019  . Right trigeminal neuralgia 06/11/2019  . Prediabetes 08/20/2013  . Elevated LFTs 08/20/2013  . Fatty liver 08/20/2013  . Vaginitis and vulvovaginitis 07/12/2013  . 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 Lupus (systemic lupus erythematosus) ([place]) 06/24/2013    Mihail Prettyman W. 01/06/2020, 3:51 PM  Frazier Butt., PT  [facility] [address] [place], [place], [address] Phone: [phone]   Fax:  [phone]  Name: Brittney Maddox MRN: [id number] Date of Birth: 03-19-1966

## 2020-01-06 NOTE — Telephone Encounter (Signed)
Received a new hem referral Dr. Margarita Rana for neutropenia. Pt has been cld and scheduled to see Dr. Lindi Adie on 4/30 at 8:15am. Aware to arrive 15 minutes early.

## 2020-01-06 NOTE — Telephone Encounter (Signed)
Pt came in asking for speech therapy

## 2020-01-08 ENCOUNTER — Ambulatory Visit: Payer: Medicare Other | Admitting: Physical Therapy

## 2020-01-08 ENCOUNTER — Encounter: Payer: Self-pay | Admitting: Physical Therapy

## 2020-01-08 ENCOUNTER — Other Ambulatory Visit: Payer: Self-pay

## 2020-01-08 DIAGNOSIS — R2689 Other abnormalities of gait and mobility: Secondary | ICD-10-CM | POA: Diagnosis not present

## 2020-01-08 DIAGNOSIS — R2681 Unsteadiness on feet: Secondary | ICD-10-CM

## 2020-01-08 DIAGNOSIS — M6281 Muscle weakness (generalized): Secondary | ICD-10-CM

## 2020-01-08 NOTE — Therapy (Signed)
[facility] [address] [place], [place], [address] Phone: [phone]   Fax:  [phone]  Physical Therapy Treatment  Patient Details  Name: Brittney Maddox MRN: [id number] Date of Birth: January 04, 1966 Referring Provider (PT): Charlott Rakes, MD   Encounter Date: 01/08/2020  PT End of Session - 01/08/20 1450    Visit Number  17    Number of Visits  22    Date for PT Re-Evaluation  14/97/02   per recert 6/37/8588   Authorization Type  MCR: KX mod at 15th visit, progress note at 10th visit    Progress Note Due on Visit  20    PT Start Time  1446    PT Stop Time  1525    PT Time Calculation (min)  39 min    Equipment Utilized During Treatment  Gait belt   use of [facility] AFO   Activity Tolerance  Patient tolerated treatment well;Patient limited by fatigue    Behavior During Therapy  [facility] for tasks assessed/performed       Past Medical History:  Diagnosis Date  . Allergy   . Anemia   . Asthma   . Blood transfusion without reported diagnosis   . DVT (deep venous thrombosis) ([place])    2020- no blood thinners  . Lupus ([place])   . Myocardial infarction ([place])    "slight" per pt- 2014  . Rheumatoid arthritis ([place])   . Trigeminal neuralgia     Past Surgical History:  Procedure Laterality Date  . ABDOMINAL HYSTERECTOMY    . CESAREAN SECTION    . COLONOSCOPY     within 2 years- per pt "had a lot of polyps"  . NERVE AND TENDON REPAIR Right    right hand  . ROTATOR CUFF REPAIR Left   . STOMACH SURGERY     gastric sleeve    There were no vitals filed for this visit.  Subjective Assessment - 01/08/20 1449    Subjective  Have been wearing the brace on the RLE since the other day.  No problems.    Limitations  Standing;Lifting;Walking    How long can you sit comfortably?  10 min    How long can you stand comfortably?  2-3 min    How long can you walk comfortably?  2-3 min    Diagnostic tests  07/01/2019  Impression: These findings are consistent with all osteoarthritis and mild chondromalacia patella.    Patient Stated Goals  improve balance, decrease pain; 01/06/2020:  to get rid of walker    Currently in Pain?  Yes    Pain Score  6     Pain Location  Knee    Pain Orientation  Left    Pain Descriptors / Indicators  Throbbing;Aching    Pain Type  Chronic pain    Pain Onset  More than a month ago    Pain Frequency  Constant    Aggravating Factors   too much activity    Pain Relieving Factors  unsure       Therapeutic Exercise:   Seated marching x 10 reps Sit to stand x 5 reps, then additional 5 reps through session   Standing lateral weightshifting x 10 reps, BUE support Standing feet shoulder width apart:  alt UE reaching to cabinet x 10 reps Alt step taps to 4" step, x 10 reps, cues for technique, full weightbearing and knee extension RLE in stance Side step together R and L, x 10  reps with UE Side step and weightshift x 10 reps each side, then alternating sides x 5 reps Back step and weightshift x 10 reps each side, then alternating back steps x 5 reps (cues for posture and technique) Forward step and weightshift x 10 reps each side, then alternating forward steps x 5 reps.    Pt requires seated rest breaks during standing exercises, cues for upright posture, as pt begins to lean forward through trunk onto counter with fatigue            [facility] Adult PT Treatment/Exercise - 01/08/20 1517      Transfers   Transfers  Sit to Stand;Stand to Sit    Sit to Stand  5: Supervision;With upper extremity assist;From bed;From chair/3-in-1    Stand to Sit  5: Supervision;With upper extremity assist;To bed;To chair/3-in-1      Ambulation/Gait   Ambulation/Gait  Yes    Ambulation/Gait Assistance  4: Min guard;5: Supervision    Ambulation/Gait Assistance Details  Pt fatigued at end of walking 230 ft, feels knees/hips tired, almost like knees buckling; able to regain posture and turn  to prepare to sit at mat.    Ambulation Distance (Feet)  230 Feet   then 100 ft   Assistive device  Rolling walker   AFO   Gait Pattern  Decreased stride length;Trunk flexed;Shuffle;Narrow base of support;Decreased hip/knee flexion - right;Decreased dorsiflexion - right;Step-through pattern;Poor foot clearance - right;Decreased weight shift to right;Right flexed knee in stance    Ambulation Surface  Level;Indoor    Gait Comments  Cues for posture, cues to avoid heavy reliance through UEs; cues for fluid motion with RLE in stance and swing phase             PT Education - 01/08/20 1842    Education Details  Updates to HEP-see instructions    Person(s) Educated  Patient    Methods  Explanation;Demonstration;Handout    Comprehension  Verbalized understanding;Returned demonstration       PT Short Term Goals - 12/23/19 1521      PT SHORT TERM GOAL #1   Title  Pt will verbalize understanding of fall prevention in home environment.  TARGET 12/20/2019    Time  4    Period  Weeks    Status  Achieved      PT SHORT TERM GOAL #2   Title  Pt will be independent with revisions/updates to HEP for strength, balance, gait.    Baseline  12/20/19: met with current HEP    Status  Achieved        PT Long Term Goals - 01/08/20 1844      PT LONG TERM GOAL #1   Title  Pt will independently don/doff and verbalize proper wear/care of AFO.  TARGET 01/03/2020    Time  6    Period  Weeks    Status  Achieved      PT LONG TERM GOAL #2   Title  pt to be able to walk/ stand >/=30 min and navigate u/dwon >/= 6 steps with LRAD for functional endurance and safety for in home/ community ambulation    Baseline  per patient report 11/20/2019 15 minutes; 01/06/2020:  20 minutes    Time  6    Period  Weeks    Status  Not Met      PT LONG TERM GOAL #3   Title  pt to be I with all HEP given as of last visit to maintain and  progress current level of function    Time  6    Period  Weeks    Status  On-going       PT LONG TERM GOAL #4   Title  increase Berg balance to >/= 28/56 to demo improvment in balance and stability    Baseline  21/56 11/20/19    Time  6    Period  Weeks    Status  Achieved            Plan - 01/08/20 1843    Clinical Impression Statement  LTG for HEP is ongoing, as standing exercises added this visit.  Pt is wearing AFO at all times during the day, and pt's gait is improved with AFO.  However, with fatigue throughout session, pt's gait pattern becomes more unsteady with decreased R foot clearance (improves with cues).  Pt appears to have decreased trunk strength, with increased trunk sway at times.  Pt has only had 2 sessions of therapy with AFO training, and pt will continue to benefit from skilled PT to further address strength, balance, gait for improved overall mobility.    Examination-Activity Limitations  Stand;Stairs;Squat;Locomotion Level;Bend;Bathing    PT Frequency  2x / week    PT Duration  4 weeks   per recert 03/14/5008   PT Treatment/Interventions  ADLs/Self Care Home Management;Cryotherapy;Electrical Stimulation;Iontophoresis 6m/ml Dexamethasone;Moist Heat;Ultrasound;Gait training;Stair training;Functional mobility training;Therapeutic activities;Therapeutic exercise;Balance training;Neuromuscular re-education;Manual techniques;Passive range of motion;Taping;Dry needling    PT Next Visit Plan  REview HEP in standing; continue to work on standing exercises; work on trunk control and trunk stregnthening; [place] with Plan of Care  Patient       Patient will benefit from skilled therapeutic intervention in order to improve the following deficits and impairments:  Abnormal gait, Pain, Improper body mechanics, Postural dysfunction, Decreased strength, Difficulty walking, Decreased balance, Decreased activity tolerance, Decreased coordination, Decreased endurance, Decreased range of motion,  Decreased mobility  Visit Diagnosis: Other abnormalities of gait and mobility  Unsteadiness on feet  Muscle weakness (generalized)     Problem List Patient Active Problem List   Diagnosis Date Noted  . Cervicalgia 06/11/2019  . Right trigeminal neuralgia 06/11/2019  . Prediabetes 08/20/2013  . Elevated LFTs 08/20/2013  . Fatty liver 08/20/2013  . Vaginitis and vulvovaginitis 07/12/2013  . 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 Lupus (systemic lupus erythematosus) ([place] 06/24/2013    Brittney Maddox W. 01/08/2020, 6:57 PM  MFrazier Butt, PT   [place] [address]Phone: [phone]  Fax:  [phone] Name: Brittney MANUELEMRN: [id number] of Birth: [birth year]  New LTGs for recert: PT Long Term Goals - 01/08/20 1901      PT LONG TERM GOAL #1   Title  Pt will improve gait velocity to at least 1.8 ft/sec, wearing AFO, for improved gait efficiency and safety/decreased fall risk.  TARGET 02/14/2020    Time  4    Period  Weeks    Status  Revised      PT LONG TERM GOAL #2   Title  Pt will ambulate at least 1000 ft, indoors and outdoor surfaces, with RW and AFO for improved gait in community.    Time  4    Period  Weeks    Status  Revised      PT LONG TERM GOAL #3  Title  pt to be I with all HEP updates/progression given as of last visit to maintain and progress current level of function    Time  4    Period  Weeks    Status  Revised      PT LONG TERM GOAL #4   Title  increase Berg balance to >/= 38/56 to demo improvment in balance and stability    Baseline  31/56 01/06/2020    Time  4    Period  Weeks    Status  Revised      Mady Haagensen, PT 01/08/20 7:04 PM Phone: [phone] Fax: [phone]

## 2020-01-08 NOTE — Patient Instructions (Signed)
Access Code: [email or web address] URL: [email or web address] Date: 01/08/2020 Prepared by: Mady Haagensen  Exercises Seated March - 1 x daily - 7 x weekly - 10 reps - 2 sets Seated Gluteal Sets - 1-2 x daily - 7 x weekly - 10 reps - 2 sets - 3 sec hold Seated Knee Extension with Resistance - 1 x daily - 5 x weekly - 1 sets - 10 reps Seated Hip Abduction - 2 x daily - 7 x weekly - 10 reps - 2 sets Sit to stand in stride stance - 1-2 x daily - 5 x weekly - 10 reps - 1 sets Supine Bridge - 1 x daily - 7 x weekly - 10 reps - 2 sets Heel Toe Raises with Counter Support - 1 x daily - 5 x weekly - 1 sets - 10 reps Side to side weightshift - 1 x daily - 5 x weekly - 1-2 sets - 10 reps Alternating Step Taps with Counter Support - 1 x daily - 5 x weekly - 1-2 sets - 10 reps

## 2020-01-09 NOTE — Progress Notes (Signed)
[facility] CONSULT NOTE  Patient Care Team: Nicolette Bang, DO as PCP - General (Family Medicine)  CHIEF COMPLAINTS/PURPOSE OF CONSULTATION:  Newly diagnosed leukopenia  HISTORY OF PRESENTING ILLNESS:  Brittney Maddox 54 y.o. female is here because of recent diagnosis of neutropenia. She is referred by Dr. Margarita Rana. Labs on 09/03/19 showed WBC 3.4 and on 01/01/19 WBC 2.5. She presents to the clinic today for initial evaluation.  Patient was diagnosed with lupus about a year ago and was started on Imuran in December.  Prior to starting Imuran she had fairly normal white blood cell count.  Recent labs done couple of weeks ago showed a decrease in the WBC count to 2.5.  She was referred to [place] for discussion and recommendations regarding the leukopenia.  Differential was not performed on the recent blood work. Prior to this she had a head trauma which led to multiple neurophysiological problems.  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  She developed profound discoordination and most recently word finding difficulties.  She is under the care of neurology and rheumatology for her extensive problems regarding her imbalance as well as cranial symptoms.  I reviewed her records extensively and collaborated the history with the patient.  MEDICAL HISTORY:  Past Medical History:  Diagnosis Date  . Allergy   . Anemia   . Asthma   . Blood transfusion without reported diagnosis   . DVT (deep venous thrombosis) ([place])    2020- no blood thinners  . Lupus ([place])   . Myocardial infarction ([place])    "slight" per pt- 2014  . Rheumatoid arthritis ([place])   . Trigeminal neuralgia     SURGICAL HISTORY: Past Surgical History:  Procedure Laterality Date  . ABDOMINAL HYSTERECTOMY    . CESAREAN SECTION    . COLONOSCOPY     within 2 years- per pt "had a lot of polyps"  . NERVE AND TENDON REPAIR Right    right hand  . ROTATOR CUFF REPAIR Left   . STOMACH SURGERY     gastric  sleeve    SOCIAL HISTORY: Social History   Socioeconomic History  . Marital status: Legally Separated    Spouse name: Not on file  . Number of children: 5  . Years of education: Not on file  . Highest education level: Not on file  Occupational History  . Not on file  Tobacco Use  . Smoking status: Never Smoker  . Smokeless tobacco: Never Used  Substance and Sexual Activity  . Alcohol use: No  . Drug use: No  . Sexual activity: Yes    Birth control/protection: Surgical  Other Topics Concern  . Not on file  Social History Narrative  . Not on file   Social Determinants of Health   Financial Resource Strain:   . Difficulty of Paying Living Expenses:   Food Insecurity:   . Worried About Charity fundraiser in the Last Year:   . Arboriculturist in the Last Year:   Transportation Needs:   . Film/video editor (Medical):   Marland Kitchen Lack of Transportation (Non-Medical):   Physical Activity:   . Days of Exercise per Week:   . Minutes of Exercise per Session:   Stress:   . Feeling of Stress :   Social Connections:   . Frequency of Communication with Friends and Family:   . Frequency of Social Gatherings with Friends and Family:   . Attends Religious Services:   .  Active Member of Clubs or Organizations:   . Attends Archivist Meetings:   Marland Kitchen Marital Status:   Intimate Partner Violence:   . Fear of Current or Ex-Partner:   . Emotionally Abused:   Marland Kitchen Physically Abused:   . Sexually Abused:     FAMILY HISTORY: Family History  Problem Relation Age of Onset  . Heart disease Mother   . Osteoporosis Mother   . Breast cancer Mother   . Prostate cancer Father   . Aneurysm Father   . Diabetes Father   . Migraines Father   . Colon cancer Father   . Healthy Daughter   . Healthy Daughter   . Healthy Daughter   . Healthy Daughter   . Healthy Son   . Diabetes Brother   . Colon cancer Paternal Aunt   . Breast cancer Maternal Aunt   . Esophageal cancer Neg Hx   .  Stomach cancer Neg Hx   . Rectal cancer Neg Hx     ALLERGIES:  has No Known Allergies.  MEDICATIONS:  Current Outpatient Medications  Medication Sig Dispense Refill  . azaTHIOprine (IMURAN) 50 MG tablet Take 1 tablet by mouth daily.    . butalbital-acetaminophen-caffeine (FIORICET) 50-325-40 MG tablet TAKE 1-2 TABLETS BY MOUTH EVERY 12 (TWELVE) HOURS AS NEEDED FOR HEADACHE. 30 tablet 0  . carbamazepine (TEGRETOL) 200 MG tablet TAKE 1 TABLET BY MOUTH THREE TIMES A DAY 90 tablet 1  . Cholecalciferol 125 MCG (5000 UT) capsule Take 1 capsule by mouth daily.    Marland Kitchen Dextran 70-Hypromellose 0.1-0.3 % SOLN Apply 2 drops to eye 3 (three) times daily as needed.    . diclofenac Sodium (VOLTAREN) 1 % GEL APPLY 4 G TOPICALLY 4 (FOUR) TIMES DAILY. 100 g 2  . gabapentin (NEURONTIN) 300 MG capsule Take 1 capsule (300 mg total) by mouth 3 (three) times daily. 90 capsule 1  . hydroxychloroquine (PLAQUENIL) 200 MG tablet Take 1 tablet (200 mg total) by mouth 2 times daily. Do not take on saturdays and sundays    . Misc. Devices MISC Right AFO brace. diagnosis: Right foot drag 1 each 0  . predniSONE (DELTASONE) 5 MG tablet Take 5 mg by mouth daily.    Marland Kitchen tiZANidine (ZANAFLEX) 4 MG tablet Take 1 tablet (4 mg total) by mouth every 8 (eight) hours as needed for muscle spasms. 90 tablet 3  . topiramate (TOPAMAX) 50 MG tablet Take 1 tablet (50 mg total) by mouth 2 (two) times daily. 60 tablet 2   No current facility-administered medications for this visit.    REVIEW OF SYSTEMS:   Constitutional: Denies fevers, chills or abnormal night sweats Eyes: Denies blurriness of vision, double vision or watery eyes Ears, nose, mouth, throat, and face: Denies mucositis or sore throat Respiratory: Denies cough, dyspnea or wheezes Cardiovascular: Denies palpitation, chest discomfort or lower extremity swelling Gastrointestinal:  Denies nausea, heartburn or change in bowel habits Skin: Denies abnormal skin rashes Lymphatics:  Denies new lymphadenopathy or easy bruising Neurological:Denies numbness, tingling or new weaknesses Behavioral/Psych: Mood is stable, no new changes  Breast: Denies any palpable lumps or discharge All other systems were reviewed with the patient and are negative.  PHYSICAL EXAMINATION: ECOG PERFORMANCE STATUS: 1 - Symptomatic but completely ambulatory  Vitals:   01/10/20 0820  BP: 97/76  Pulse: 67  Resp: 17  Temp: 98.7 F (37.1 C)  SpO2: 100%   Filed Weights   01/10/20 0820  Weight: 149 lb (67.6 kg)  GENERAL:alert, no distress and comfortable SKIN: skin color, texture, turgor are normal, no rashes or significant lesions EYES: normal, conjunctiva are pink and non-injected, sclera clear OROPHARYNX:no exudate, no erythema and lips, buccal mucosa, and tongue normal  NECK: supple, thyroid normal size, non-tender, without nodularity LYMPH:  no palpable lymphadenopathy in the cervical, axillary or inguinal LUNGS: clear to auscultation and percussion with normal breathing effort HEART: regular rate & rhythm and no murmurs and no lower extremity edema ABDOMEN:abdomen soft, non-tender and normal bowel sounds Musculoskeletal:no cyanosis of digits and no clubbing  PSYCH: alert & oriented x 3 with fluent speech NEURO: no focal motor/sensory deficits  LABORATORY DATA:  I have reviewed the data as listed Lab Results  Component Value Date   WBC 2.5 (LL) 01/01/2020   HGB 13.3 01/01/2020   HCT 39.1 01/01/2020   MCV 88 01/01/2020   PLT 232 01/01/2020   Lab Results  Component Value Date   NA 145 (H) 01/01/2020   K 4.5 01/01/2020   CL 109 (H) 01/01/2020   CO2 23 01/01/2020    RADIOGRAPHIC STUDIES: I have personally reviewed the radiological reports and agreed with the findings in the report.  ASSESSMENT AND PLAN:  Drug-induced leukopenia (HCC) History of SLE started on Imuran 50 mg tablet since August 19, 2019 along with Plaquenil. 09/03/2019: WBC 3.4, ANC 2.8 01/01/2020:  WBC 2.5, no differential For 30 2021: WBC 2.5, ANC 0.7  I discussed with the patient that the cause of leukopenia is Imuran therapy. 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 Our recommendation is to titrate the Imuran to keep the [place] greater than 1.5.  We will obtain a CBC with differential today. I do not believe there is any primary bone marrow disorder that warrants further investigation.   We can see her on an as-needed basis.   All questions were answered. The patient knows to call the clinic with any problems, questions or concerns.   Rulon Eisenmenger, MD, MPH 01/10/2020    I, Molly Dorshimer, am acting as scribe for Nicholas Lose, MD.  I have reviewed the above documentation for accuracy and completeness, and I agree with the above.

## 2020-01-10 ENCOUNTER — Other Ambulatory Visit: Payer: Self-pay

## 2020-01-10 ENCOUNTER — Inpatient Hospital Stay (HOSPITAL_BASED_OUTPATIENT_CLINIC_OR_DEPARTMENT_OTHER): Payer: Medicare Other | Admitting: Hematology and Oncology

## 2020-01-10 ENCOUNTER — Inpatient Hospital Stay: Payer: Medicare Other | Attending: Hematology and Oncology

## 2020-01-10 DIAGNOSIS — R2689 Other abnormalities of gait and mobility: Secondary | ICD-10-CM | POA: Diagnosis not present

## 2020-01-10 DIAGNOSIS — Z86718 Personal history of other venous thrombosis and embolism: Secondary | ICD-10-CM

## 2020-01-10 DIAGNOSIS — I252 Old myocardial infarction: Secondary | ICD-10-CM

## 2020-01-10 DIAGNOSIS — D702 Other drug-induced agranulocytosis: Secondary | ICD-10-CM | POA: Insufficient documentation

## 2020-01-10 DIAGNOSIS — Z79899 Other long term (current) drug therapy: Secondary | ICD-10-CM | POA: Diagnosis not present

## 2020-01-10 DIAGNOSIS — M329 Systemic lupus erythematosus, unspecified: Secondary | ICD-10-CM | POA: Insufficient documentation

## 2020-01-10 DIAGNOSIS — M069 Rheumatoid arthritis, unspecified: Secondary | ICD-10-CM | POA: Insufficient documentation

## 2020-01-10 LAB — CBC WITH DIFFERENTIAL (CANCER CENTER ONLY)
Abs Immature Granulocytes: 0 10*3/uL (ref 0.00–0.07)
Basophils Absolute: 0 10*3/uL (ref 0.0–0.1)
Basophils Relative: 1 %
Eosinophils Absolute: 0.1 10*3/uL (ref 0.0–0.5)
Eosinophils Relative: 2 %
HCT: 37.8 % (ref 36.0–46.0)
Hemoglobin: 12.1 g/dL (ref 12.0–15.0)
Immature Granulocytes: 0 %
Lymphocytes Relative: 60 %
Lymphs Abs: 1.5 10*3/uL (ref 0.7–4.0)
MCH: 30 pg (ref 26.0–34.0)
MCHC: 32 g/dL (ref 30.0–36.0)
MCV: 93.8 fL (ref 80.0–100.0)
Monocytes Absolute: 0.2 10*3/uL (ref 0.1–1.0)
Monocytes Relative: 9 %
Neutro Abs: 0.7 10*3/uL — ABNORMAL LOW (ref 1.7–7.7)
Neutrophils Relative %: 28 %
Platelet Count: 185 10*3/uL (ref 150–400)
RBC: 4.03 MIL/uL (ref 3.87–5.11)
RDW: 12.4 % (ref 11.5–15.5)
WBC Count: 2.5 10*3/uL — ABNORMAL LOW (ref 4.0–10.5)
nRBC: 0 % (ref 0.0–0.2)

## 2020-01-10 MED ORDER — DICLOFENAC SODIUM 1 % EX GEL: CUTANEOUS | 1 refills | Status: AC

## 2020-01-10 NOTE — Assessment & Plan Note (Signed)
History of SLE started on Imuran 50 mg tablet since August 19, 2019 along with Plaquenil. 09/03/2019: WBC 3.4, ANC 2.8 01/01/2020: WBC 2.5, no differential I discussed with the patient that the cause of leukopenia is Imuran therapy. 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 Our recommendation is to titrate the Imuran to keep the [place] greater than 1.5. We will obtain a CBC with differential today. I do not believe there is any primary bone marrow disorder that warrants further investigation. Please do not hesitate to contact us if there are further concerns about her blood work.

## 2020-01-13 ENCOUNTER — Telehealth: Payer: Self-pay | Admitting: Hematology and Oncology

## 2020-01-13 NOTE — Telephone Encounter (Signed)
No 4/30 los. No changes made to pt's schedule.  

## 2020-01-14 ENCOUNTER — Other Ambulatory Visit: Payer: Self-pay | Admitting: Neurology

## 2020-01-20 ENCOUNTER — Ambulatory Visit: Payer: Medicare Other | Admitting: Physical Therapy

## 2020-01-23 ENCOUNTER — Ambulatory Visit: Payer: Medicare Other | Attending: Family Medicine | Admitting: Physical Therapy

## 2020-01-27 ENCOUNTER — Ambulatory Visit: Payer: Medicare Other | Admitting: Physical Therapy

## 2020-01-27 ENCOUNTER — Other Ambulatory Visit: Payer: Self-pay

## 2020-01-27 DIAGNOSIS — M6281 Muscle weakness (generalized): Secondary | ICD-10-CM | POA: Insufficient documentation

## 2020-01-27 DIAGNOSIS — R2689 Other abnormalities of gait and mobility: Secondary | ICD-10-CM | POA: Insufficient documentation

## 2020-01-27 DIAGNOSIS — M329 Systemic lupus erythematosus, unspecified: Secondary | ICD-10-CM | POA: Diagnosis not present

## 2020-01-27 DIAGNOSIS — Z86718 Personal history of other venous thrombosis and embolism: Secondary | ICD-10-CM | POA: Insufficient documentation

## 2020-01-27 DIAGNOSIS — I252 Old myocardial infarction: Secondary | ICD-10-CM | POA: Diagnosis not present

## 2020-01-27 DIAGNOSIS — M069 Rheumatoid arthritis, unspecified: Secondary | ICD-10-CM | POA: Insufficient documentation

## 2020-01-27 DIAGNOSIS — R2681 Unsteadiness on feet: Secondary | ICD-10-CM | POA: Diagnosis not present

## 2020-01-27 NOTE — Therapy (Signed)
[facility] [address] [place], [place], [address] Phone: [phone]   Fax:  [phone]  Physical Therapy Treatment  Patient Details  Name: Brittney Maddox MRN: [id number] Date of Birth: 08/20/1966 Referring Provider (PT): Charlott Rakes, MD   Encounter Date: 01/27/2020  PT End of Session - 01/27/20 1315    Visit Number  18    Number of Visits  25   should have been corrected to 25 at recert 1/60/7371   Date for PT Re-Evaluation  03/07/93   per recert 8/54/6270   Authorization Type  MCR: KX mod at 15th visit, progress note at 10th visit    Progress Note Due on Visit  20    PT Start Time  1229    PT Stop Time  1313    PT Time Calculation (min)  44 min    Equipment Utilized During Treatment  Gait belt   use of [facility] AFO   Activity Tolerance  Patient tolerated treatment well    Behavior During Therapy  [facility] for tasks assessed/performed       Past Medical History:  Diagnosis Date  . Allergy   . Anemia   . Asthma   . Blood transfusion without reported diagnosis   . DVT (deep venous thrombosis) ([place])    2020- no blood thinners  . Lupus ([place])   . Myocardial infarction ([place])    "slight" per pt- 2014  . Rheumatoid arthritis ([place])   . Trigeminal neuralgia     Past Surgical History:  Procedure Laterality Date  . ABDOMINAL HYSTERECTOMY    . CESAREAN SECTION    . COLONOSCOPY     within 2 years- per pt "had a lot of polyps"  . NERVE AND TENDON REPAIR Right    right hand  . ROTATOR CUFF REPAIR Left   . STOMACH SURGERY     gastric sleeve    There were no vitals filed for this visit.  Subjective Assessment - 01/27/20 1231    Subjective  I keep tensing up my shoulders, I don't know why.  I've been doing a little walking without the walker and did well.  Had a good time on my trip.  No falls on the trip.    Limitations  Standing;Lifting;Walking    How long can you sit comfortably?  10 min    How  long can you stand comfortably?  2-3 min    How long can you walk comfortably?  2-3 min    Diagnostic tests  07/01/2019 Impression: These findings are consistent with all osteoarthritis and mild chondromalacia patella.    Patient Stated Goals  improve balance, decrease pain; 01/06/2020:  to get rid of walker    Currently in Pain?  Yes    Pain Score  8     Pain Location  Shoulder    Pain Orientation  Left;Right    Pain Descriptors / Indicators  Aching    Pain Onset  More than a month ago    Pain Frequency  Intermittent    Aggravating Factors   hiking my shoulders during walking    Pain Relieving Factors  using patches at night                        [facility] Adult PT Treatment/Exercise - 01/27/20 1236      Transfers   Transfers  Sit to Stand;Stand to Sit    Sit to Stand  5:  Supervision;With upper extremity assist;From bed;From chair/3-in-1    Stand to Sit  5: Supervision;With upper extremity assist;To bed;To chair/3-in-1      Ambulation/Gait   Ambulation/Gait  Yes    Ambulation/Gait Assistance  4: Min assist    Ambulation/Gait Assistance Details  Per pt request, trialed gait with lesser assistive device; overall, pt does well with HHA and with cane.  She needs cues for sequencing cane initially; no overt LOB or significant fatigue noted with 115 ft bouts of gait.      Ambulation Distance (Feet)  115 Feet   x 4 reps   Assistive device  1 person hand held assist;Straight cane;Rolling walker    Gait Pattern  Decreased stride length;Trunk flexed;Shuffle;Narrow base of support;Decreased hip/knee flexion - right;Decreased dorsiflexion - right;Step-through pattern;Poor foot clearance - right;Decreased weight shift to right;Right flexed knee in stance    Ambulation Surface  Level;Indoor    Pre-Gait Activities  Gait training with SPC, with cues for technique and sequence    Gait Comments  Pt with less heavy reliance on BUEs during gait with cane today.  After gait with cane, also  worked on gait with RW with lessened reliance on UE support.  PT recommends pt continue to use RW (due to Berg score 31/56 on 4/26 indicating fall risk and RW still most appropriate), but to practice RW with lightened UE support, almost like glideing.  Pt return demo understanding.  Will further discuss cane vs. HHA of family at next visit.      Exercises   Exercises  Other Exercises    Other Exercises   Trunk stability/posture exercises:  Seated shoulder circles x 5 reps, anterior/posterior pelvic tilts x 5 reps, lateral pelvic tilts x 5 reps (on solid surface, then on green disc);  then forward lean to upright posture x 5 (on solid mat then seated on green disk), scapular retraction x 5 reps .  While seated on green disc, marching in place x 5 reps.  Attempted alternating UE lifts, x 5 reps      Knee/Hip Exercises: Standing   Functional Squat  1 set;10 reps    Other Standing Knee Exercises  Marching in place x 10 reps, side step R and L (step together/repeat) x 10 reps       Additional gait, 100 ft x 2 with RW.  Cues provided throughout for relaxed posture through shoulders, to decrease hiking/elevation of shoulders.  Pt with more relaxed posture throughout session.    Reviewed HEP from last visit, with pt return demo understanding:  Heel Toe Raises with Counter Support - 1 x daily - 5 x weekly - 1 sets - 10 reps Side to side weightshift - 1 x daily - 5 x weekly - 1-2 sets - 10 reps Alternating Step Taps with Counter Support - 1 x daily - 5 x weekly - 1-2 sets - 10 reps       PT Short Term Goals - 12/23/19 1521      PT SHORT TERM GOAL #1   Title  Pt will verbalize understanding of fall prevention in home environment.  TARGET 12/20/2019    Time  4    Period  Weeks    Status  Achieved      PT SHORT TERM GOAL #2   Title  Pt will be independent with revisions/updates to HEP for strength, balance, gait.    Baseline  12/20/19: met with current HEP    Status  Achieved  PT Long  Term Goals - 01/08/20 1901      PT LONG TERM GOAL #1   Title  Pt will improve gait velocity to at least 1.8 ft/sec, wearing AFO, for improved gait efficiency and safety/decreased fall risk.  TARGET 02/14/2020    Time  4    Period  Weeks    Status  Revised      PT LONG TERM GOAL #2   Title  Pt will ambulate at least 1000 ft, indoors and outdoor surfaces, with RW and AFO for improved gait in community.    Time  4    Period  Weeks    Status  Revised      PT LONG TERM GOAL #3   Title  pt to be I with all HEP updates/progression given as of last visit to maintain and progress current level of function    Time  4    Period  Weeks    Status  Revised      PT LONG TERM GOAL #4   Title  increase Berg balance to >/= 38/56 to demo improvment in balance and stability    Baseline  31/56 01/06/2020    Time  4    Period  Weeks    Status  Revised            Plan - 01/27/20 1519    Clinical Impression Statement  Pt has been out of town on trip and last therapy visit was 01/08/2020.  She returns today with overall improved steadiness with gait, decreased to no episodes of R foot catching or R knee buckling with gait.  She reports walking short distances with family with HHA with no falls.  Trialed short distances with HHA today and with cane for gait training with lesser assistive device; will need more practice and training, as pt remains at fall risk per Berg score of 31/56.  She will conitnue to benefit from skilled PT to further address strength, balance, and gait for overall improved functional mobility.    Examination-Activity Limitations  Stand;Stairs;Squat;Locomotion Level;Bend;Bathing    PT Frequency  2x / week    PT Duration  4 weeks   per recert 2/45/8099   PT Treatment/Interventions  ADLs/Self Care Home Management;Cryotherapy;Electrical Stimulation;Iontophoresis 33m/ml Dexamethasone;Moist Heat;Ultrasound;Gait training;Stair training;Functional mobility training;Therapeutic  activities;Therapeutic exercise;Balance training;Neuromuscular re-education;Manual techniques;Passive range of motion;Taping;Dry needling    PT Next Visit Plan  Continue to work on standing exercises; work on trunk control and trunk stregnthening; gait progression to cane/HHA if able; continue to work towards [place] Agree with Plan of Care  Patient       Patient will benefit from skilled therapeutic intervention in order to improve the following deficits and impairments:  Abnormal gait, Pain, Improper body mechanics, Postural dysfunction, Decreased strength, Difficulty walking, Decreased balance, Decreased activity tolerance, Decreased coordination, Decreased endurance, Decreased range of motion, Decreased mobility  Visit Diagnosis: Muscle weakness (generalized)  Other abnormalities of gait and mobility  Unsteadiness on feet     Problem List Patient Active Problem List   Diagnosis Date Noted  . Drug-induced leukopenia ([place] 01/10/2020  . Cervicalgia 06/11/2019  . Right trigeminal neuralgia 06/11/2019  . Prediabetes 08/20/2013  . Elevated LFTs 08/20/2013  . Fatty liver 08/20/2013  . Vaginitis and vulvovaginitis 07/12/2013  . 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 Lupus (systemic lupus erythematosus) [place] 06/24/2013    Edris Schneck W. 01/27/2020, 3:24 PM MFrazier Butt, PT  [facility] [address] [place], [place], [address] Phone: [phone]   Fax:  [phone]  Name: Brittney Maddox MRN: [id number] Date of Birth: 1966/03/31

## 2020-01-30 ENCOUNTER — Ambulatory Visit: Payer: Medicare Other | Admitting: Physical Therapy

## 2020-02-03 ENCOUNTER — Ambulatory Visit: Payer: Medicare Other | Admitting: Physical Therapy

## 2020-02-04 ENCOUNTER — Emergency Department (HOSPITAL_COMMUNITY): Payer: Medicare Other

## 2020-02-04 ENCOUNTER — Other Ambulatory Visit: Payer: Self-pay

## 2020-02-04 ENCOUNTER — Encounter (HOSPITAL_COMMUNITY): Payer: Self-pay | Admitting: Emergency Medicine

## 2020-02-04 ENCOUNTER — Ambulatory Visit
Admission: EM | Admit: 2020-02-04 | Discharge: 2020-02-04 | Discharge status: Absent without leave | Payer: Medicare Other

## 2020-02-04 ENCOUNTER — Ambulatory Visit: Payer: Medicare Other | Admitting: Speech Pathology

## 2020-02-04 ENCOUNTER — Inpatient Hospital Stay (HOSPITAL_COMMUNITY)
Admission: EM | Admit: 2020-02-04 | Discharge: 2020-02-08 | DRG: 872 | Disposition: A | Discharge status: Home / Self Care | Payer: Medicare Other | Attending: Internal Medicine | Admitting: Internal Medicine

## 2020-02-04 DIAGNOSIS — D72819 Decreased white blood cell count, unspecified: Secondary | ICD-10-CM | POA: Diagnosis present

## 2020-02-04 DIAGNOSIS — G500 Trigeminal neuralgia: Secondary | ICD-10-CM | POA: Diagnosis present

## 2020-02-04 DIAGNOSIS — R569 Unspecified convulsions: Secondary | ICD-10-CM | POA: Diagnosis present

## 2020-02-04 DIAGNOSIS — D649 Anemia, unspecified: Secondary | ICD-10-CM | POA: Diagnosis present

## 2020-02-04 DIAGNOSIS — M329 Systemic lupus erythematosus, unspecified: Secondary | ICD-10-CM | POA: Diagnosis present

## 2020-02-04 DIAGNOSIS — Z86718 Personal history of other venous thrombosis and embolism: Secondary | ICD-10-CM

## 2020-02-04 DIAGNOSIS — R509 Fever, unspecified: Secondary | ICD-10-CM

## 2020-02-04 DIAGNOSIS — Z8042 Family history of malignant neoplasm of prostate: Secondary | ICD-10-CM

## 2020-02-04 DIAGNOSIS — Z79899 Other long term (current) drug therapy: Secondary | ICD-10-CM

## 2020-02-04 DIAGNOSIS — A419 Sepsis, unspecified organism: Secondary | ICD-10-CM | POA: Diagnosis not present

## 2020-02-04 DIAGNOSIS — M542 Cervicalgia: Secondary | ICD-10-CM | POA: Diagnosis present

## 2020-02-04 DIAGNOSIS — J45909 Unspecified asthma, uncomplicated: Secondary | ICD-10-CM | POA: Diagnosis present

## 2020-02-04 DIAGNOSIS — N390 Urinary tract infection, site not specified: Secondary | ICD-10-CM | POA: Diagnosis not present

## 2020-02-04 DIAGNOSIS — R519 Headache, unspecified: Secondary | ICD-10-CM | POA: Diagnosis present

## 2020-02-04 DIAGNOSIS — N136 Pyonephrosis: Secondary | ICD-10-CM | POA: Diagnosis present

## 2020-02-04 DIAGNOSIS — Z7952 Long term (current) use of systemic steroids: Secondary | ICD-10-CM

## 2020-02-04 DIAGNOSIS — G8929 Other chronic pain: Secondary | ICD-10-CM | POA: Diagnosis present

## 2020-02-04 DIAGNOSIS — N179 Acute kidney failure, unspecified: Secondary | ICD-10-CM | POA: Diagnosis present

## 2020-02-04 DIAGNOSIS — R319 Hematuria, unspecified: Secondary | ICD-10-CM | POA: Diagnosis present

## 2020-02-04 DIAGNOSIS — Z20822 Contact with and (suspected) exposure to covid-19: Secondary | ICD-10-CM | POA: Diagnosis present

## 2020-02-04 DIAGNOSIS — Z833 Family history of diabetes mellitus: Secondary | ICD-10-CM

## 2020-02-04 DIAGNOSIS — B9620 Unspecified Escherichia coli [E. coli] as the cause of diseases classified elsewhere: Secondary | ICD-10-CM | POA: Diagnosis present

## 2020-02-04 DIAGNOSIS — Z803 Family history of malignant neoplasm of breast: Secondary | ICD-10-CM

## 2020-02-04 DIAGNOSIS — Z8249 Family history of ischemic heart disease and other diseases of the circulatory system: Secondary | ICD-10-CM

## 2020-02-04 DIAGNOSIS — Z8262 Family history of osteoporosis: Secondary | ICD-10-CM

## 2020-02-04 DIAGNOSIS — T451X5A Adverse effect of antineoplastic and immunosuppressive drugs, initial encounter: Secondary | ICD-10-CM | POA: Diagnosis present

## 2020-02-04 DIAGNOSIS — R5381 Other malaise: Secondary | ICD-10-CM | POA: Diagnosis present

## 2020-02-04 DIAGNOSIS — Z800 Family history of malignant neoplasm of digestive organs: Secondary | ICD-10-CM

## 2020-02-04 LAB — COMPREHENSIVE METABOLIC PANEL
ALT: 13 U/L (ref 0–44)
AST: 21 U/L (ref 15–41)
Albumin: 3.2 g/dL — ABNORMAL LOW (ref 3.5–5.0)
Alkaline Phosphatase: 78 U/L (ref 38–126)
Anion gap: 11 (ref 5–15)
BUN: 14 mg/dL (ref 6–20)
CO2: 20 mmol/L — ABNORMAL LOW (ref 22–32)
Calcium: 8.8 mg/dL — ABNORMAL LOW (ref 8.9–10.3)
Chloride: 110 mmol/L (ref 98–111)
Creatinine, Ser: 1.16 mg/dL — ABNORMAL HIGH (ref 0.44–1.00)
GFR calc Af Amer: >60 mL/min (ref >60)
GFR calc non Af Amer: 53 mL/min — ABNORMAL LOW (ref >60)
Glucose, Bld: 162 mg/dL — ABNORMAL HIGH (ref 70–99)
Potassium: 3.5 mmol/L (ref 3.5–5.1)
Sodium: 141 mmol/L (ref 135–145)
Total Bilirubin: 0.4 mg/dL (ref 0.3–1.2)
Total Protein: 6.1 g/dL — ABNORMAL LOW (ref 6.5–8.1)

## 2020-02-04 LAB — CBC
HCT: 36.1 % (ref 36.0–46.0)
Hemoglobin: 11.2 g/dL — ABNORMAL LOW (ref 12.0–15.0)
MCH: 29.7 pg (ref 26.0–34.0)
MCHC: 31 g/dL (ref 30.0–36.0)
MCV: 95.8 fL (ref 80.0–100.0)
Platelets: 156 10*3/uL (ref 150–400)
RBC: 3.77 MIL/uL — ABNORMAL LOW (ref 3.87–5.11)
RDW: 13.2 % (ref 11.5–15.5)
WBC: 5.1 10*3/uL (ref 4.0–10.5)
nRBC: 0 % (ref 0.0–0.2)

## 2020-02-04 LAB — URINALYSIS, ROUTINE W REFLEX MICROSCOPIC
Bilirubin Urine: NEGATIVE
Glucose, UA: NEGATIVE mg/dL
Ketones, ur: NEGATIVE mg/dL
Nitrite: NEGATIVE
Protein, ur: 100 mg/dL — AB
RBC / HPF: >50 RBC/hpf — ABNORMAL HIGH (ref 0–5)
Specific Gravity, Urine: 1.023 (ref 1.005–1.030)
WBC, UA: >50 WBC/hpf — ABNORMAL HIGH (ref 0–5)
pH: 5 (ref 5.0–8.0)

## 2020-02-04 LAB — ETHANOL: Alcohol, Ethyl (B): <10 mg/dL (ref <10)

## 2020-02-04 LAB — RAPID URINE DRUG SCREEN, HOSP PERFORMED
Amphetamines: NOT DETECTED
Barbiturates: NOT DETECTED
Benzodiazepines: NOT DETECTED
Cocaine: NOT DETECTED
Opiates: NOT DETECTED
Tetrahydrocannabinol: NOT DETECTED

## 2020-02-04 LAB — SARS CORONAVIRUS 2 BY RT PCR (HOSPITAL ORDER, PERFORMED IN ~~LOC~~ HOSPITAL LAB): SARS Coronavirus 2: NEGATIVE

## 2020-02-04 LAB — CBG MONITORING, ED: Glucose-Capillary: 142 mg/dL — ABNORMAL HIGH (ref 70–99)

## 2020-02-04 MED ORDER — SODIUM CHLORIDE 0.9 % IV BOLUS
500.0000 mL | Freq: Once | INTRAVENOUS | Status: AC
  Administered 2020-02-04: 500 mL via INTRAVENOUS

## 2020-02-04 MED ORDER — DIPHENHYDRAMINE HCL 50 MG/ML IJ SOLN
12.5000 mg | Freq: Once | INTRAMUSCULAR | Status: AC
  Administered 2020-02-04: 12.5 mg via INTRAVENOUS
  Filled 2020-02-04: qty 1

## 2020-02-04 MED ORDER — SODIUM CHLORIDE 0.9 % IV BOLUS
1000.0000 mL | Freq: Once | INTRAVENOUS | Status: AC
  Administered 2020-02-04: 1000 mL via INTRAVENOUS

## 2020-02-04 MED ORDER — PROCHLORPERAZINE EDISYLATE 10 MG/2ML IJ SOLN
5.0000 mg | Freq: Once | INTRAMUSCULAR | Status: AC
  Administered 2020-02-04: 5 mg via INTRAVENOUS
  Filled 2020-02-04: qty 2

## 2020-02-04 MED ORDER — SODIUM CHLORIDE 0.9 % IV SOLN
1.0000 g | Freq: Once | INTRAVENOUS | Status: AC
  Administered 2020-02-04: 1 g via INTRAVENOUS
  Filled 2020-02-04: qty 10

## 2020-02-04 NOTE — H&P (Signed)
History and Physical    Brittney Maddox [id number] DOB: June 16, 1966 DOA: 02/04/2020  PCP: Nicolette Bang, DO  Patient coming from:  [facility] I have personally briefly reviewed patient's old medical records in [facility]  Chief Complaint: sz like activity at home  HPI: Brittney Maddox is a 54 y.o. female with medical history significant of  Lupus associated with chronic debility, anemia, asthma, DVT not on anticoagulation, CAD s/p MI no intervention, RA ,leukopenia related to imuran toxicity, chronic occipital HA, who presents to ed hours after episode of seizure like activity at home described as arm and limb shaking w/o LOC that lasted for around 10 mins, without any preceeding prodrome or post ictal state. Per [facility] patient,was noted to have 2 further similar events.  At that time it was noted that patient had temperature of 103 and was given tylenol for treatment. Per patient see awoke this am with her usual HA but noted new left right flank pain, she states the episode see has which she describes as jerking and shaking of her extremities lasted around 10-15 mins. She note no prior episodes in the past. She denies any bowel or bladder incontinence and during episode had no LOC and post episode no altered state. She notes no dysuria but does endorse urgency. She denies any recent flare of her lupus, blood in her stool or urine.  Notes no nausea or abdominal pain or weight loss. She also neck pain associated with her chronic HA. She denies cough ,chest pain , but noted mild sob with episode.  ED Course:  IN ed  Vitals:  Temp 99, bp 107/57, hr 99, rr 23 , sta 99%  Labs wbc 5.1, hgb:11.2  Close to baseline, Utox/etoh :negative  Cr:1.16 base 0.76, bicarb 20  Patient ed course complicated by episode of hypotension with systolic of  Mid [id number]  Patient tx with ivfs with improvent  Infectious work  :neg ct , + UA  Lactate/procal pending  CT abd: noted right hydronephrosis w/o obstructing  stones or mention of pyelonephritis   Tx: ctx, ivfs bolus x  1524ml  Review of Systems: As per HPI otherwise 10 point review of systems negative.   Past Medical History:  Diagnosis Date  . Allergy   . Anemia   . Asthma   . Blood transfusion without reported diagnosis   . DVT (deep venous thrombosis) ([place])    2020- no blood thinners  . Lupus ([place])   . Myocardial infarction ([place])    "slight" per pt- 2014  . Rheumatoid arthritis ([place])   . Trigeminal neuralgia     Past Surgical History:  Procedure Laterality Date  . ABDOMINAL HYSTERECTOMY    . CESAREAN SECTION    . COLONOSCOPY     within 2 years- per pt "had a lot of polyps"  . NERVE AND TENDON REPAIR Right    right hand  . ROTATOR CUFF REPAIR Left   . STOMACH SURGERY     gastric sleeve     reports that she has never smoked. She has never used smokeless tobacco. She reports that she does not drink alcohol or use drugs.  No Known Allergies  Family History  Problem Relation Age of Onset  . Heart disease Mother   . Osteoporosis Mother   . Breast cancer Mother   . Prostate cancer Father   . Aneurysm Father   . Diabetes Father   . Migraines Father   . Colon cancer Father   .  Healthy Daughter   . Healthy Daughter   . Healthy Daughter   . Healthy Daughter   . Healthy Son   . Diabetes Brother   . Colon cancer Paternal Aunt   . Breast cancer Maternal Aunt   . Esophageal cancer Neg Hx   . Stomach cancer Neg Hx   . Rectal cancer Neg Hx    Prior to Admission medications   Medication Sig Start Date End Date Taking? Authorizing Provider  azaTHIOprine (IMURAN) 50 MG tablet Take 1 tablet by mouth daily. 11/04/19   [provider]  carbamazepine (TEGRETOL) 200 MG tablet TAKE 1 TABLET BY MOUTH THREE TIMES A DAY 12/16/19   Dohmeier, Asencion Partridge, MD  diclofenac Sodium (VOLTAREN) 1 % GEL APPLY 4 G TOPICALLY 4 (FOUR) TIMES DAILY. 01/10/20   Nicolette Bang, DO  gabapentin (NEURONTIN) 300 MG capsule Take 1 capsule (300  mg total) by mouth 3 (three) times daily. 11/18/19   Nicolette Bang, DO  hydroxychloroquine (PLAQUENIL) 200 MG tablet Take 1 tablet (200 mg total) by mouth 2 times daily. Do not take on saturdays and sundays 11/04/19   [provider]  Misc. Devices MISC Right AFO brace. diagnosis: Right foot drag 01/01/20   Nicolette Bang, DO  predniSONE (DELTASONE) 5 MG tablet Take 5 mg by mouth daily. 09/27/19   [provider]  tiZANidine (ZANAFLEX) 4 MG tablet Take 1 tablet (4 mg total) by mouth every 8 (eight) hours as needed for muscle spasms. 12/20/19   Nicolette Bang, DO  topiramate (TOPAMAX) 50 MG tablet Take 1 tablet (50 mg total) by mouth 2 (two) times daily. 12/12/19   Nicolette Bang, DO    Physical Exam: Vitals:   02/04/20 2215 02/04/20 2245 02/04/20 2300 02/04/20 2337  BP: (!) 89/55 (!) 76/51 (!) 76/47 98/66  Pulse: 77 77 72 69  Resp: (!) 21 (!) 24 14 20   Temp:      TempSrc:      SpO2: 99% 100% 99% 100%  Weight:      Height:        Constitutional: NAD, calm, comfortable Vitals:   02/04/20 2215 02/04/20 2245 02/04/20 2300 02/04/20 2337  BP: (!) 89/55 (!) 76/51 (!) 76/47 98/66  Pulse: 77 77 72 69  Resp: (!) 21 (!) 24 14 20   Temp:      TempSrc:      SpO2: 99% 100% 99% 100%  Weight:      Height:       Eyes: PERRL, lids and conjunctivae normal ENMT: Mucous membranes are dry. Posterior pharynx clear of any exudate or lesions.Normal dentition.  Neck: normal, supple, no masses, no thyromegaly Respiratory: clear to auscultation bilaterally, no wheezing, no crackles. Normal respiratory effort. No accessory muscle use.  Cardiovascular: Regular rate and rhythm, no murmurs / rubs / gallops. No extremity edema. 2+ pedal pulses. No carotid bruits.  Abdomen: no tenderness, no masses palpated. No hepatosplenomegaly. Bowel sounds positive.  Musculoskeletal: no clubbing / cyanosis. No joint deformity upper and lower extremities. Good ROM, no  contractures. Normal muscle tone. + flank pain on the right Skin: no rashes, lesions, ulcers. No induration Neurologic: CN 2-12 grossly intact. Sensation intact,. Strength 5/5 in all 4.  Psychiatric: Normal judgment and insight. Alert and oriented x 3. Normal mood.    Labs on Admission: I have personally reviewed following labs and imaging studies  CBC: Recent Labs  Lab 02/04/20 1815  WBC 5.1  HGB 11.2*  HCT 36.1  MCV  95.8  PLT [id number]   Basic Metabolic Panel: Recent Labs  Lab 02/04/20 1815  NA 141  K 3.5  CL 110  CO2 20*  GLUCOSE 162*  BUN 14  CREATININE 1.16*  CALCIUM 8.8*   GFR: Estimated Creatinine Clearance: 45.4 mL/min (A) (by C-G formula based on SCr of 1.16 mg/dL (H)). Liver Function Tests: Recent Labs  Lab 02/04/20 1815  AST 21  ALT 13  ALKPHOS 78  BILITOT 0.4  PROT 6.1*  ALBUMIN 3.2*   No results for input(s): LIPASE, AMYLASE in the last 168 hours. No results for input(s): AMMONIA in the last 168 hours. Coagulation Profile: No results for input(s): INR, PROTIME in the last 168 hours. Cardiac Enzymes: No results for input(s): CKTOTAL, CKMB, CKMBINDEX, TROPONINI in the last 168 hours. BNP (last 3 results) No results for input(s): PROBNP in the last 8760 hours. HbA1C: No results for input(s): HGBA1C in the last 72 hours. CBG: Recent Labs  Lab 02/04/20 1801  GLUCAP 142*   Lipid Profile: No results for input(s): CHOL, HDL, LDLCALC, TRIG, CHOLHDL, LDLDIRECT in the last 72 hours. Thyroid Function Tests: No results for input(s): TSH, T4TOTAL, FREET4, T3FREE, THYROIDAB in the last 72 hours. Anemia Panel: No results for input(s): VITAMINB12, FOLATE, FERRITIN, TIBC, IRON, RETICCTPCT in the last 72 hours. Urine analysis:    Component Value Date/Time   COLORURINE YELLOW 02/04/2020 2038   APPEARANCEUR TURBID (A) 02/04/2020 2038   LABSPEC 1.023 02/04/2020 2038   PHURINE 5.0 02/04/2020 2038   GLUCOSEU NEGATIVE 02/04/2020 2038   HGBUR MODERATE (A)  02/04/2020 2038   BILIRUBINUR NEGATIVE 02/04/2020 2038   BILIRUBINUR neg 08/20/2013 [place] 02/04/2020 2038   PROTEINUR 100 (A) 02/04/2020 2038   UROBILINOGEN 0.2 08/20/2013 1146   UROBILINOGEN 0.2 07/12/2013 1536   NITRITE NEGATIVE 02/04/2020 2038   LEUKOCYTESUR MODERATE (A) 02/04/2020 2038    Radiological Exams on Admission: CT Head Wo Contrast  Result Date: 02/04/2020 CLINICAL DATA:  New onset seizure EXAM: CT HEAD WITHOUT CONTRAST TECHNIQUE: Contiguous axial images were obtained from the base of the skull through the vertex without intravenous contrast. COMPARISON:  None. FINDINGS: Brain: There is no acute intracranial hemorrhage, mass effect, or edema. Gray-white differentiation is preserved. There is no extra-axial fluid collection. Ventricles and sulci are within normal limits in size and configuration. Vascular: No hyperdense vessel or unexpected calcification. Skull: Calvarium is unremarkable. Sinuses/Orbits: No acute finding. Other: None. IMPRESSION: No acute intracranial hemorrhage, mass effect, or evidence of acute infarction. Electronically Signed   By: Macy Mis M.D.   On: 02/04/2020 20:24   DG Chest Portable 1 View  Result Date: 02/04/2020 CLINICAL DATA:  Seizure fever EXAM: PORTABLE CHEST 1 VIEW COMPARISON:  08/05/2019 FINDINGS: The heart size and mediastinal contours are within normal limits. Both lungs are clear. The visualized skeletal structures are unremarkable. IMPRESSION: No active disease. Electronically Signed   By: Donavan Foil M.D.   On: 02/04/2020 19:56   CT Renal Stone Study  Result Date: 02/04/2020 CLINICAL DATA:  Flank pain, history of lupus EXAM: CT ABDOMEN AND PELVIS WITHOUT CONTRAST TECHNIQUE: Multidetector CT imaging of the abdomen and pelvis was performed following the standard protocol without IV contrast. COMPARISON:  None. FINDINGS: Lower chest: No acute pleural or parenchymal lung disease. Hepatobiliary: No focal liver abnormality  is seen. No gallstones, gallbladder wall thickening, or biliary dilatation. Pancreas: Unremarkable. No pancreatic ductal dilatation or surrounding inflammatory changes. Spleen: Normal in size without focal abnormality. Adrenals/Urinary Tract: There is  mild right-sided hydronephrosis without clear obstructing calculus. The left kidney is unremarkable. Bladder is moderately distended with no focal abnormality. The adrenals are unremarkable. Stomach/Bowel: No bowel obstruction or ileus. Normal appendix is seen in the right lower quadrant. No bowel wall thickening or inflammatory change. Postsurgical changes are seen from prior gastric surgery. Vascular/Lymphatic: Aortic atherosclerosis. Numerous phleboliths are seen within the pelvis. No enlarged abdominal or pelvic lymph nodes. Reproductive: Status post hysterectomy. No adnexal masses. Other: No abdominal wall hernia or abnormality. No abdominopelvic ascites. Musculoskeletal: No acute or destructive bony lesions. Reconstructed images demonstrate no additional findings. IMPRESSION: 1. Mild right-sided hydronephrosis without clear obstructing calculus. 2. Otherwise unremarkable unenhanced exam. Electronically Signed   By: Randa Ngo M.D.   On: 02/04/2020 22:44    EKG: Independently reviewed.   Assessment/Plan  Sepsis presumed due to UTI/acute pyleonephritis  -broad spectrum abx  -f/u on culture data  -goal directed fluid administration  -continue to cycle lactate per protocol  -f/u on inflammatory markers -f/u on cultures  ?Seizure activity  -doubt actual sz , most likely rigors related to infection with temp in field of 103  -however will place PT neuro checks and sz precuations -CT head negative   AKI  -ivfs over night /thought to be due to dehydration  -hold nephrotoxic meds   Recent history of Neuro complaints -per pcp notes increased debility, word finding difficulties  -MRI head ordered due to patient recent past history of confusion  and increasing debility , in back ground of Lupus, r/o lupus cerebritis  -of note current changes could be related to medication side effect of topamax or due to symptoms from complicated migraines however to be complete will order MRI of head   Lupus associated with chronic debility -on imuran , hold for now due to concern for sepsis  -continue chronic prednisone    Anemia -stable -most likely related to ACD   Asthma -no active exacerbation  -prn nebs  -not on controller medications   Hx DVT  -not currently on anticoagulation  Hx of abnormal EKG stress test  -cath per patient noted clean c's   Lupus/ RA -no current symptoms of flare -continue prednisone  -resume imuran once infection stablizes    Leukopenia related to imuran toxicity -resolved   Chronic occipital HA/migrane /TBI -on Topamax and Tegretol   FEN: Electrolytes  Mild low bicarb  Continue to monitor ivfs   DVT prophylaxis:  Heparin Code Status:Full Family Communication:  N/a Disposition Plan: 3-4 days  Consults called: none Admission status: inpatient    Clance Boll MD [facility]  If 7PM-7AM, please contact night-coverage [email or web address] Password [facility]  02/04/2020, 11:52 PM

## 2020-02-04 NOTE — ED Triage Notes (Signed)
Pt arrives via EMS from home with seizure like activity around 1 pm.Per family, extermitity shaking for 10 mins, denies LOC and able to recall events. 2 witnessed by EMS, focal with left upper ext shaking. EMS reports temp of 103 and given 1 g tylenol. Hx of TBI but no seizures.

## 2020-02-04 NOTE — ED Provider Notes (Signed)
[facility] EMERGENCY DEPARTMENT Provider Note   CSN: [id number] Arrival date & time: 02/04/20  1754     History Chief Complaint  Patient presents with  . Seizures    Brittney Maddox is a 54 y.o. female.  Presents to ER with myriad complaints.  Her chief complaint was new onset of seizures.  States around 1:00 thinks she may have had a seizure-like episode, family reported that she had shaking for around 10 minutes in her upper and lower legs.  Patient denied any loss of consciousness and was able to recall events.  Patient states that she went to an urgent care but was told that they could not help her so she went home.  Then EMS was called once patient was home.  They reported that she had shaking of her left upper extremity, there was no change in consciousness.  She was not given any antiepileptics.  EMS also reported temp of 103 and gave 1 g Tylenol.  Patient was unaware that she had fever until EMS checked.  States that she has had some right flank pain today today as well.  No dysuria or hematuria.  On review systems, patient also versus that she has been having headache today, sharp, stabbing, frontal.  Has had similar headaches in the past, seems to be more severe than normal.  Not sudden onset.  Not worsened or related.  She is having some neck aching sensation, but denies any neck stiffness.  HPI     Past Medical History:  Diagnosis Date  . Allergy   . Anemia   . Asthma   . Blood transfusion without reported diagnosis   . DVT (deep venous thrombosis) ([place])    2020- no blood thinners  . Lupus ([place])   . Myocardial infarction ([place])    "slight" per pt- 2014  . Rheumatoid arthritis ([place])   . Trigeminal neuralgia     Patient Active Problem List   Diagnosis Date Noted  . Sepsis ([place]) 02/05/2020  . 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 Cervicalgia 06/11/2019  . Right trigeminal neuralgia 06/11/2019  . Prediabetes 08/20/2013  . Elevated LFTs  08/20/2013  . Fatty liver 08/20/2013  . Vaginitis and vulvovaginitis 07/12/2013  . 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 Lupus (systemic lupus erythematosus) ([place]) 06/24/2013    Past Surgical History:  Procedure Laterality Date  . ABDOMINAL HYSTERECTOMY    . CESAREAN SECTION    . COLONOSCOPY     within 2 years- per pt "had a lot of polyps"  . NERVE AND TENDON REPAIR Right    right hand  . ROTATOR CUFF REPAIR Left   . STOMACH SURGERY     gastric sleeve     OB History   No obstetric history on file.     Family History  Problem Relation Age of Onset  . Heart disease Mother   . Osteoporosis Mother   . Breast cancer Mother   . Prostate cancer Father   . Aneurysm Father   . Diabetes Father   . Migraines Father   . Colon cancer Father   . Healthy Daughter   . Healthy Daughter   . Healthy Daughter   . Healthy Daughter   . Healthy Son   . Diabetes Brother   . Colon cancer Paternal Aunt   . Breast cancer Maternal Aunt   . Esophageal cancer Neg Hx   . Stomach cancer Neg Hx   . Rectal cancer Neg Hx     Social  History   Tobacco Use  . Smoking status: Never Smoker  . Smokeless tobacco: Never Used  Substance Use Topics  . Alcohol use: No  . Drug use: No    Home Medications Prior to Admission medications   Medication Sig Start Date End Date Taking? Authorizing Provider  azaTHIOprine (IMURAN) 50 MG tablet Take 1 tablet by mouth daily. 11/04/19   [provider]  carbamazepine (TEGRETOL) 200 MG tablet TAKE 1 TABLET BY MOUTH THREE TIMES A DAY 12/16/19   Dohmeier, Asencion Partridge, MD  diclofenac Sodium (VOLTAREN) 1 % GEL APPLY 4 G TOPICALLY 4 (FOUR) TIMES DAILY. 01/10/20   Nicolette Bang, DO  gabapentin (NEURONTIN) 300 MG capsule Take 1 capsule (300 mg total) by mouth 3 (three) times daily. 11/18/19   Nicolette Bang, DO  hydroxychloroquine (PLAQUENIL) 200 MG tablet Take 1 tablet (200 mg total) by mouth 2 times daily. Do not take on saturdays and sundays 11/04/19    [provider]  Misc. Devices MISC Right AFO brace. diagnosis: Right foot drag 01/01/20   Nicolette Bang, DO  predniSONE (DELTASONE) 5 MG tablet Take 5 mg by mouth daily. 09/27/19   [provider]  tiZANidine (ZANAFLEX) 4 MG tablet Take 1 tablet (4 mg total) by mouth every 8 (eight) hours as needed for muscle spasms. 12/20/19   Nicolette Bang, DO  topiramate (TOPAMAX) 50 MG tablet Take 1 tablet (50 mg total) by mouth 2 (two) times daily. 12/12/19   Nicolette Bang, DO    Allergies    Patient has no known allergies.  Review of Systems   Review of Systems  Constitutional: Positive for fever. Negative for chills.  HENT: Negative for ear pain and sore throat.   Eyes: Negative for pain and visual disturbance.  Respiratory: Negative for cough and shortness of breath.   Cardiovascular: Negative for chest pain and palpitations.  Gastrointestinal: Negative for abdominal pain and vomiting.  Genitourinary: Negative for dysuria and hematuria.  Musculoskeletal: Positive for back pain. Negative for arthralgias.  Skin: Negative for color change and rash.  Neurological: Positive for seizures. Negative for syncope.  All other systems reviewed and are negative.   Physical Exam Updated Vital Signs BP 99/69   Pulse 68   Temp 99 F (37.2 C) (Oral)   Resp 17   Ht 5' (1.524 m)   Wt 64.4 kg   SpO2 100%   BMI 27.73 kg/m   Physical Exam Vitals and nursing note reviewed.  Constitutional:      General: She is not in acute distress.    Appearance: She is well-developed.  HENT:     Head: Normocephalic and atraumatic.     Mouth/Throat:     Mouth: Mucous membranes are moist.  Eyes:     Conjunctiva/sclera: Conjunctivae normal.  Cardiovascular:     Rate and Rhythm: Normal rate and regular rhythm.     Heart sounds: No murmur.  Pulmonary:     Effort: Pulmonary effort is normal. No respiratory distress.     Breath sounds: Normal breath sounds.    Abdominal:     Palpations: Abdomen is soft.     Tenderness: There is no abdominal tenderness.  Musculoskeletal:        General: No deformity or signs of injury.     Cervical back: Normal range of motion and neck supple. No rigidity or tenderness.     Comments: Right CVA tenderness  Lymphadenopathy:     Cervical: No cervical adenopathy.  Skin:  General: Skin is warm and dry.     Capillary Refill: Capillary refill takes less than 2 seconds.  Neurological:     General: No focal deficit present.     Mental Status: She is alert and oriented to person, place, and time.     Cranial Nerves: No cranial nerve deficit.     Motor: No weakness.  Psychiatric:        Mood and Affect: Mood normal.        Behavior: Behavior normal.     ED Results / Procedures / Treatments   Labs (all labs ordered are listed, but only abnormal results are displayed) Labs Reviewed  CBC - Abnormal; Notable for the following components:      Result Value   RBC 3.77 (*)    Hemoglobin 11.2 (*)    All other components within normal limits  COMPREHENSIVE METABOLIC PANEL - Abnormal; Notable for the following components:   CO2 20 (*)    Glucose, Bld 162 (*)    Creatinine, Ser 1.16 (*)    Calcium 8.8 (*)    Total Protein 6.1 (*)    Albumin 3.2 (*)    GFR calc non Af Amer 53 (*)    All other components within normal limits  URINALYSIS, ROUTINE W REFLEX MICROSCOPIC - Abnormal; Notable for the following components:   APPearance TURBID (*)    Hgb urine dipstick MODERATE (*)    Protein, ur 100 (*)    Leukocytes,Ua MODERATE (*)    RBC / HPF >50 (*)    WBC, UA >50 (*)    Bacteria, UA FEW (*)    All other components within normal limits  CBG MONITORING, ED - Abnormal; Notable for the following components:   Glucose-Capillary 142 (*)    All other components within normal limits  SARS CORONAVIRUS 2 BY RT PCR (HOSPITAL ORDER, [facility])  URINE CULTURE  CULTURE, BLOOD (ROUTINE X 2)   CULTURE, BLOOD (ROUTINE X 2)  URINE CULTURE  ETHANOL  RAPID URINE DRUG SCREEN, HOSP PERFORMED  LACTIC ACID, PLASMA  LACTIC ACID, PLASMA  CBC  CREATININE, SERUM  TSH  COMPREHENSIVE METABOLIC PANEL  CBC  BLOOD GAS, ARTERIAL  PROCALCITONIN    EKG EKG Interpretation  Date/Time:  Tuesday Feb 04 2020 17:56:36 EDT Ventricular Rate:  101 PR Interval:    QRS Duration: 84 QT Interval:  321 QTC Calculation: 416 R Axis:   28 Text Interpretation: Sinus tachycardia Borderline T abnormalities, anterior leads Confirmed by Madalyn Rob [phone]) on 02/04/2020 6:59:16 PM   Radiology CT Head Wo Contrast  Result Date: 02/04/2020 CLINICAL DATA:  New onset seizure EXAM: CT HEAD WITHOUT CONTRAST TECHNIQUE: Contiguous axial images were obtained from the base of the skull through the vertex without intravenous contrast. COMPARISON:  None. FINDINGS: Brain: There is no acute intracranial hemorrhage, mass effect, or edema. Gray-white differentiation is preserved. There is no extra-axial fluid collection. Ventricles and sulci are within normal limits in size and configuration. Vascular: No hyperdense vessel or unexpected calcification. Skull: Calvarium is unremarkable. Sinuses/Orbits: No acute finding. Other: None. IMPRESSION: No acute intracranial hemorrhage, mass effect, or evidence of acute infarction. Electronically Signed   By: Macy Mis M.D.   On: 02/04/2020 20:24   DG Chest Portable 1 View  Result Date: 02/04/2020 CLINICAL DATA:  Seizure fever EXAM: PORTABLE CHEST 1 VIEW COMPARISON:  08/05/2019 FINDINGS: The heart size and mediastinal contours are within normal limits. Both lungs are clear. The visualized skeletal  structures are unremarkable. IMPRESSION: No active disease. Electronically Signed   By: Donavan Foil M.D.   On: 02/04/2020 19:56   CT Renal Stone Study  Result Date: 02/04/2020 CLINICAL DATA:  Flank pain, history of lupus EXAM: CT ABDOMEN AND PELVIS WITHOUT CONTRAST TECHNIQUE:  Multidetector CT imaging of the abdomen and pelvis was performed following the standard protocol without IV contrast. COMPARISON:  None. FINDINGS: Lower chest: No acute pleural or parenchymal lung disease. Hepatobiliary: No focal liver abnormality is seen. No gallstones, gallbladder wall thickening, or biliary dilatation. Pancreas: Unremarkable. No pancreatic ductal dilatation or surrounding inflammatory changes. Spleen: Normal in size without focal abnormality. Adrenals/Urinary Tract: There is mild right-sided hydronephrosis without clear obstructing calculus. The left kidney is unremarkable. Bladder is moderately distended with no focal abnormality. The adrenals are unremarkable. Stomach/Bowel: No bowel obstruction or ileus. Normal appendix is seen in the right lower quadrant. No bowel wall thickening or inflammatory change. Postsurgical changes are seen from prior gastric surgery. Vascular/Lymphatic: Aortic atherosclerosis. Numerous phleboliths are seen within the pelvis. No enlarged abdominal or pelvic lymph nodes. Reproductive: Status post hysterectomy. No adnexal masses. Other: No abdominal wall hernia or abnormality. No abdominopelvic ascites. Musculoskeletal: No acute or destructive bony lesions. Reconstructed images demonstrate no additional findings. IMPRESSION: 1. Mild right-sided hydronephrosis without clear obstructing calculus. 2. Otherwise unremarkable unenhanced exam. Electronically Signed   By: Randa Ngo M.D.   On: 02/04/2020 22:44    Procedures Procedures (including critical care time)  Medications Ordered in ED Medications  hydroxychloroquine (PLAQUENIL) tablet 200 mg (has no administration in time range)  predniSONE (DELTASONE) tablet 5 mg (has no administration in time range)  azaTHIOprine (IMURAN) tablet 50 mg (has no administration in time range)  carbamazepine (TEGRETOL) tablet 200 mg (has no administration in time range)  gabapentin (NEURONTIN) capsule 300 mg (has no  administration in time range)  topiramate (TOPAMAX) tablet 50 mg (has no administration in time range)  diclofenac Sodium (VOLTAREN) 1 % topical gel 2 g (has no administration in time range)  heparin injection 5,000 Units (has no administration in time range)  0.9 %  sodium chloride infusion (has no administration in time range)  acetaminophen (TYLENOL) tablet 650 mg (has no administration in time range)    Or  acetaminophen (TYLENOL) suppository 650 mg (has no administration in time range)  ondansetron (ZOFRAN) tablet 4 mg (has no administration in time range)    Or  ondansetron (ZOFRAN) injection 4 mg (has no administration in time range)  albuterol (PROVENTIL) (2.5 MG/3ML) 0.083% nebulizer solution 2.5 mg (has no administration in time range)  sodium chloride 0.9 % bolus 1,000 mL (has no administration in time range)  cefTRIAXone (ROCEPHIN) 2 g in sodium chloride 0.9 % 100 mL IVPB (has no administration in time range)  sodium chloride 0.9 % bolus 1,000 mL (0 mLs Intravenous Stopped 02/04/20 2201)  cefTRIAXone (ROCEPHIN) 1 g in sodium chloride 0.9 % 100 mL IVPB (0 g Intravenous Stopped 02/04/20 2247)  sodium chloride 0.9 % bolus 1,000 mL (0 mLs Intravenous Stopped 02/04/20 2346)  diphenhydrAMINE (BENADRYL) injection 12.5 mg (12.5 mg Intravenous Given 02/04/20 2200)  prochlorperazine (COMPAZINE) injection 5 mg (5 mg Intravenous Given 02/04/20 2200)  sodium chloride 0.9 % bolus 500 mL (500 mLs Intravenous New Bag/Given 02/04/20 2345)    ED Course  I have reviewed the triage vital signs and the nursing notes.  Pertinent labs & imaging results that were available during my care of the patient were reviewed by me and considered  in my medical decision making (see chart for details).    MDM Rules/Calculators/A&P                      54 year old lady came to ER with concern for seizure episode that occurred around 1:00 this afternoon.  Here patient without neuro deficit, no ongoing neurologic  complaints.  CT head was negative.  Based on description of episode, unclear if this was true epileptic episode.  Patient also had fever 103 per EMS though she was afebrile here.  While in ER patient noted to have intermittent low blood pressures, though patient reports low blood pressure, concern for possible sepsis.  UA with UTI.  Had some flank pain, CT negative for any stone disease, did have some mild right nephrosis.  Suspect patient may have pyelonephritis.  Pt had report of headache, but no neck stiffness, no meningismus, clinically currently very low suspicion for CNS infection. Provided fluids, abx for UTI.  BP improved.  Believe she would benefit from additional observation, IV abx and monitoring overnight.   Discussed with Dr. Marcello Moores with [facility] who will admit for further management.    Final Clinical Impression(s) / ED Diagnoses Final diagnoses:  Seizure-like activity ([place])  Fever, unspecified fever cause  Urinary tract infection with hematuria, site unspecified    Rx / DC Orders ED Discharge Orders    None       Lucrezia Starch, MD 02/05/20 0045

## 2020-02-04 NOTE — ED Notes (Signed)
Urine culture sent down with UA. 

## 2020-02-05 ENCOUNTER — Inpatient Hospital Stay (HOSPITAL_COMMUNITY): Payer: Medicare Other

## 2020-02-05 DIAGNOSIS — N179 Acute kidney failure, unspecified: Secondary | ICD-10-CM | POA: Diagnosis present

## 2020-02-05 DIAGNOSIS — Z8042 Family history of malignant neoplasm of prostate: Secondary | ICD-10-CM | POA: Diagnosis not present

## 2020-02-05 DIAGNOSIS — Z20822 Contact with and (suspected) exposure to covid-19: Secondary | ICD-10-CM | POA: Diagnosis present

## 2020-02-05 DIAGNOSIS — B9620 Unspecified Escherichia coli [E. coli] as the cause of diseases classified elsewhere: Secondary | ICD-10-CM | POA: Diagnosis present

## 2020-02-05 DIAGNOSIS — E876 Hypokalemia: Secondary | ICD-10-CM | POA: Diagnosis not present

## 2020-02-05 DIAGNOSIS — G500 Trigeminal neuralgia: Secondary | ICD-10-CM | POA: Diagnosis present

## 2020-02-05 DIAGNOSIS — D649 Anemia, unspecified: Secondary | ICD-10-CM

## 2020-02-05 DIAGNOSIS — Z86718 Personal history of other venous thrombosis and embolism: Secondary | ICD-10-CM | POA: Diagnosis not present

## 2020-02-05 DIAGNOSIS — M329 Systemic lupus erythematosus, unspecified: Secondary | ICD-10-CM | POA: Diagnosis present

## 2020-02-05 DIAGNOSIS — N10 Acute tubulo-interstitial nephritis: Secondary | ICD-10-CM | POA: Diagnosis not present

## 2020-02-05 DIAGNOSIS — Z803 Family history of malignant neoplasm of breast: Secondary | ICD-10-CM | POA: Diagnosis not present

## 2020-02-05 DIAGNOSIS — J45909 Unspecified asthma, uncomplicated: Secondary | ICD-10-CM | POA: Diagnosis present

## 2020-02-05 DIAGNOSIS — D72819 Decreased white blood cell count, unspecified: Secondary | ICD-10-CM | POA: Diagnosis present

## 2020-02-05 DIAGNOSIS — N136 Pyonephrosis: Secondary | ICD-10-CM | POA: Diagnosis present

## 2020-02-05 DIAGNOSIS — R519 Headache, unspecified: Secondary | ICD-10-CM | POA: Diagnosis present

## 2020-02-05 DIAGNOSIS — Z7952 Long term (current) use of systemic steroids: Secondary | ICD-10-CM | POA: Diagnosis not present

## 2020-02-05 DIAGNOSIS — Z8249 Family history of ischemic heart disease and other diseases of the circulatory system: Secondary | ICD-10-CM | POA: Diagnosis not present

## 2020-02-05 DIAGNOSIS — A419 Sepsis, unspecified organism: Secondary | ICD-10-CM | POA: Diagnosis present

## 2020-02-05 DIAGNOSIS — G8929 Other chronic pain: Secondary | ICD-10-CM | POA: Diagnosis present

## 2020-02-05 DIAGNOSIS — Z79899 Other long term (current) drug therapy: Secondary | ICD-10-CM | POA: Diagnosis not present

## 2020-02-05 DIAGNOSIS — Z8262 Family history of osteoporosis: Secondary | ICD-10-CM | POA: Diagnosis not present

## 2020-02-05 DIAGNOSIS — R569 Unspecified convulsions: Secondary | ICD-10-CM | POA: Diagnosis present

## 2020-02-05 DIAGNOSIS — Z833 Family history of diabetes mellitus: Secondary | ICD-10-CM | POA: Diagnosis not present

## 2020-02-05 DIAGNOSIS — Z800 Family history of malignant neoplasm of digestive organs: Secondary | ICD-10-CM | POA: Diagnosis not present

## 2020-02-05 DIAGNOSIS — R5381 Other malaise: Secondary | ICD-10-CM | POA: Diagnosis present

## 2020-02-05 DIAGNOSIS — T451X5A Adverse effect of antineoplastic and immunosuppressive drugs, initial encounter: Secondary | ICD-10-CM | POA: Diagnosis present

## 2020-02-05 LAB — POCT I-STAT 7, (LYTES, BLD GAS, ICA,H+H)
Acid-base deficit: 6 mmol/L — ABNORMAL HIGH (ref 0.0–2.0)
Bicarbonate: 19 mmol/L — ABNORMAL LOW (ref 20.0–28.0)
Calcium, Ion: 1.26 mmol/L (ref 1.15–1.40)
HCT: 27 % — ABNORMAL LOW (ref 36.0–46.0)
Hemoglobin: 9.2 g/dL — ABNORMAL LOW (ref 12.0–15.0)
O2 Saturation: 97 %
Patient temperature: 98.6
Potassium: 3.5 mmol/L (ref 3.5–5.1)
Sodium: 143 mmol/L (ref 135–145)
TCO2: 20 mmol/L — ABNORMAL LOW (ref 22–32)
pCO2 arterial: 34.5 mmHg (ref 32.0–48.0)
pH, Arterial: 7.349 — ABNORMAL LOW (ref 7.350–7.450)
pO2, Arterial: 96 mmHg (ref 83.0–108.0)

## 2020-02-05 LAB — CBC
HCT: 32.3 % — ABNORMAL LOW (ref 36.0–46.0)
Hemoglobin: 9.9 g/dL — ABNORMAL LOW (ref 12.0–15.0)
MCH: 30.4 pg (ref 26.0–34.0)
MCHC: 30.7 g/dL (ref 30.0–36.0)
MCV: 99.1 fL (ref 80.0–100.0)
Platelets: 126 10*3/uL — ABNORMAL LOW (ref 150–400)
RBC: 3.26 MIL/uL — ABNORMAL LOW (ref 3.87–5.11)
RDW: 13.5 % (ref 11.5–15.5)
WBC: 4.3 10*3/uL (ref 4.0–10.5)
nRBC: 0 % (ref 0.0–0.2)

## 2020-02-05 LAB — COMPREHENSIVE METABOLIC PANEL
ALT: 16 U/L (ref 0–44)
AST: 29 U/L (ref 15–41)
Albumin: 2.6 g/dL — ABNORMAL LOW (ref 3.5–5.0)
Alkaline Phosphatase: 70 U/L (ref 38–126)
Anion gap: 9 (ref 5–15)
BUN: 11 mg/dL (ref 6–20)
CO2: 18 mmol/L — ABNORMAL LOW (ref 22–32)
Calcium: 7.9 mg/dL — ABNORMAL LOW (ref 8.9–10.3)
Chloride: 117 mmol/L — ABNORMAL HIGH (ref 98–111)
Creatinine, Ser: 0.8 mg/dL (ref 0.44–1.00)
GFR calc Af Amer: >60 mL/min (ref >60)
GFR calc non Af Amer: >60 mL/min (ref >60)
Glucose, Bld: 87 mg/dL (ref 70–99)
Potassium: 3.6 mmol/L (ref 3.5–5.1)
Sodium: 144 mmol/L (ref 135–145)
Total Bilirubin: 0.3 mg/dL (ref 0.3–1.2)
Total Protein: 5.2 g/dL — ABNORMAL LOW (ref 6.5–8.1)

## 2020-02-05 LAB — LACTIC ACID, PLASMA
Lactic Acid, Venous: 0.8 mmol/L (ref 0.5–1.9)
Lactic Acid, Venous: 0.9 mmol/L (ref 0.5–1.9)

## 2020-02-05 LAB — CREATININE, SERUM
Creatinine, Ser: 0.78 mg/dL (ref 0.44–1.00)
GFR calc Af Amer: >60 mL/min (ref >60)
GFR calc non Af Amer: >60 mL/min (ref >60)

## 2020-02-05 LAB — TSH: TSH: 0.808 u[IU]/mL (ref 0.350–4.500)

## 2020-02-05 LAB — PROCALCITONIN: Procalcitonin: 8.52 ng/mL

## 2020-02-05 MED ORDER — SODIUM CHLORIDE 0.9 % IV SOLN
1.0000 g | INTRAVENOUS | Status: DC
  Administered 2020-02-05 – 2020-02-06 (×2): 1 g via INTRAVENOUS
  Filled 2020-02-05 (×2): qty 10

## 2020-02-05 MED ORDER — ONDANSETRON HCL 4 MG PO TABS: 4.0000 mg | ORAL_TABLET | Freq: Four times a day (QID) | ORAL | Status: DC | PRN

## 2020-02-05 MED ORDER — SODIUM CHLORIDE 0.9 % IV SOLN: INTRAVENOUS | Status: DC

## 2020-02-05 MED ORDER — TOPIRAMATE 25 MG PO TABS
50.0000 mg | ORAL_TABLET | Freq: Two times a day (BID) | ORAL | Status: DC
  Administered 2020-02-05 – 2020-02-08 (×8): 50 mg via ORAL
  Filled 2020-02-05 (×8): qty 2

## 2020-02-05 MED ORDER — GABAPENTIN 300 MG PO CAPS
300.0000 mg | ORAL_CAPSULE | Freq: Three times a day (TID) | ORAL | Status: DC
  Administered 2020-02-05 – 2020-02-08 (×10): 300 mg via ORAL
  Filled 2020-02-05 (×4): qty 1
  Filled 2020-02-05: qty 3
  Filled 2020-02-05 (×3): qty 1
  Filled 2020-02-05: qty 3
  Filled 2020-02-05: qty 1

## 2020-02-05 MED ORDER — SODIUM CHLORIDE 0.9 % IV BOLUS
1000.0000 mL | Freq: Once | INTRAVENOUS | Status: AC
  Administered 2020-02-05: 1000 mL via INTRAVENOUS

## 2020-02-05 MED ORDER — HEPARIN SODIUM (PORCINE) 5000 UNIT/ML IJ SOLN
5000.0000 [IU] | Freq: Three times a day (TID) | INTRAMUSCULAR | Status: DC
  Administered 2020-02-05 – 2020-02-08 (×10): 5000 [IU] via SUBCUTANEOUS
  Filled 2020-02-05 (×10): qty 1

## 2020-02-05 MED ORDER — TIZANIDINE HCL 4 MG PO TABS: 4.0000 mg | ORAL_TABLET | Freq: Three times a day (TID) | ORAL | Status: DC | PRN

## 2020-02-05 MED ORDER — ACETAMINOPHEN 325 MG PO TABS: 650.0000 mg | ORAL_TABLET | Freq: Four times a day (QID) | ORAL | Status: DC | PRN

## 2020-02-05 MED ORDER — AZATHIOPRINE 50 MG PO TABS
50.0000 mg | ORAL_TABLET | Freq: Every day | ORAL | Status: DC
  Filled 2020-02-05: qty 1

## 2020-02-05 MED ORDER — ALBUTEROL SULFATE (2.5 MG/3ML) 0.083% IN NEBU: 2.5000 mg | INHALATION_SOLUTION | RESPIRATORY_TRACT | Status: DC | PRN

## 2020-02-05 MED ORDER — HYDROCORTISONE NA SUCCINATE PF 100 MG IJ SOLR
100.0000 mg | Freq: Three times a day (TID) | INTRAMUSCULAR | Status: DC
  Administered 2020-02-05 – 2020-02-07 (×7): 100 mg via INTRAVENOUS
  Filled 2020-02-05 (×7): qty 2

## 2020-02-05 MED ORDER — PREDNISONE 5 MG PO TABS: 5.0000 mg | ORAL_TABLET | Freq: Every day | ORAL | Status: DC

## 2020-02-05 MED ORDER — DICLOFENAC SODIUM 1 % EX GEL
4.0000 g | Freq: Four times a day (QID) | CUTANEOUS | Status: DC
  Administered 2020-02-05 – 2020-02-08 (×6): 4 g via TOPICAL
  Filled 2020-02-05 (×2): qty 100

## 2020-02-05 MED ORDER — GADOBUTROL 1 MMOL/ML IV SOLN
6.5000 mL | Freq: Once | INTRAVENOUS | Status: AC | PRN
  Administered 2020-02-05: 6.5 mL via INTRAVENOUS

## 2020-02-05 MED ORDER — ONDANSETRON HCL 4 MG/2ML IJ SOLN: 4.0000 mg | Freq: Four times a day (QID) | INTRAMUSCULAR | Status: DC | PRN

## 2020-02-05 MED ORDER — HYDROCODONE-ACETAMINOPHEN 5-325 MG PO TABS
1.0000 | ORAL_TABLET | Freq: Four times a day (QID) | ORAL | Status: DC | PRN
  Administered 2020-02-05 – 2020-02-06 (×3): 1 via ORAL
  Filled 2020-02-05 (×3): qty 1

## 2020-02-05 MED ORDER — HYDROXYCHLOROQUINE SULFATE 200 MG PO TABS
200.0000 mg | ORAL_TABLET | ORAL | Status: DC
  Administered 2020-02-05 – 2020-02-07 (×7): 200 mg via ORAL
  Filled 2020-02-05 (×7): qty 1

## 2020-02-05 MED ORDER — ACETAMINOPHEN 650 MG RE SUPP: 650.0000 mg | Freq: Four times a day (QID) | RECTAL | Status: DC | PRN

## 2020-02-05 MED ORDER — CARBAMAZEPINE 200 MG PO TABS
200.0000 mg | ORAL_TABLET | Freq: Three times a day (TID) | ORAL | Status: DC
  Administered 2020-02-05 – 2020-02-08 (×9): 200 mg via ORAL
  Filled 2020-02-05 (×9): qty 1

## 2020-02-05 NOTE — ED Notes (Signed)
Pt reports that neuro symptoms she is experiencing is consistent with her trigeminal neuralgia and all symptoms are normal for her. Nevertheless symptoms reported to Dr Maryland Pink, no new orders at this time, will continue to monitor

## 2020-02-05 NOTE — Progress Notes (Signed)
RT noted that patient had ABG ordered at 0656 this AM.  Patient had ABG obtained at 0130 with no critical values.  Text paged MD to see if ABG was still needed for this AM.  Per MD, hold on ABG until MD able to see patient.  Will continue to monitor.

## 2020-02-05 NOTE — Progress Notes (Addendum)
[facility] PROGRESS NOTE   Brittney Maddox [id number] DOB: [birth year] DOA: 02/04/2020  PCP: Nicolette Bang, DO  Brief History/Interval Summary: Brittney Maddox is a 54 y.o. 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 Per Ems patient,was noted to have 2 further similar events.  At that time it was noted that patient had temperature of 103 and was given tylenol for treatment. Per patient see awoke this am with her usual HA but noted new left right flank pain, she states the episode see has which she describes as jerking and shaking of her extremities lasted around 10-15 mins.  Patient was noted to have abnormal UA.  She was hospitalized for further management.  Reason for Visit: Acute pyelonephritis  Consultants: None  Procedures: None  Antibiotics: Anti-infectives (From admission, onward)   Start     Dose/Rate Route Frequency Ordered Stop   02/05/20 2200  cefTRIAXone (ROCEPHIN) 1 g in sodium chloride 0.9 % 100 mL IVPB    Note to Pharmacy: 1st dose now if not given in ed   1 g 200 mL/hr over 30 Minutes Intravenous Every 24 hours 02/05/20 0039     02/05/20 0200  hydroxychloroquine (PLAQUENIL) tablet 200 mg    Note to Pharmacy: Take 1 tablet (200 mg total) by mouth 2 times daily. Do not take on saturdays and sundays     200 mg Oral 2 times per day on Mon Tue Wed Thu Fri 02/05/20 0031     05 /25/21 2130  cefTRIAXone (ROCEPHIN) 1 g in sodium chloride 0.9 % 100 mL IVPB     1 g 200 mL/hr over 30 Minutes Intravenous  Once 02/04/20 2129 02/04/20 2247      Subjective/Interval History: Patient complains of  pain in the right flank area.  Some nausea.  No vomiting.  Denies any history of seizure disorder.  She states that she takes prednisone 5 mg daily.  ROS: Denies any chest pain or shortness of breath.    Assessment/Plan:  Acute pyelonephritis with sepsis, present on admission Patient noted to fever with temperature greater than 103 F.  She had significant rigors.  There was concern for seizure activity however it appears that it was more of rigors than seizures.  Patient's procalcitonin level noted to be greater than 8.  CT scan of the abdomen does show mild right hydronephrosis without any stones.  UA noted to be abnormal.  Follow-up on culture data.  Continue ceftriaxone.  Patient noted to have borderline low blood pressures.  She mentions that she takes prednisone 5 mg daily for history of lupus.  Not noted her home medication list.  We will give her stress dose steroids for 24 hours.  Questionable seizure activity Most likely rigors and not epileptiform activity.  CT head was unremarkable.  Acute kidney injury Resolved with IV hydration.  Monitor urine output.  Recent history of Neuro complaints -per pcp notes increased debility, word finding difficulties  -MRI head ordered due to patient recent past history of confusion and increasing debility , in back ground of Lupus, r/o lupus cerebritis.  MRI was unremarkable. -The symptoms could be related to medication side  effect of topamax or due to symptoms from complicated migraines  History of Lupus associated with chronic debility On imuran , hold for now due to concern for sepsis.  Patient also mentions that she takes prednisone 5 mg daily.  Not noted on her home medication list.  Give her stress dose steroids for now.  Normocytic anemia Drop in hemoglobin is likely dilutional.  No evidence of overt bleeding.  Continue to monitor.  TSH normal.  History of asthma  Stable.    Hx DVT  Not currently on anticoagulation  Hx of  abnormal EKG stress test  Patient she underwent cardiac catheterization which did not show any coronary disease.    Leukopenia related to imuran toxicity Resolved   Chronic occipital HA/migrane /TBI On Topamax and Tegretol   ADDENDUM Called by the nursing staff in the emergency department saying that the patient was experiencing pain in the right side of her face along with weakness on the right side.  However patient also informed that she has a history of trigeminal neuralgia and most of her symptoms are longstanding.  Patient was evaluated at bedside.  She mentions that she was originally diagnosed with this condition in September 2020.  She is followed by neurology in [place].  For 8 months she was unable to get out of bed without assistance.  Now her right lower extremity and right arm are moving better than before.  She does have subtle weakness in the right upper extremity and weakness in the right lower extremity.  Again she mentions that these are chronic for her.  She is noted to be on carbamazepine, Topamax, Neurontin and tizanidine.  Tizanidine has been resumed.  Her other medications are already on board.  MRI brain done earlier today did not show any acute stroke.  Continue to monitor.   DVT Prophylaxis: Subcutaneous heparin Code Status: Full code Family Communication: Discussed with the patient Disposition Plan:  Status is: Inpatient  Remains inpatient appropriate because:Inpatient level of care appropriate due to severity of illness   Dispo: The patient is from: Home              Anticipated d/c is to: Home              Anticipated d/c date is: 3 days              Patient currently is not medically stable to d/c.     Medications:  Scheduled: . carbamazepine  200 mg Oral TID  . diclofenac Sodium  4 g Topical QID  . gabapentin  300 mg Oral TID  . heparin  5,000 Units Subcutaneous Q8H  . hydrocortisone sod succinate (SOLU-CORTEF) inj  100 mg Intravenous Q8H  .  hydroxychloroquine  200 mg Oral 2 times per day on Mon Tue Wed Thu Fri  . topiramate  50 mg Oral BID   Continuous: . sodium chloride 100 mL/hr at 02/05/20 0342  . cefTRIAXone (ROCEPHIN)  IV     [id number] **OR** acetaminophen, albuterol, HYDROcodone-acetaminophen, ondansetron **OR** ondansetron (ZOFRAN) IV   Objective:  Vital Signs  Vitals:   02/05/20 0300 02/05/20 0315 02/05/20 0330 02/05/20 0445  BP: 94/67 90/63 94/62  99/72  Pulse: 72 80 76 87  Resp: 16 13 15 15   Temp:      TempSrc:      SpO2: 100% 99% 99% 100%  Weight:      Height:        Intake/Output Summary (Last 24 hours) at 02/05/2020 1122 Last  data filed at 02/05/2020 0142 Gross per 24 hour  Intake 6550.27 ml  Output --  Net 6550.27 ml   Filed Weights   02/04/20 1759  Weight: 64.4 kg    General appearance: Awake alert.  In no distress Resp: Clear to auscultation bilaterally.  Normal effort Cardio: S1-S2 is normal regular.  No S3-S4.  No rubs murmurs or bruit GI: Abdomen is soft.  Nontender nondistended.  Bowel sounds are present normal.  No masses organomegaly Right CVA tenderness noted.   Extremities: No edema.  Full range of motion of lower extremities. Neurologic: Alert and oriented x3.  No focal neurological deficits.    Lab Results:  Data Reviewed: I have personally reviewed following labs and imaging studies  CBC: Recent Labs  Lab 02/04/20 1815 02/05/20 0130 02/05/20 0256  WBC 5.1  --  4.3  HGB 11.2* 9.2* 9.9*  HCT 36.1 27.0* 32.3*  MCV 95.8  --  99.1  PLT 156  --  126*    Basic Metabolic Panel: Recent Labs  Lab 02/04/20 1815 02/05/20 0054 02/05/20 0130 02/05/20 0256  NA 141  --  143 144  K 3.5  --  3.5 3.6  CL 110  --   --  117*  CO2 20*  --   --  18*  GLUCOSE 162*  --   --  87  BUN 14  --   --  11  CREATININE 1.16* 0.78  --  0.80  CALCIUM 8.8*  --   --  7.9*    GFR: Estimated Creatinine Clearance: 65.8 mL/min (by C-G formula based on SCr of 0.8 mg/dL).  Liver  Function Tests: Recent Labs  Lab 02/04/20 1815 02/05/20 0256  AST 21 29  ALT 13 16  ALKPHOS 78 70  BILITOT 0.4 0.3  PROT 6.1* 5.2*  ALBUMIN 3.2* 2.6*    CBG: Recent Labs  Lab 02/04/20 1801  GLUCAP 142*    Thyroid Function Tests: Recent Labs    02/05/20 0054  TSH 0.808      Recent Results (from the past 240 hour(s))  SARS Coronavirus 2 by RT PCR (hospital order, performed in [facility] hospital lab) Nasopharyngeal Nasopharyngeal Swab     Status: None   Collection Time: 02/04/20  7:22 PM   Specimen: Nasopharyngeal Swab  Result Value Ref Range Status   SARS Coronavirus 2 NEGATIVE NEGATIVE Final    Comment: (NOTE) SARS-CoV-2 target nucleic acids are NOT DETECTED. The SARS-CoV-2 RNA is generally detectable in upper and lower respiratory specimens during the acute phase of infection. The lowest concentration of SARS-CoV-2 viral copies this assay can detect is 250 copies / mL. A negative result does not preclude SARS-CoV-2 infection and should not be used as the sole basis for treatment or other patient management decisions.  A negative result may occur with improper specimen collection / handling, submission of specimen other than nasopharyngeal swab, presence of viral mutation(s) within the areas targeted by this assay, and inadequate number of viral copies (<250 copies / mL). A negative result must be combined with clinical observations, patient history, and epidemiological information. Fact Sheet for Patients:   StrictlyIdeas.no Fact Sheet for Healthcare Providers: BankingDealers.co.za This test is not yet approved or cleared  by the [place] FDA and has been authorized for detection and/or diagnosis of SARS-CoV-2 by FDA under an Emergency Use Authorization (EUA).  This EUA will remain in effect (meaning this test can be used) for the duration of the COVID-19 declaration under Section 564(b)(1)  of the Act, 21  U.S.C. section 360bbb-3(b)(1), unless the authorization is terminated or revoked sooner. Performed at [facility], [place] [address] [place], [place] [address]       Radiology Studies: CT Head Wo Contrast  Result Date: 02/04/2020 CLINICAL DATA:  New onset seizure EXAM: CT HEAD WITHOUT CONTRAST TECHNIQUE: Contiguous axial images were obtained from the base of the skull through the vertex without intravenous contrast. COMPARISON:  None. FINDINGS: Brain: There is no acute intracranial hemorrhage, mass effect, or edema. Gray-white differentiation is preserved. There is no extra-axial fluid collection. Ventricles and sulci are within normal limits in size and configuration. Vascular: No hyperdense vessel or unexpected calcification. Skull: Calvarium is unremarkable. Sinuses/Orbits: No acute finding. Other: None. IMPRESSION: No acute intracranial hemorrhage, mass effect, or evidence of acute infarction. Electronically Signed   By: Macy Mis M.D.   On: 02/04/2020 20:24   MR BRAIN W WO CONTRAST  Result Date: 02/05/2020 CLINICAL DATA:  Acute headache with normal neuro exam. Seizure-like activity EXAM: MRI HEAD WITHOUT AND WITH CONTRAST TECHNIQUE: Multiplanar, multiecho pulse sequences of the brain and surrounding structures were obtained without and with intravenous contrast. CONTRAST:  6.77mL GADAVIST GADOBUTROL 1 MMOL/ML IV SOLN COMPARISON:  Head CT from yesterday FINDINGS: Brain: No infarction, hemorrhage, hydrocephalus, extra-axial collection or mass lesion. No gliosis or atrophy. No cortical finding to explain seizure. Vascular: Normal flow voids and vessel enhancement. Skull and upper cervical spine: Normal marrow signal. Sinuses/Orbits: Minor mucosal thickening in the inferior maxillary sinuses. IMPRESSION: Normal MRI of the brain. Electronically Signed   By: Monte Fantasia M.D.   On: 02/05/2020 06:17   DG Chest Portable 1 View  Result Date: 02/04/2020 CLINICAL DATA:  Seizure fever EXAM:  PORTABLE CHEST 1 VIEW COMPARISON:  08/05/2019 FINDINGS: The heart size and mediastinal contours are within normal limits. Both lungs are clear. The visualized skeletal structures are unremarkable. IMPRESSION: No active disease. Electronically Signed   By: Donavan Foil M.D.   On: 02/04/2020 19:56   CT Renal Stone Study  Result Date: 02/04/2020 CLINICAL DATA:  Flank pain, history of lupus EXAM: CT ABDOMEN AND PELVIS WITHOUT CONTRAST TECHNIQUE: Multidetector CT imaging of the abdomen and pelvis was performed following the standard protocol without IV contrast. COMPARISON:  None. FINDINGS: Lower chest: No acute pleural or parenchymal lung disease. Hepatobiliary: No focal liver abnormality is seen. No gallstones, gallbladder wall thickening, or biliary dilatation. Pancreas: Unremarkable. No pancreatic ductal dilatation or surrounding inflammatory changes. Spleen: Normal in size without focal abnormality. Adrenals/Urinary Tract: There is mild right-sided hydronephrosis without clear obstructing calculus. The left kidney is unremarkable. Bladder is moderately distended with no focal abnormality. The adrenals are unremarkable. Stomach/Bowel: No bowel obstruction or ileus. Normal appendix is seen in the right lower quadrant. No bowel wall thickening or inflammatory change. Postsurgical changes are seen from prior gastric surgery. Vascular/Lymphatic: Aortic atherosclerosis. Numerous phleboliths are seen within the pelvis. No enlarged abdominal or pelvic lymph nodes. Reproductive: Status post hysterectomy. No adnexal masses. Other: No abdominal wall hernia or abnormality. No abdominopelvic ascites. Musculoskeletal: No acute or destructive bony lesions. Reconstructed images demonstrate no additional findings. IMPRESSION: 1. Mild right-sided hydronephrosis without clear obstructing calculus. 2. Otherwise unremarkable unenhanced exam. Electronically Signed   By: Randa Ngo M.D.   On: 02/04/2020 22:44       LOS: 0  days   [facility] Pager on [email or web address]  02/05/2020, 11:22 AM

## 2020-02-05 NOTE — ED Notes (Signed)
Pt's scanner not working, 5 rights manually verified on all medications.

## 2020-02-05 NOTE — Progress Notes (Signed)
PT Cancellation Note  Patient Details Name: Brittney Maddox MRN: [id number] DOB: 1966/09/01   Cancelled Treatment:    Reason Eval/Treat Not Completed: Other (comment) Attempted see pt in ED and she was getting ready to be moved to a room. Checked when pt got to room and she was eating.  Will f/u as able. Brittney Maddox, PT Acute Rehab Services Pager [phone] [facility] [phone]    Brittney Maddox 02/05/2020, 5:45 PM

## 2020-02-05 NOTE — ED Notes (Signed)
Lunch Tray Ordered @ 1035. 

## 2020-02-06 ENCOUNTER — Ambulatory Visit: Payer: Medicare Other | Admitting: Physical Therapy

## 2020-02-06 DIAGNOSIS — E876 Hypokalemia: Secondary | ICD-10-CM

## 2020-02-06 LAB — CBC
HCT: 35.9 % — ABNORMAL LOW (ref 36.0–46.0)
Hemoglobin: 11.3 g/dL — ABNORMAL LOW (ref 12.0–15.0)
MCH: 30 pg (ref 26.0–34.0)
MCHC: 31.5 g/dL (ref 30.0–36.0)
MCV: 95.2 fL (ref 80.0–100.0)
Platelets: 190 10*3/uL (ref 150–400)
RBC: 3.77 MIL/uL — ABNORMAL LOW (ref 3.87–5.11)
RDW: 13.2 % (ref 11.5–15.5)
WBC: 3.1 10*3/uL — ABNORMAL LOW (ref 4.0–10.5)
nRBC: 0 % (ref 0.0–0.2)

## 2020-02-06 LAB — COMPREHENSIVE METABOLIC PANEL
ALT: 23 U/L (ref 0–44)
AST: 27 U/L (ref 15–41)
Albumin: 3 g/dL — ABNORMAL LOW (ref 3.5–5.0)
Alkaline Phosphatase: 79 U/L (ref 38–126)
Anion gap: 10 (ref 5–15)
BUN: 7 mg/dL (ref 6–20)
CO2: 19 mmol/L — ABNORMAL LOW (ref 22–32)
Calcium: 9.3 mg/dL (ref 8.9–10.3)
Chloride: 111 mmol/L (ref 98–111)
Creatinine, Ser: 0.72 mg/dL (ref 0.44–1.00)
GFR calc Af Amer: >60 mL/min (ref >60)
GFR calc non Af Amer: >60 mL/min (ref >60)
Glucose, Bld: 151 mg/dL — ABNORMAL HIGH (ref 70–99)
Potassium: 3.2 mmol/L — ABNORMAL LOW (ref 3.5–5.1)
Sodium: 140 mmol/L (ref 135–145)
Total Bilirubin: 0.4 mg/dL (ref 0.3–1.2)
Total Protein: 6.4 g/dL — ABNORMAL LOW (ref 6.5–8.1)

## 2020-02-06 LAB — PROCALCITONIN: Procalcitonin: 5.23 ng/mL

## 2020-02-06 MED ORDER — POTASSIUM CHLORIDE CRYS ER 20 MEQ PO TBCR
40.0000 meq | EXTENDED_RELEASE_TABLET | Freq: Once | ORAL | Status: AC
  Administered 2020-02-06: 40 meq via ORAL
  Filled 2020-02-06: qty 2

## 2020-02-06 NOTE — Progress Notes (Signed)
[facility] PROGRESS NOTE   Brittney Maddox [id number] DOB: [birth year] DOA: 02/04/2020  PCP: Nicolette Bang, DO  Brief History/Interval Summary: Brittney Maddox is a 54 y.o. 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 Per Ems patient,was noted to have 2 further similar events.  At that time it was noted that patient had temperature of 103 and was given tylenol for treatment. Per patient see awoke this am with her usual HA but noted new left right flank pain, she states the episode see has which she describes as jerking and shaking of her extremities lasted around 10-15 mins.  Patient was noted to have abnormal UA.  She was hospitalized for further management.  Reason for Visit: Acute pyelonephritis  Consultants: None  Procedures: None  Antibiotics: Anti-infectives (From admission, onward)   Start     Dose/Rate Route Frequency Ordered Stop   02/05/20 2200  cefTRIAXone (ROCEPHIN) 1 g in sodium chloride 0.9 % 100 mL IVPB    Note to Pharmacy: 1st dose now if not given in ed   1 g 200 mL/hr over 30 Minutes Intravenous Every 24 hours 02/05/20 0039     02/05/20 0200  hydroxychloroquine (PLAQUENIL) tablet 200 mg    Note to Pharmacy: Take 1 tablet (200 mg total) by mouth 2 times daily. Do not take on saturdays and sundays     200 mg Oral 2 times per day on Mon Tue Wed Thu Fri 02/05/20 0031     05 /25/21 2130  cefTRIAXone (ROCEPHIN) 1 g in sodium chloride 0.9 % 100 mL IVPB     1 g 200 mL/hr over 30 Minutes Intravenous  Once 02/04/20 2129 02/04/20 2247      Subjective/Interval History: Patient says that  her headache secondary to the trigeminal neuralgia is better today.  She is able to move her right leg better than yesterday.  Continues to have right flank pain.  No nausea or vomiting.  ROS: Denies any chest pain or shortness of breath.    Assessment/Plan:  Acute pyelonephritis with sepsis, present on admission Patient noted to fever with temperature greater than 103 F.  She had significant rigors.  There was concern for seizure activity however it appears that it was more of rigors than seizures.  Patient's procalcitonin level noted to be greater than 8.  CT scan of the abdomen does show mild right hydronephrosis without any stones.  UA noted to be abnormal.  Procalcitonin improved to 5.23 today. Continue ceftriaxone.  Urine culture with more than 100,000 colonies of gram-negative bacteria.  Waiting on final identification and sensitivities.  Follow-up on blood cultures as well.  Patient was also given stress dose steroids says she takes prednisone chronically and was noted to have borderline low blood pressures yesterday.  Blood pressures are stable today.  We will start weaning back on prednisone from tomorrow.  Questionable seizure activity Most likely rigors and not epileptiform activity.  CT head was unremarkable.  MRI was negative as well.  Acute kidney injury Resolved with IV hydration.  Monitor urine output.  Recent history of Neuro complaints/questionable history of trigeminal neuralgia -per pcp notes increased debility, word  finding difficulties  -MRI head ordered due to patient recent past history of confusion and increasing debility , in back ground of Lupus, r/o lupus cerebritis.  MRI was unremarkable. -The symptoms could be related to medication side effect of topamax or due to symptoms from complicated migraines or questionable history of trigeminal neuralgia Yesterday patient had similar symptoms.  MRI brain did not show any acute findings.  She is feeling better this  morning.  Continue to monitor.  History of Lupus associated with chronic debility On imuran , hold for now due to concern for sepsis.  Patient also mentions that she takes prednisone 5 mg daily.  Not noted on her home medication list.  Currently on stress dose steroids.  Normocytic anemia Drop in hemoglobin is likely dilutional.  No evidence of overt bleeding.  Stable today.  TSH normal.  Replace potassium.  History of asthma  Stable.    Hx DVT  Not currently on anticoagulation  Hx of abnormal EKG stress test  Patient mention that she underwent cardiac catheterization which did not show any coronary disease.    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  Continue to monitor.  Chronic occipital HA/migrane /TBI On Topamax and Tegretol    DVT Prophylaxis: Subcutaneous heparin Code Status: Full code Family Communication: Discussed with the patient Disposition Plan:  Status is: Inpatient  Remains inpatient appropriate because:Inpatient level of care appropriate due to severity of illness   Dispo: The patient is from: Home              Anticipated d/c is to: Home              Anticipated d/c date is: May 29.              Patient currently is not medically stable to d/c.     Medications:  Scheduled: . carbamazepine  200 mg Oral TID  . diclofenac Sodium  4 g Topical QID  . gabapentin  300 mg Oral TID  . heparin  5,000 Units Subcutaneous Q8H  . hydrocortisone sod succinate (SOLU-CORTEF) inj  100 mg Intravenous Q8H  . hydroxychloroquine  200 mg Oral 2 times per day on Mon Tue Wed Thu Fri  . topiramate  50 mg Oral BID   Continuous: . cefTRIAXone (ROCEPHIN)  IV 1 g (02/05/20 2139)   [id number] **OR** acetaminophen, albuterol, HYDROcodone-acetaminophen, ondansetron **OR** ondansetron (ZOFRAN) IV, tiZANidine   Objective:  Vital Signs  Vitals:   02/05/20 2015 02/06/20 0100 02/06/20 0519 02/06/20 0832  BP: 112/69 110/60 (!) 101/59  110/71  Pulse: 72 73 81 76  Resp: 20 18 20 20   Temp: 98.7 F (37.1 C) 98 F (36.7 C) 98.3 F (36.8 C) 98.2 F (36.8 C)  TempSrc: Oral  Oral Oral  SpO2: 99% 95% 98% 100%  Weight:   68.4 kg   Height:        Intake/Output Summary (Last 24 hours) at 02/06/2020 1037 Last data filed at 02/06/2020 0900 Gross per 24 hour  Intake 220 ml  Output 650 ml  Net -430 ml   Filed Weights   02/04/20 1759 02/05/20 1629 02/06/20 0519  Weight: 64.4 kg 69 kg 68.4 kg    General appearance: Awake alert.  In no distress Resp: Clear to auscultation bilaterally.  Normal effort Cardio: S1-S2 is normal regular.  No S3-S4.  No rubs murmurs or bruit GI: Abdomen is soft.  Nontender nondistended.  Bowel sounds are present normal.  No masses organomegaly Right  CVA tenderness Extremities: No edema.  Moving all her extremities.  The right lower extremity slightly less than the left. Neurologic: No new focal neurological deficits except for less mobility of the right lower extremity which is chronic as per patient.    Lab Results:  Data Reviewed: I have personally reviewed following labs and imaging studies  CBC: Recent Labs  Lab 02/04/20 1815 02/05/20 0130 02/05/20 0256 02/06/20 0613  WBC 5.1  --  4.3 3.1*  HGB 11.2* 9.2* 9.9* 11.3*  HCT 36.1 27.0* 32.3* 35.9*  MCV 95.8  --  99.1 95.2  PLT 156  --  126* [id number]    Basic Metabolic Panel: Recent Labs  Lab 02/04/20 1815 02/05/20 0054 02/05/20 0130 02/05/20 0256 02/06/20 0613  NA 141  --  143 144 140  K 3.5  --  3.5 3.6 3.2*  CL 110  --   --  117* 111  CO2 20*  --   --  18* 19*  GLUCOSE 162*  --   --  87 151*  BUN 14  --   --  11 7  CREATININE 1.16* 0.78  --  0.80 0.72  CALCIUM 8.8*  --   --  7.9* 9.3    GFR: Estimated Creatinine Clearance: 71.2 mL/min (by C-G formula based on SCr of 0.72 mg/dL).  Liver Function Tests: Recent Labs  Lab 02/04/20 1815 02/05/20 0256 02/06/20 0613  AST 21 29 27   ALT 13 16 23   ALKPHOS 78 70 79    BILITOT 0.4 0.3 0.4  PROT 6.1* 5.2* 6.4*  ALBUMIN 3.2* 2.6* 3.0*    CBG: Recent Labs  Lab 02/04/20 1801  GLUCAP 142*    Thyroid Function Tests: Recent Labs    02/05/20 0054  TSH 0.808      Recent Results (from the past 240 hour(s))  SARS Coronavirus 2 by RT PCR (hospital order, performed in [facility] hospital lab) Nasopharyngeal Nasopharyngeal Swab     Status: None   Collection Time: 02/04/20  7:22 PM   Specimen: Nasopharyngeal Swab  Result Value Ref Range Status   SARS Coronavirus 2 NEGATIVE NEGATIVE Final    Comment: (NOTE) SARS-CoV-2 target nucleic acids are NOT DETECTED. The SARS-CoV-2 RNA is generally detectable in upper and lower respiratory specimens during the acute phase of infection. The lowest concentration of SARS-CoV-2 viral copies this assay can detect is 250 copies / mL. A negative result does not preclude SARS-CoV-2 infection and should not be used as the sole basis for treatment or other patient management decisions.  A negative result may occur with improper specimen collection / handling, submission of specimen other than nasopharyngeal swab, presence of viral mutation(s) within the areas targeted by this assay, and inadequate number of viral copies (<250 copies / mL). A negative result must be combined with clinical observations, patient history, and epidemiological information. Fact Sheet for Patients:   StrictlyIdeas.no Fact Sheet for Healthcare Providers: BankingDealers.co.za This test is not yet approved or cleared  by the [place] FDA and has been authorized for detection and/or diagnosis of SARS-CoV-2 by FDA under an Emergency Use Authorization (EUA).  This EUA will remain in effect (meaning this test can be used) for the duration of the COVID-19 declaration under Section 564(b)(1) of the Act, 21 U.S.C. section 360bbb-3(b)(1), unless the authorization is terminated or revoked  sooner. Performed at [facility], [place] [address], [place], [place] [address]   Urine culture     Status: Abnormal (Preliminary result)  Collection Time: 02/04/20  8:38 PM   Specimen: Urine, Random  Result Value Ref Range Status   Specimen Description URINE, RANDOM  Final   Special Requests   Final    NONE Performed at [facility], [place] [address] [place], [place] [address]    Culture >=100,000 COLONIES/mL GRAM NEGATIVE RODS (A)  Final   Report Status PENDING  Incomplete  Blood culture (routine x 2)     Status: None (Preliminary result)   Collection Time: 02/04/20 11:40 PM   Specimen: BLOOD  Result Value Ref Range Status   Specimen Description BLOOD RIGHT ARM  Final   Special Requests   Final    BOTTLES DRAWN AEROBIC AND ANAEROBIC Blood Culture results may not be optimal due to an inadequate volume of blood received in culture bottles   Culture   Final    NO GROWTH 1 DAY Performed at [facility], [place] [address], [place], [place] [address]    Report Status PENDING  Incomplete  Blood culture (routine x 2)     Status: None (Preliminary result)   Collection Time: 02/04/20 11:40 PM   Specimen: BLOOD  Result Value Ref Range Status   Specimen Description BLOOD LEFT ARM  Final   Special Requests   Final    BOTTLES DRAWN AEROBIC AND ANAEROBIC Blood Culture adequate volume   Culture   Final    NO GROWTH 1 DAY Performed at [facility], [place] [address] [place], [place] [address]    Report Status PENDING  Incomplete      Radiology Studies: CT Head Wo Contrast  Result Date: 02/04/2020 CLINICAL DATA:  New onset seizure EXAM: CT HEAD WITHOUT CONTRAST TECHNIQUE: Contiguous axial images were obtained from the base of the skull through the vertex without intravenous contrast. COMPARISON:  None. FINDINGS: Brain: There is no acute intracranial hemorrhage, mass effect, or edema. Gray-white differentiation is preserved. There is no extra-axial fluid collection.  Ventricles and sulci are within normal limits in size and configuration. Vascular: No hyperdense vessel or unexpected calcification. Skull: Calvarium is unremarkable. Sinuses/Orbits: No acute finding. Other: None. IMPRESSION: No acute intracranial hemorrhage, mass effect, or evidence of acute infarction. Electronically Signed   By: Macy Mis M.D.   On: 02/04/2020 20:24   MR BRAIN W WO CONTRAST  Result Date: 02/05/2020 CLINICAL DATA:  Acute headache with normal neuro exam. Seizure-like activity EXAM: MRI HEAD WITHOUT AND WITH CONTRAST TECHNIQUE: Multiplanar, multiecho pulse sequences of the brain and surrounding structures were obtained without and with intravenous contrast. CONTRAST:  6.32mL GADAVIST GADOBUTROL 1 MMOL/ML IV SOLN COMPARISON:  Head CT from yesterday FINDINGS: Brain: No infarction, hemorrhage, hydrocephalus, extra-axial collection or mass lesion. No gliosis or atrophy. No cortical finding to explain seizure. Vascular: Normal flow voids and vessel enhancement. Skull and upper cervical spine: Normal marrow signal. Sinuses/Orbits: Minor mucosal thickening in the inferior maxillary sinuses. IMPRESSION: Normal MRI of the brain. Electronically Signed   By: Monte Fantasia M.D.   On: 02/05/2020 06:17   DG Chest Portable 1 View  Result Date: 02/04/2020 CLINICAL DATA:  Seizure fever EXAM: PORTABLE CHEST 1 VIEW COMPARISON:  08/05/2019 FINDINGS: The heart size and mediastinal contours are within normal limits. Both lungs are clear. The visualized skeletal structures are unremarkable. IMPRESSION: No active disease. Electronically Signed   By: Donavan Foil M.D.   On: 02/04/2020 19:56   CT Renal Stone Study  Result Date: 02/04/2020 CLINICAL DATA:  Flank pain, history of lupus  EXAM: CT ABDOMEN AND PELVIS WITHOUT CONTRAST TECHNIQUE: Multidetector CT imaging of the abdomen and pelvis was performed following the standard protocol without IV contrast. COMPARISON:  None. FINDINGS: Lower chest: No acute  pleural or parenchymal lung disease. Hepatobiliary: No focal liver abnormality is seen. No gallstones, gallbladder wall thickening, or biliary dilatation. Pancreas: Unremarkable. No pancreatic ductal dilatation or surrounding inflammatory changes. Spleen: Normal in size without focal abnormality. Adrenals/Urinary Tract: There is mild right-sided hydronephrosis without clear obstructing calculus. The left kidney is unremarkable. Bladder is moderately distended with no focal abnormality. The adrenals are unremarkable. Stomach/Bowel: No bowel obstruction or ileus. Normal appendix is seen in the right lower quadrant. No bowel wall thickening or inflammatory change. Postsurgical changes are seen from prior gastric surgery. Vascular/Lymphatic: Aortic atherosclerosis. Numerous phleboliths are seen within the pelvis. No enlarged abdominal or pelvic lymph nodes. Reproductive: Status post hysterectomy. No adnexal masses. Other: No abdominal wall hernia or abnormality. No abdominopelvic ascites. Musculoskeletal: No acute or destructive bony lesions. Reconstructed images demonstrate no additional findings. IMPRESSION: 1. Mild right-sided hydronephrosis without clear obstructing calculus. 2. Otherwise unremarkable unenhanced exam. Electronically Signed   By: Randa Ngo M.D.   On: 02/04/2020 22:44       LOS: 1 day   [facility] Pager on [email or web address]  02/06/2020, 10:37 AM

## 2020-02-06 NOTE — Evaluation (Signed)
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   Clinical Impression  Pt admitted with above diagnosis. Ptwas able to ambulate with  RW with supervision with good safety awareness.  Should progress well and return to Outpt PT for continued balance training.  Will follow acutely. Pt currently with functional limitations due to the deficits listed below (see PT Problem List). Pt will benefit from skilled PT to increase their independence and safety with mobility to allow discharge to the venue listed below.      Follow Up Recommendations Outpatient PT    Equipment Recommendations  None recommended by PT    Recommendations for Other Services       Precautions / Restrictions Precautions Precautions: Fall Required Braces or Orthoses: Other Brace Other Brace: right AFO Restrictions Weight Bearing Restrictions: No      Mobility  Bed Mobility Overal bed mobility: Independent                Transfers Overall transfer level: Needs assistance Equipment used: Rolling walker (2 wheeled) Transfers: Sit to/from Stand Sit to Stand: Supervision         General transfer comment: Pt donned aFO on her own.  Pt safe with standing to the rW  Ambulation/Gait Ambulation/Gait assistance: Supervision Gait Distance (Feet): 150 Feet Assistive device: Rolling walker (2 wheeled) Gait Pattern/deviations: Step-through pattern;Decreased stride length    Gait velocity interpretation: <1.31 ft/sec, indicative of household ambulator General Gait Details: Pt was able to ambulate with RW with occasional cues.  Pt safe overall with RW.    Stairs            Wheelchair Mobility    Modified Rankin (Stroke Patients Only)       Balance Overall balance assessment: Needs assistance Sitting-balance support: No upper extremity supported;Feet supported Sitting balance-Leahy Scale: Fair     Standing balance support: Bilateral upper extremity supported;During functional activity Standing balance-Leahy Scale: Poor Standing balance comment: Pt waS able to stand with UE support.                              Pertinent Vitals/Pain Pain Assessment: No/denies pain    Home Living Family/patient expects to be discharged to:: Private residence Living Arrangements: Parent(moved in with mom) Available Help at Discharge: Family;Available 24 hours/day Type of Home: Apartment Home Access: Level entry     Home Layout: One level Home Equipment: Walker - 2 wheels Additional Comments: Was going to outpt PT for balance and has foot brace per pt. Had started practiving with cane    Prior Function Level of Independence: Needs assistance   Gait / Transfers Assistance Needed: was using RW in home and was Modif I   ADL's / Homemaking Assistance Needed: B/D self  Comments: holds furntiure alot per pt     Hand Dominance   Dominant Hand: Right    Extremity/Trunk Assessment   Upper Extremity Assessment Upper Extremity Assessment: Defer to OT evaluation  Lower Extremity Assessment Lower Extremity Assessment: RLE deficits/detail RLE Deficits / Details: ankle 1/5, knee 3/5, hip 3/5 RLE Sensation: decreased light touch;decreased proprioception    Cervical / Trunk Assessment Cervical / Trunk Assessment: Normal  Communication   Communication: No difficulties  Cognition Arousal/Alertness: Awake/alert Behavior During Therapy: WFL  for tasks assessed/performed Overall Cognitive Status: Within Functional Limits for tasks assessed                                        General Comments      Exercises     Assessment/Plan    PT Assessment Patient needs continued PT services  PT Problem List Decreased activity tolerance;Decreased balance;Decreased mobility;Decreased strength;Decreased knowledge of use of DME;Decreased safety awareness;Impaired sensation       PT Treatment Interventions DME instruction;Gait training;Functional mobility training;Therapeutic activities;Therapeutic exercise;Balance training;Patient/family education    PT Goals (Current goals can be found in the Care Plan section)  Acute Rehab PT Goals Patient Stated Goal: to go home PT Goal Formulation: With patient Time For Goal Achievement: 02/20/20 Potential to Achieve Goals: Good    Frequency Min 3X/week   Barriers to discharge        Co-evaluation               AM-PAC PT "6 Clicks" Mobility  Outcome Measure Help needed turning from your back to your side while in a flat bed without using bedrails?: None Help needed moving from lying on your back to sitting on the side of a flat bed without using bedrails?: None Help needed moving to and from a bed to a chair (including a wheelchair)?: A Little Help needed standing up from a chair using your arms (e.g., wheelchair or bedside chair)?: A Little Help needed to walk in hospital room?: A Little Help needed climbing 3-5 steps with a railing? : A Lot 6 Click Score: 19    End of Session Equipment Utilized During Treatment: Gait belt(AFO right) Activity Tolerance: Patient tolerated treatment well Patient left: in bed;with call bell/phone within reach(sitting EOB ) Nurse Communication: Mobility status PT Visit Diagnosis: Unsteadiness on feet (R26.81);Muscle weakness (generalized) (M62.81)    Time: [id number] PT Time Calculation (min) (ACUTE ONLY): 23 min   Charges:    PT Evaluation $PT Eval Moderate Complexity: 1 Mod PT Treatments $Gait Training: 8-22 mins        Ryott Rafferty W,PT Acute Rehabilitation Services Pager:  [phone]  Office:  [place] 02/06/2020, 4:20 PM

## 2020-02-06 NOTE — Plan of Care (Signed)

## 2020-02-07 LAB — BASIC METABOLIC PANEL
Anion gap: 9 (ref 5–15)
BUN: 6 mg/dL (ref 6–20)
CO2: 21 mmol/L — ABNORMAL LOW (ref 22–32)
Calcium: 9.1 mg/dL (ref 8.9–10.3)
Chloride: 112 mmol/L — ABNORMAL HIGH (ref 98–111)
Creatinine, Ser: 0.67 mg/dL (ref 0.44–1.00)
GFR calc Af Amer: >60 mL/min (ref >60)
GFR calc non Af Amer: >60 mL/min (ref >60)
Glucose, Bld: 127 mg/dL — ABNORMAL HIGH (ref 70–99)
Potassium: 3.7 mmol/L (ref 3.5–5.1)
Sodium: 142 mmol/L (ref 135–145)

## 2020-02-07 LAB — URINE CULTURE: Culture: >=100000 — AB

## 2020-02-07 LAB — MAGNESIUM: Magnesium: 1.8 mg/dL (ref 1.7–2.4)

## 2020-02-07 LAB — PROCALCITONIN: Procalcitonin: 2.71 ng/mL

## 2020-02-07 MED ORDER — CEPHALEXIN 250 MG PO CAPS
500.0000 mg | ORAL_CAPSULE | Freq: Four times a day (QID) | ORAL | Status: DC
  Administered 2020-02-07 – 2020-02-08 (×4): 500 mg via ORAL
  Filled 2020-02-07 (×4): qty 2

## 2020-02-07 MED ORDER — HYDROCORTISONE NA SUCCINATE PF 100 MG IJ SOLR
25.0000 mg | Freq: Three times a day (TID) | INTRAMUSCULAR | Status: DC
  Administered 2020-02-07 – 2020-02-08 (×3): 25 mg via INTRAVENOUS
  Filled 2020-02-07 (×3): qty 2

## 2020-02-07 MED ORDER — POTASSIUM CHLORIDE CRYS ER 20 MEQ PO TBCR
40.0000 meq | EXTENDED_RELEASE_TABLET | Freq: Once | ORAL | Status: AC
  Administered 2020-02-07: 40 meq via ORAL
  Filled 2020-02-07: qty 2

## 2020-02-07 NOTE — Progress Notes (Signed)
[facility] PROGRESS NOTE   Brittney Maddox [id number] DOB: Nov 12, 1965 DOA: 02/04/2020  PCP: Nicolette Bang, DO  Brief History/Interval Summary: Brittney Maddox is a 54 y.o. 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 Per Ems patient,was noted to have 2 further similar events.  At that time it was noted that patient had temperature of 103 and was given tylenol for treatment. Per patient see awoke this am with her usual HA but noted new left right flank pain, she states the episode see has which she describes as jerking and shaking of her extremities lasted around 10-15 mins.  Patient was noted to have abnormal UA.  She was hospitalized for further management.  Reason for Visit: Acute pyelonephritis  Consultants: None  Procedures: None  Antibiotics: Anti-infectives (From admission, onward)   Start     Dose/Rate Route Frequency Ordered Stop   02/05/20 2200  cefTRIAXone (ROCEPHIN) 1 g in sodium chloride 0.9 % 100 mL IVPB    Note to Pharmacy: 1st dose now if not given in ed   1 g 200 mL/hr over 30 Minutes Intravenous Every 24 hours 02/05/20 0039     02/05/20 0200  hydroxychloroquine (PLAQUENIL) tablet 200 mg    Note to Pharmacy: Take 1 tablet (200 mg total) by mouth 2 times daily. Do not take on saturdays and sundays     200 mg Oral 2 times per day on Mon Tue Wed Thu Fri 02/05/20 0031     05 /25/21 2130  cefTRIAXone (ROCEPHIN) 1 g in sodium chloride 0.9 % 100 mL IVPB     1 g 200 mL/hr over 30 Minutes Intravenous  Once 02/04/20 2129 02/04/20 2247      Subjective/Interval History: Patient states that  she is feeling much better today.  Slept well overnight.  She mentions that she has history of kidney stones as well as some urological problems many years ago for which she had to undergo some kind of surgery.  This was done in [place].  Her right flank pain is better today.  No nausea or vomiting.  ROS: Denies any chest pain or shortness of breath    Assessment/Plan:  Acute pyelonephritis with sepsis, present on admission Patient noted to fever with temperature greater than 103 F at the time of admission.  She had significant rigors.  There was concern for seizure activity however it appears that it was more of rigors than seizures.  Patient's procalcitonin level noted to be greater than 8.  CT scan of the abdomen does show mild right hydronephrosis without any stones.  UA noted to be abnormal.  Procalcitonin improved to 2.71.  Patient was started on ceftriaxone at admission. Urine culture is growing E. coli.  Noted to be pansensitive.  We will change her to cephalexin.  Blood cultures are negative so far.  Patient was also given stress dose steroids since she takes prednisone chronically and was noted to have borderline low blood pressures.  Blood pressures have stabilized.  We will cut back on her stress dose steroids.   Questionable seizure activity Most likely rigors and not epileptiform activity.  CT head was unremarkable.  MRI was negative as well.  Acute kidney injury Resolved with IV hydration.  Monitor urine output.  Recent history of Neuro complaints/questionable history of trigeminal neuralgia -Per pcp notes she has had increased debility and word finding difficulties  -MRI head ordered due to patient recent past history of confusion and increasing debility , in back ground of Lupus, r/o lupus cerebritis.  MRI was unremarkable. -The symptoms could be related to medication side effect of topamax or due to symptoms from complicated migraines or questionable history of trigeminal  neuralgia Yesterday patient had similar symptoms.  MRI brain did not show any acute findings.  She is feeling better this morning.  Continue to monitor.  She is followed by Dr. Brett Fairy with [facility].  History of Lupus associated with chronic debility On imuran, holding for now due to concern for sepsis.  Patient also mentions that she takes prednisone 5 mg daily.  Not noted on her home medication list.  Currently on stress dose steroids.  We will start tapering.  Normocytic anemia Drop in hemoglobin is likely dilutional.  No evidence of overt bleeding.  Stable today.  TSH normal.  Potassium has been corrected.    History of asthma  Stable.    Hx DVT  Not currently on anticoagulation  Hx of abnormal EKG stress test  Patient mention that she underwent cardiac catheterization which did not show any coronary disease.    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  Continue to monitor.  Chronic occipital HA/migrane /TBI On Topamax and Tegretol    DVT Prophylaxis: Subcutaneous heparin Code Status: Full code Family Communication: Discussed with the patient Disposition Plan:  Status is: Inpatient  Remains inpatient appropriate because:Inpatient level of care appropriate due to severity of illness   Dispo: The patient is from: Home              Anticipated d/c is to: Home              Anticipated d/c date is: May 29.              Patient currently is not medically stable to d/c.     Medications:  Scheduled: . carbamazepine  200 mg Oral TID  . diclofenac Sodium  4 g Topical QID  . gabapentin  300 mg Oral TID  . heparin  5,000 Units Subcutaneous Q8H  . hydrocortisone sod succinate (SOLU-CORTEF) inj  100 mg Intravenous Q8H  . hydroxychloroquine  200 mg Oral 2 times per day on Mon Tue Wed Thu Fri  . topiramate  50 mg Oral BID   Continuous: . cefTRIAXone (ROCEPHIN)  IV 1 g (02/06/20 2142)   [id number] **OR**  acetaminophen, albuterol, HYDROcodone-acetaminophen, ondansetron **OR** ondansetron (ZOFRAN) IV, tiZANidine   Objective:  Vital Signs  Vitals:   02/07/20 0129 02/07/20 0142 02/07/20 0527 02/07/20 0730  BP: (!) 104/57  109/61 119/77  Pulse: 73  68 76  Resp: 17  15 18   Temp: 98.4 F (36.9 C)  98.6 F (37 C) 98.9 F (37.2 C)  TempSrc: Oral  Oral Oral  SpO2: 100%  99% 100%  Weight:  68.6 kg    Height:        Intake/Output Summary (Last 24 hours) at 02/07/2020 0959 Last data filed at 02/07/2020 0142 Gross per 24 hour  Intake 460 ml  Output 600 ml  Net -140 ml   Filed Weights   02/05/20 1629 02/06/20 0519 02/07/20 0142  Weight: 69 kg 68.4 kg  68.6 kg    General appearance: Awake alert.  In no distress Resp: Clear to auscultation bilaterally.  Normal effort Cardio: S1-S2 is normal regular.  No S3-S4.  No rubs murmurs or bruit GI: Abdomen is soft.  Nontender nondistended.  Bowel sounds are present normal.  No masses organomegaly Extremities: No edema.  Full range of motion of lower extremities. Neurologic: Alert and oriented x3.  The weakness in the right leg seems to have improved.  No other deficits noted.     Lab Results:  Data Reviewed: I have personally reviewed following labs and imaging studies  CBC: Recent Labs  Lab 02/04/20 1815 02/05/20 0130 02/05/20 0256 02/06/20 0613  WBC 5.1  --  4.3 3.1*  HGB 11.2* 9.2* 9.9* 11.3*  HCT 36.1 27.0* 32.3* 35.9*  MCV 95.8  --  99.1 95.2  PLT 156  --  126* [id number]    Basic Metabolic Panel: Recent Labs  Lab 02/04/20 1815 02/05/20 0054 02/05/20 0130 02/05/20 0256 02/06/20 0613 02/07/20 0430  NA 141  --  143 144 140 142  K 3.5  --  3.5 3.6 3.2* 3.7  CL 110  --   --  117* 111 112*  CO2 20*  --   --  18* 19* 21*  GLUCOSE 162*  --   --  87 151* 127*  BUN 14  --   --  11 7 6   CREATININE 1.16* 0.78  --  0.80 0.72 0.67  CALCIUM 8.8*  --   --  7.9* 9.3 9.1  MG  --   --   --   --   --  1.8    GFR: Estimated Creatinine  Clearance: 71.3 mL/min (by C-G formula based on SCr of 0.67 mg/dL).  Liver Function Tests: Recent Labs  Lab 02/04/20 1815 02/05/20 0256 02/06/20 0613  AST 21 29 27   ALT 13 16 23   ALKPHOS 78 70 79  BILITOT 0.4 0.3 0.4  PROT 6.1* 5.2* 6.4*  ALBUMIN 3.2* 2.6* 3.0*    CBG: Recent Labs  Lab 02/04/20 1801  GLUCAP 142*    Thyroid Function Tests: Recent Labs    02/05/20 0054  TSH 0.808      Recent Results (from the past 240 hour(s))  SARS Coronavirus 2 by RT PCR (hospital order, performed in [place] hospital lab) Nasopharyngeal Nasopharyngeal Swab     Status: None   Collection Time: 02/04/20  7:22 PM   Specimen: Nasopharyngeal Swab  Result Value Ref Range Status   SARS Coronavirus 2 NEGATIVE NEGATIVE Final    Comment: (NOTE) SARS-CoV-2 target nucleic acids are NOT DETECTED. The SARS-CoV-2 RNA is generally detectable in upper and lower respiratory specimens during the acute phase of infection. The lowest concentration of SARS-CoV-2 viral copies this assay can detect is 250 copies / mL. A negative result does not preclude SARS-CoV-2 infection and should not be used as the sole basis for treatment or other patient management decisions.  A negative result may occur with improper specimen collection / handling, submission of specimen other than nasopharyngeal swab, presence of viral mutation(s) within the areas targeted by this assay, and inadequate number of viral copies (<250 copies / mL). A negative result must be combined with clinical observations, patient history, and epidemiological information. Fact Sheet for Patients:   StrictlyIdeas.no Fact Sheet for Healthcare Providers: BankingDealers.co.za This test is not yet approved or cleared  by the [place] FDA and has been authorized for detection and/or diagnosis of SARS-CoV-2 by FDA  under an Emergency Use Authorization (EUA).  This EUA will remain in effect  (meaning this test can be used) for the duration of the COVID-19 declaration under Section 564(b)(1) of the Act, 21 U.S.C. section 360bbb-3(b)(1), unless the authorization is terminated or revoked sooner. Performed at [facility], [place] [address] [place], [place] [address]   Urine culture     Status: Abnormal   Collection Time: 02/04/20  8:38 PM   Specimen: Urine, Random  Result Value Ref Range Status   Specimen Description URINE, RANDOM  Final   Special Requests   Final    NONE Performed at [facility], [place] [address] [place], [place] [address]    Culture >=100,000 COLONIES/mL ESCHERICHIA COLI (A)  Final   Report Status 02/07/2020 FINAL  Final   Organism ID, Bacteria ESCHERICHIA COLI (A)  Final      Susceptibility   Escherichia coli - MIC*    AMPICILLIN 4 SENSITIVE Sensitive     CEFAZOLIN <=4 SENSITIVE Sensitive     CEFTRIAXONE <=1 SENSITIVE Sensitive     CIPROFLOXACIN <=0.25 SENSITIVE Sensitive     GENTAMICIN <=1 SENSITIVE Sensitive     IMIPENEM <=0.25 SENSITIVE Sensitive     NITROFURANTOIN <=16 SENSITIVE Sensitive     TRIMETH/SULFA <=20 SENSITIVE Sensitive     AMPICILLIN/SULBACTAM <=2 SENSITIVE Sensitive     PIP/TAZO <=4 SENSITIVE Sensitive     * >=100,000 COLONIES/mL ESCHERICHIA COLI  Blood culture (routine x 2)     Status: None (Preliminary result)   Collection Time: 02/04/20 11:40 PM   Specimen: BLOOD  Result Value Ref Range Status   Specimen Description BLOOD RIGHT ARM  Final   Special Requests   Final    BOTTLES DRAWN AEROBIC AND ANAEROBIC Blood Culture results may not be optimal due to an inadequate volume of blood received in culture bottles   Culture   Final    NO GROWTH 2 DAYS Performed at [facility] [address] [place], [place] [address]    Report Status PENDING  Incomplete  Blood culture (routine x 2)     Status: None (Preliminary result)   Collection Time: 02/04/20 11:40 PM   Specimen: BLOOD  Result Value Ref Range Status    Specimen Description BLOOD LEFT ARM  Final   Special Requests   Final    BOTTLES DRAWN AEROBIC AND ANAEROBIC Blood Culture adequate volume   Culture   Final    NO GROWTH 2 DAYS Performed at [facility], [place] [address], [place], [place] [address]    Report Status PENDING  Incomplete      Radiology Studies: No results found.     LOS: 2 days   [facility] Pager on [email or web address]  02/07/2020, 9:59 AM

## 2020-02-08 ENCOUNTER — Encounter (HOSPITAL_COMMUNITY): Payer: Self-pay | Admitting: Internal Medicine

## 2020-02-08 MED ORDER — AZATHIOPRINE 50 MG PO TABS: 50.0000 mg | ORAL_TABLET | Freq: Every day | ORAL | Status: AC

## 2020-02-08 MED ORDER — PREDNISONE 10 MG PO TABS
ORAL_TABLET | ORAL | 0 refills | Status: AC
Start: 2020-02-08 — End: ?

## 2020-02-08 MED ORDER — CEPHALEXIN 500 MG PO CAPS: 500.0000 mg | ORAL_CAPSULE | Freq: Four times a day (QID) | ORAL | 0 refills | Status: AC

## 2020-02-08 MED ORDER — PREDNISONE 5 MG PO TABS: 5.0000 mg | ORAL_TABLET | Freq: Every day | ORAL | Status: AC

## 2020-02-08 NOTE — Discharge Instructions (Signed)
Pyelonephritis, Adult  Pyelonephritis is an infection that occurs in the kidney. The kidneys are organs that help clean the blood by moving waste out of the blood and into the pee (urine). This infection can happen quickly, or it can last for a long time. In most cases, it clears up with treatment and does not cause other problems. What are the causes? This condition may be caused by:  Germs (bacteria) going from the bladder up to the kidney. This may happen after having a bladder infection.  Germs going from the blood to the kidney. What increases the risk? This condition is more likely to develop in:  Pregnant women.  Older people.  People who have any of these conditions: ? Diabetes. ? Inflammation of the prostate gland (prostatitis), in males. ? Kidney stones or bladder stones. ? Other problems with the kidney or the parts of your body that carry pee from the kidneys to the bladder (ureters). ? Cancer.  People who have a small, thin tube (catheter) placed in the bladder.  People who are sexually active.  Women who use a medicine that kills sperm (spermicide) to prevent pregnancy.  People who have had a prior urinary tract infection (UTI). What are the signs or symptoms? Symptoms of this condition include:  Peeing often.  A strong urge to pee right away.  Burning or stinging when peeing.  Belly pain.  Back pain.  Pain in the side (flank area).  Fever or chills.  Blood in the pee, or dark pee.  Feeling sick to your stomach (nauseous) or throwing up (vomiting). How is this treated? This condition may be treated by:  Taking antibiotic medicines by mouth (orally).  Drinking enough fluids. If the infection is bad, you may need to stay in the hospital. You may be given antibiotics and fluids that are put directly into a vein through an IV tube. In some cases, other treatments may be needed. Follow these instructions at home: Medicines  Take your antibiotic  medicine as told by your doctor. Do not stop taking the antibiotic even if you start to feel better.  Take over-the-counter and prescription medicines only as told by your doctor. General instructions   Drink enough fluid to keep your pee pale yellow.  Avoid caffeine, tea, and carbonated drinks.  Pee (urinate) often. Avoid holding in pee for long periods of time.  Pee before and after sex.  After pooping (having a bowel movement), women should wipe from front to back. Use each tissue only once.  Keep all follow-up visits as told by your doctor. This is important. Contact a doctor if:  You do not feel better after 2 days.  Your symptoms get worse.  You have a fever. Get help right away if:  You cannot take your medicine or drink fluids as told.  You have chills and shaking.  You throw up.  You have very bad pain in your side or back.  You feel very weak or you pass out (faint). Summary  Pyelonephritis is an infection that occurs in the kidney.  In most cases, this infection clears up with treatment and does not cause other problems.  Take your antibiotic medicine as told by your doctor. Do not stop taking the antibiotic even if you start to feel better.  Drink enough fluid to keep your pee pale yellow. This information is not intended to replace advice given to you by your health care provider. Make sure you discuss any questions you have with  your health care provider. Document Revised: 07/03/2018 Document Reviewed: 07/03/2018 Elsevier Patient Education  2020 [employer]  

## 2020-02-08 NOTE — Discharge Summary (Signed)
[facility]  Physician Discharge Summary   Patient ID: Brittney Maddox MRN: [id number] DOB/AGE: Sep 18, 1965 54 y.o.  Admit date: 02/04/2020 Discharge date: 02/08/2020  PCP: Nicolette Bang, DO  DISCHARGE DIAGNOSES:  Acute pyelonephritis with sepsis present on admission Acute kidney injury, resolved History of trigeminal neuralgia History of lupus Normocytic anemia History of asthma Leukopenia due to [place] UP: 1. 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 At that time it was noted that patient hadtemperature of 103 and was given tylenol for treatment. Per patient see awoke this am with her usual HA but noted new left right flank pain, she states the episode see has which she describes as jerking and shaking of her extremities lasted around 10-15 mins.  Patient was noted to have abnormal UA.  She was hospitalized for further management.   HOSPITAL COURSE:   Acute pyelonephritis with sepsis, present on admission Patient noted to fever with temperature greater than 103 F at the time of admission.  She had significant rigors. There was concern for seizure activity however it appears that it was more of  rigors than seizures.  Patient's procalcitonin level noted to be greater than 8.  CT scan of the abdomen does show mild right hydronephrosis without any stones.  UA noted to be abnormal.  Procalcitonin improved to 2.71.  Patient was started on ceftriaxone at admission. Urine culture growing E. coli.  Noted to be pansensitive.    She was changed over to cephalexin.  Continue for 7 more days.  Blood cultures are negative so far.  Patient was also given stress dose steroids since she takes prednisone chronically and was noted to have borderline low blood pressures.  Blood pressures have stabilized.    She will be tapered back to her usual dose of prednisone.  Questionable seizure activity Most likely rigors and not epileptiform activity.  CT head was unremarkable.  MRI was negative as well.  Acute kidney injury Resolved with IV hydration.    Recent history of Neuro complaints/questionable history of trigeminal neuralgia -Per pcp notes she has had increased debility and word finding difficulties  -MRI head ordered due to patient recent past history of confusion and increasing debility , in back ground of Lupus, r/o lupus cerebritis.  MRI was unremarkable. -The symptoms could be related to medication side effect of topamax or due to symptoms from complicated migraines or questionable history of trigeminal neuralgia Patient had similar symptoms during this hospital stay.  MRI brain did not show any acute findings.  She is followed by Dr. Brett Fairy with [facility]. She has improved and is ambulating on her own.  History of Lupus associated with chronic debility Imuran was held due to concern for sepsis.  She was given stress dose steroids.  Will be tapered down gradually  to her home dose of prednisone.  Imuran can be resumed next week.    Normocytic anemia Drop in hemoglobin is likely dilutional.  No evidence of overt bleeding.  TSH normal.  Potassium has been corrected.     History of asthma  Stable.    HxDVT  Notcurrentlyon anticoagulation  Hx of abnormal EKG stress test  Patient mention that she underwent cardiac catheterization which did not show any coronary disease.    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   Chronic occipital HA/migrane /TBI On Topamax and Tegretol   Overall stable.  Okay for discharge home today.  PERTINENT LABS:  The results of significant diagnostics from this hospitalization (including imaging, microbiology, ancillary and laboratory) are listed below for reference.    Microbiology: Recent Results (from the past 240 hour(s))  SARS Coronavirus 2 by RT PCR (hospital order, performed in [facility] hospital lab) Nasopharyngeal Nasopharyngeal Swab     Status: None   Collection Time: 02/04/20  7:22 PM   Specimen: Nasopharyngeal Swab  Result Value Ref Range Status   SARS Coronavirus 2 NEGATIVE NEGATIVE Final    Comment: (NOTE) SARS-CoV-2 target nucleic acids are NOT DETECTED. The SARS-CoV-2 RNA is generally detectable in upper and lower respiratory specimens during the acute phase of infection. The lowest concentration of SARS-CoV-2 viral copies this assay can detect is 250 copies / mL. A negative result does not preclude SARS-CoV-2 infection and should not be used as the sole basis for treatment or other patient management decisions.  A negative result may occur with improper specimen collection / handling, submission of specimen other than nasopharyngeal swab, presence of viral mutation(s) within the areas targeted by this assay, and inadequate number of viral copies (<250 copies / mL). A negative result must be combined with clinical observations, patient history, and epidemiological information. Fact Sheet for Patients:   StrictlyIdeas.no Fact Sheet for Healthcare Providers: BankingDealers.co.za This test is not yet approved or  cleared  by the [place] FDA and has been authorized for detection and/or diagnosis of SARS-CoV-2 by FDA under an Emergency Use Authorization (EUA).  This EUA will remain in effect (meaning this test can be used) for the duration of the COVID-19 declaration under Section 564(b)(1) of the Act, 21 U.S.C. section 360bbb-3(b)(1), unless the authorization is terminated or revoked sooner. Performed at [facility], [place] [address] [place], [place] [address]   Urine culture     Status: Abnormal   Collection Time: 02/04/20  8:38 PM   Specimen: Urine, Random  Result Value Ref Range Status   Specimen Description URINE, RANDOM  Final   Special Requests   Final    NONE Performed at [facility], [place] [address], [place], [place] [address]    Culture >=100,000 COLONIES/mL ESCHERICHIA COLI (A)  Final   Report Status 02/07/2020 FINAL  Final   Organism ID, Bacteria ESCHERICHIA COLI (A)  Final      Susceptibility   Escherichia coli - MIC*    AMPICILLIN 4 SENSITIVE Sensitive     CEFAZOLIN <=4 SENSITIVE Sensitive     CEFTRIAXONE <=1 SENSITIVE Sensitive     CIPROFLOXACIN <=0.25 SENSITIVE Sensitive     GENTAMICIN <=1 SENSITIVE Sensitive     IMIPENEM <=0.25 SENSITIVE Sensitive     NITROFURANTOIN <=16 SENSITIVE Sensitive     TRIMETH/SULFA <=20 SENSITIVE Sensitive     AMPICILLIN/SULBACTAM <=2 SENSITIVE Sensitive     PIP/TAZO <=4 SENSITIVE Sensitive     * >=  100,000 COLONIES/mL ESCHERICHIA COLI  Blood culture (routine x 2)     Status: None (Preliminary result)   Collection Time: 02/04/20 11:40 PM   Specimen: BLOOD  Result Value Ref Range Status   Specimen Description BLOOD RIGHT ARM  Final   Special Requests   Final    BOTTLES DRAWN AEROBIC AND ANAEROBIC Blood Culture results may not be optimal due to an inadequate volume of blood received in culture bottles   Culture   Final    NO GROWTH 3 DAYS Performed at [facility], [place] [address] [place], [place] [address]    Report  Status PENDING  Incomplete  Blood culture (routine x 2)     Status: None (Preliminary result)   Collection Time: 02/04/20 11:40 PM   Specimen: BLOOD  Result Value Ref Range Status   Specimen Description BLOOD LEFT ARM  Final   Special Requests   Final    BOTTLES DRAWN AEROBIC AND ANAEROBIC Blood Culture adequate volume   Culture   Final    NO GROWTH 3 DAYS Performed at [facility], [place] [address], [place], [place] [address]    Report Status PENDING  Incomplete     Labs:   Basic Metabolic Panel: Recent Labs  Lab 02/04/20 1815 02/05/20 0054 02/05/20 0130 02/05/20 0256 02/06/20 0613 02/07/20 0430  NA 141  --  143 144 140 142  K 3.5  --  3.5 3.6 3.2* 3.7  CL 110  --   --  117* 111 112*  CO2 20*  --   --  18* 19* 21*  GLUCOSE 162*  --   --  87 151* 127*  BUN 14  --   --  11 7 6   CREATININE 1.16* 0.78  --  0.80 0.72 0.67  CALCIUM 8.8*  --   --  7.9* 9.3 9.1  MG  --   --   --   --   --  1.8   Liver Function Tests: Recent Labs  Lab 02/04/20 1815 02/05/20 0256 02/06/20 0613  AST 21 29 27   ALT 13 16 23   ALKPHOS 78 70 79  BILITOT 0.4 0.3 0.4  PROT 6.1* 5.2* 6.4*  ALBUMIN 3.2* 2.6* 3.0*   CBC: Recent Labs  Lab 02/04/20 1815 02/05/20 0130 02/05/20 0256 02/06/20 0613  WBC 5.1  --  4.3 3.1*  HGB 11.2* 9.2* 9.9* 11.3*  HCT 36.1 27.0* 32.3* 35.9*  MCV 95.8  --  99.1 95.2  PLT 156  --  126* 190    CBG: Recent Labs  Lab 02/04/20 1801  GLUCAP 142*     IMAGING STUDIES CT Head Wo Contrast  Result Date: 02/04/2020 CLINICAL DATA:  New onset seizure EXAM: CT HEAD WITHOUT CONTRAST TECHNIQUE: Contiguous axial images were obtained from the base of the skull through the vertex without intravenous contrast. COMPARISON:  None. FINDINGS: Brain: There is no acute intracranial hemorrhage, mass effect, or edema. Gray-white differentiation is preserved. There is no extra-axial fluid collection. Ventricles and sulci are within normal limits in size and configuration.  Vascular: No hyperdense vessel or unexpected calcification. Skull: Calvarium is unremarkable. Sinuses/Orbits: No acute finding. Other: None. IMPRESSION: No acute intracranial hemorrhage, mass effect, or evidence of acute infarction. Electronically Signed   By: Macy Mis M.D.   On: 02/04/2020 20:24   MR BRAIN W WO CONTRAST  Result Date: 02/05/2020 CLINICAL DATA:  Acute headache with normal neuro exam. Seizure-like activity EXAM: MRI HEAD WITHOUT AND WITH CONTRAST  TECHNIQUE: Multiplanar, multiecho pulse sequences of the brain and surrounding structures were obtained without and with intravenous contrast. CONTRAST:  6.39mL GADAVIST GADOBUTROL 1 MMOL/ML IV SOLN COMPARISON:  Head CT from yesterday FINDINGS: Brain: No infarction, hemorrhage, hydrocephalus, extra-axial collection or mass lesion. No gliosis or atrophy. No cortical finding to explain seizure. Vascular: Normal flow voids and vessel enhancement. Skull and upper cervical spine: Normal marrow signal. Sinuses/Orbits: Minor mucosal thickening in the inferior maxillary sinuses. IMPRESSION: Normal MRI of the brain. Electronically Signed   By: Monte Fantasia M.D.   On: 02/05/2020 06:17   DG Chest Portable 1 View  Result Date: 02/04/2020 CLINICAL DATA:  Seizure fever EXAM: PORTABLE CHEST 1 VIEW COMPARISON:  08/05/2019 FINDINGS: The heart size and mediastinal contours are within normal limits. Both lungs are clear. The visualized skeletal structures are unremarkable. IMPRESSION: No active disease. Electronically Signed   By: Donavan Foil M.D.   On: 02/04/2020 19:56   CT Renal Stone Study  Result Date: 02/04/2020 CLINICAL DATA:  Flank pain, history of lupus EXAM: CT ABDOMEN AND PELVIS WITHOUT CONTRAST TECHNIQUE: Multidetector CT imaging of the abdomen and pelvis was performed following the standard protocol without IV contrast. COMPARISON:  None. FINDINGS: Lower chest: No acute pleural or parenchymal lung disease. Hepatobiliary: No focal liver  abnormality is seen. No gallstones, gallbladder wall thickening, or biliary dilatation. Pancreas: Unremarkable. No pancreatic ductal dilatation or surrounding inflammatory changes. Spleen: Normal in size without focal abnormality. Adrenals/Urinary Tract: There is mild right-sided hydronephrosis without clear obstructing calculus. The left kidney is unremarkable. Bladder is moderately distended with no focal abnormality. The adrenals are unremarkable. Stomach/Bowel: No bowel obstruction or ileus. Normal appendix is seen in the right lower quadrant. No bowel wall thickening or inflammatory change. Postsurgical changes are seen from prior gastric surgery. Vascular/Lymphatic: Aortic atherosclerosis. Numerous phleboliths are seen within the pelvis. No enlarged abdominal or pelvic lymph nodes. Reproductive: Status post hysterectomy. No adnexal masses. Other: No abdominal wall hernia or abnormality. No abdominopelvic ascites. Musculoskeletal: No acute or destructive bony lesions. Reconstructed images demonstrate no additional findings. IMPRESSION: 1. Mild right-sided hydronephrosis without clear obstructing calculus. 2. Otherwise unremarkable unenhanced exam. Electronically Signed   By: Randa Ngo M.D.   On: 02/04/2020 22:44    DISCHARGE EXAMINATION: Vitals:   02/08/20 0155 02/08/20 0329 02/08/20 0515 02/08/20 0838  BP: 122/69  105/70 112/74  Pulse: 63  64 72  Resp: 16  17 14   Temp: 98.5 F (36.9 C)  98.3 F (36.8 C) 98.6 F (37 C)  TempSrc: Oral  Oral Oral  SpO2: 99%  99% 97%  Weight:  69.4 kg    Height:       General appearance: Awake alert.  In no distress Resp: Clear to auscultation bilaterally.  Normal effort Cardio: S1-S2 is normal regular.  No S3-S4.  No rubs murmurs or bruit GI: Abdomen is soft.  Nontender nondistended.  Bowel sounds are present normal.  No masses organomegaly    DISPOSITION: Home  Discharge Instructions    Call MD for:  difficulty breathing, headache or visual  disturbances   Complete by: As directed    Call MD for:  extreme fatigue   Complete by: As directed    Call MD for:  persistant dizziness or light-headedness   Complete by: As directed    Call MD for:  persistant nausea and vomiting   Complete by: As directed    Call MD for:  severe uncontrolled pain   Complete by: As directed  Call MD for:  temperature >100.4   Complete by: As directed    Diet general   Complete by: As directed    Discharge instructions   Complete by: As directed    Please take your medications as prescribed.  Please be sure to follow-up with your primary care provider.  Your PCP will need to either order another CT scan of your kidneys or an ultrasound of the kidneys in 4 to 6 weeks.  You were cared for by a hospitalist during your hospital stay. If you have any questions about your discharge medications or the care you received while you were in the hospital after you are discharged, you can call the unit and asked to speak with the hospitalist on call if the hospitalist that took care of you is not available. Once you are discharged, your primary care physician will handle any further medical issues. Please note that NO REFILLS for any discharge medications will be authorized once you are discharged, as it is imperative that you return to your primary care physician (or establish a relationship with a primary care physician if you do not have one) for your aftercare needs so that they can reassess your need for medications and monitor your lab values. If you do not have a primary care physician, you can call [phone] for a physician referral.   Increase activity slowly   Complete by: As directed         Allergies as of 02/08/2020   No Known Allergies     Medication List    TAKE these medications   azaTHIOprine 50 MG tablet Commonly known as: IMURAN Take 1 tablet (50 mg total) by mouth at bedtime. RESUME ON June 1ST 2021 Start taking on: February 11, 2020 What  changed:   additional instructions  These instructions start on February 11, 2020. If you are unsure what to do until then, ask your doctor or other care provider.   carbamazepine 200 MG tablet Commonly known as: TEGRETOL TAKE 1 TABLET BY MOUTH THREE TIMES A DAY   cephALEXin 500 MG capsule Commonly known as: KEFLEX Take 1 capsule (500 mg total) by mouth every 6 (six) hours for 7 days.   diclofenac Sodium 1 % Gel Commonly known as: VOLTAREN APPLY 4 G TOPICALLY 4 (FOUR) TIMES DAILY. What changed:   how much to take  how to take this  when to take this  additional instructions   gabapentin 300 MG capsule Commonly known as: NEURONTIN Take 1 capsule (300 mg total) by mouth 3 (three) times daily.   hydroxychloroquine 200 MG tablet Commonly known as: PLAQUENIL Take 200 mg by mouth See admin instructions. Take 1 tablet (200 mg total) by mouth 2 times daily. Do not take on saturdays and sundays   Misc. Devices Misc Right AFO brace. diagnosis: Right foot drag   predniSONE 10 MG tablet Commonly known as: DELTASONE Take 4 tablets once daily for 2 days, then take 2 tabs once daily for 2 days, then take 1 tab once daily for 2 days, then resume previous dosing. What changed: You were already taking a medication with the same name, and this prescription was added. Make sure you understand how and when to take each.   predniSONE 5 MG tablet Commonly known as: DELTASONE Take 1 tablet (5 mg total) by mouth daily with breakfast. Resume after you complete the prednisone taper. Start taking on: February 14, 2020 What changed:   additional instructions  These instructions start on  February 14, 2020. If you are unsure what to do until then, ask your doctor or other care provider.   tiZANidine 4 MG tablet Commonly known as: ZANAFLEX Take 1 tablet (4 mg total) by mouth every 8 (eight) hours as needed for muscle spasms.   topiramate 50 MG tablet Commonly known as: TOPAMAX Take 1 tablet (50 mg total)  by mouth 2 (two) times daily.   VITAMIN D3 PO Take 1 capsule by mouth daily.        Follow-up Information    [facility] up.   Specialty: Rehabilitation Why: resume outpatient physical therapy Contact information: [address] [place] [id number] [place] [id number] [phone]       Nicolette Bang, DO. Schedule an appointment as soon as possible for a visit in 1 week(s).   Specialty: Family Medicine Contact information: [place] [address] [phone]           TOTAL DISCHARGE TIME: 57 minutes  [place]  [facility] Pager on [email or web address]  02/08/2020, 1:52 PM

## 2020-02-10 LAB — CULTURE, BLOOD (ROUTINE X 2)
Culture: NO GROWTH
Culture: NO GROWTH
Special Requests: ADEQUATE

## 2020-02-11 ENCOUNTER — Telehealth: Payer: Self-pay

## 2020-02-11 ENCOUNTER — Ambulatory Visit: Payer: Medicare Other | Admitting: Physical Therapy

## 2020-02-11 NOTE — Telephone Encounter (Signed)
Transition Care Management Follow-up Telephone Call Date of discharge and from where: 02/08/2020 [facility] Called pt unable to reach/ Left voice message to call back. Name and phone nr provided.

## 2020-02-14 ENCOUNTER — Ambulatory Visit: Payer: Medicare Other | Admitting: Physical Therapy

## 2020-02-18 ENCOUNTER — Ambulatory Visit: Payer: Medicare Other | Admitting: Gastroenterology

## 2020-03-23 ENCOUNTER — Other Ambulatory Visit: Payer: Self-pay | Admitting: Internal Medicine

## 2020-03-23 DIAGNOSIS — G44091 Other trigeminal autonomic cephalgias (TAC), intractable: Secondary | ICD-10-CM

## 2020-03-23 DIAGNOSIS — M5412 Radiculopathy, cervical region: Secondary | ICD-10-CM

## 2020-03-23 NOTE — Telephone Encounter (Signed)
1) Medication(s) Requested (by name): azaTHIOprine (IMURAN) 50 MG tablet carbamazepine (TEGRETOL) 200 MG tablet gabapentin (NEURONTIN) 300 MG capsule hydroxychloroquine (PLAQUENIL) 200 MG tablet tiZANidine (ZANAFLEX) 4 MG tablet  topiramate (TOPAMAX) 50 MG tablet    2) Pharmacy of Choice: [facility]  [address], [place], [place] [address]   [facility]  3) Special Requests: Patient is out of medication    Approved medications will be sent to the pharmacy, we will reach out if there is an issue.  Requests made after 3pm may not be addressed until the following business day!  If a patient is unsure of the name of the medication(s) please note and ask patient to call back when they are able to provide all info, do not send to responsible party until all information is available!

## 2020-03-24 MED ORDER — GABAPENTIN 300 MG PO CAPS: 300.0000 mg | ORAL_CAPSULE | Freq: Three times a day (TID) | ORAL | 0 refills | Status: AC

## 2020-03-24 MED ORDER — TIZANIDINE HCL 4 MG PO TABS: 4.0000 mg | ORAL_TABLET | Freq: Three times a day (TID) | ORAL | 0 refills | Status: AC | PRN

## 2020-03-24 MED ORDER — TOPIRAMATE 50 MG PO TABS: 50.0000 mg | ORAL_TABLET | Freq: Two times a day (BID) | ORAL | 0 refills | Status: AC

## 2020-03-24 NOTE — Telephone Encounter (Signed)
Dr. Juleen China does not prescribe the Imuran, Tegretol or Plaquenil. Patient will need to contact her Neurologist for Imuran & Tegretol refills and her Rheumatologist for the Plaquenil refill.  Will route the remaining 3 Rxes to PCP to see if she is able to prescribe this to an out of state pharmacy.

## 2020-03-24 NOTE — Telephone Encounter (Signed)
Called patient and informed her that she needs to contact her Neurologist for Imuran and Tegretol. Patient was also informed she needs to contact her Rheumatologist for the Panquenil. Patient understood.

## 2020-04-17 ENCOUNTER — Other Ambulatory Visit: Payer: Self-pay | Admitting: Neurology

## 2020-09-18 ENCOUNTER — Encounter: Payer: Self-pay | Admitting: Physical Therapy

## 2020-09-18 NOTE — Therapy (Signed)
[facility] [address] [place], [place], [address] Phone: [phone]   Fax:  [phone]  Patient Details  Name: Brittney Maddox MRN: [id number] Date of Birth: 10-30-1965 Referring Provider:  No ref. provider found  Encounter Date: 09/18/2020   PHYSICAL THERAPY DISCHARGE SUMMARY  Visits from Start of Care: 17 (last visit 01/08/2020)  Current functional level related to goals / functional outcomes: See PT noted; LTGs were updated, but then pt was hospitalized and did not return to OPPT.   Remaining deficits: See eval/PT notes   Education / Equipment: See notes  Plan: Patient agrees to discharge.  Patient goals were not met. Patient is being discharged due to a change in medical status.  ?????      Tramell Piechota W. 09/18/2020, 2:04 PM [place] [address] [facility] [address] [place], [place], [address] Phone: [phone]   Fax:  [phone]

## 2021-04-08 IMAGING — DX DG CHEST 2V
2 series · 2 of 2 positions shown · non-contrast
Comparison: 07/16/2019

CLINICAL DATA: Fever

EXAM:
CHEST - 2 VIEW

[chest pa]
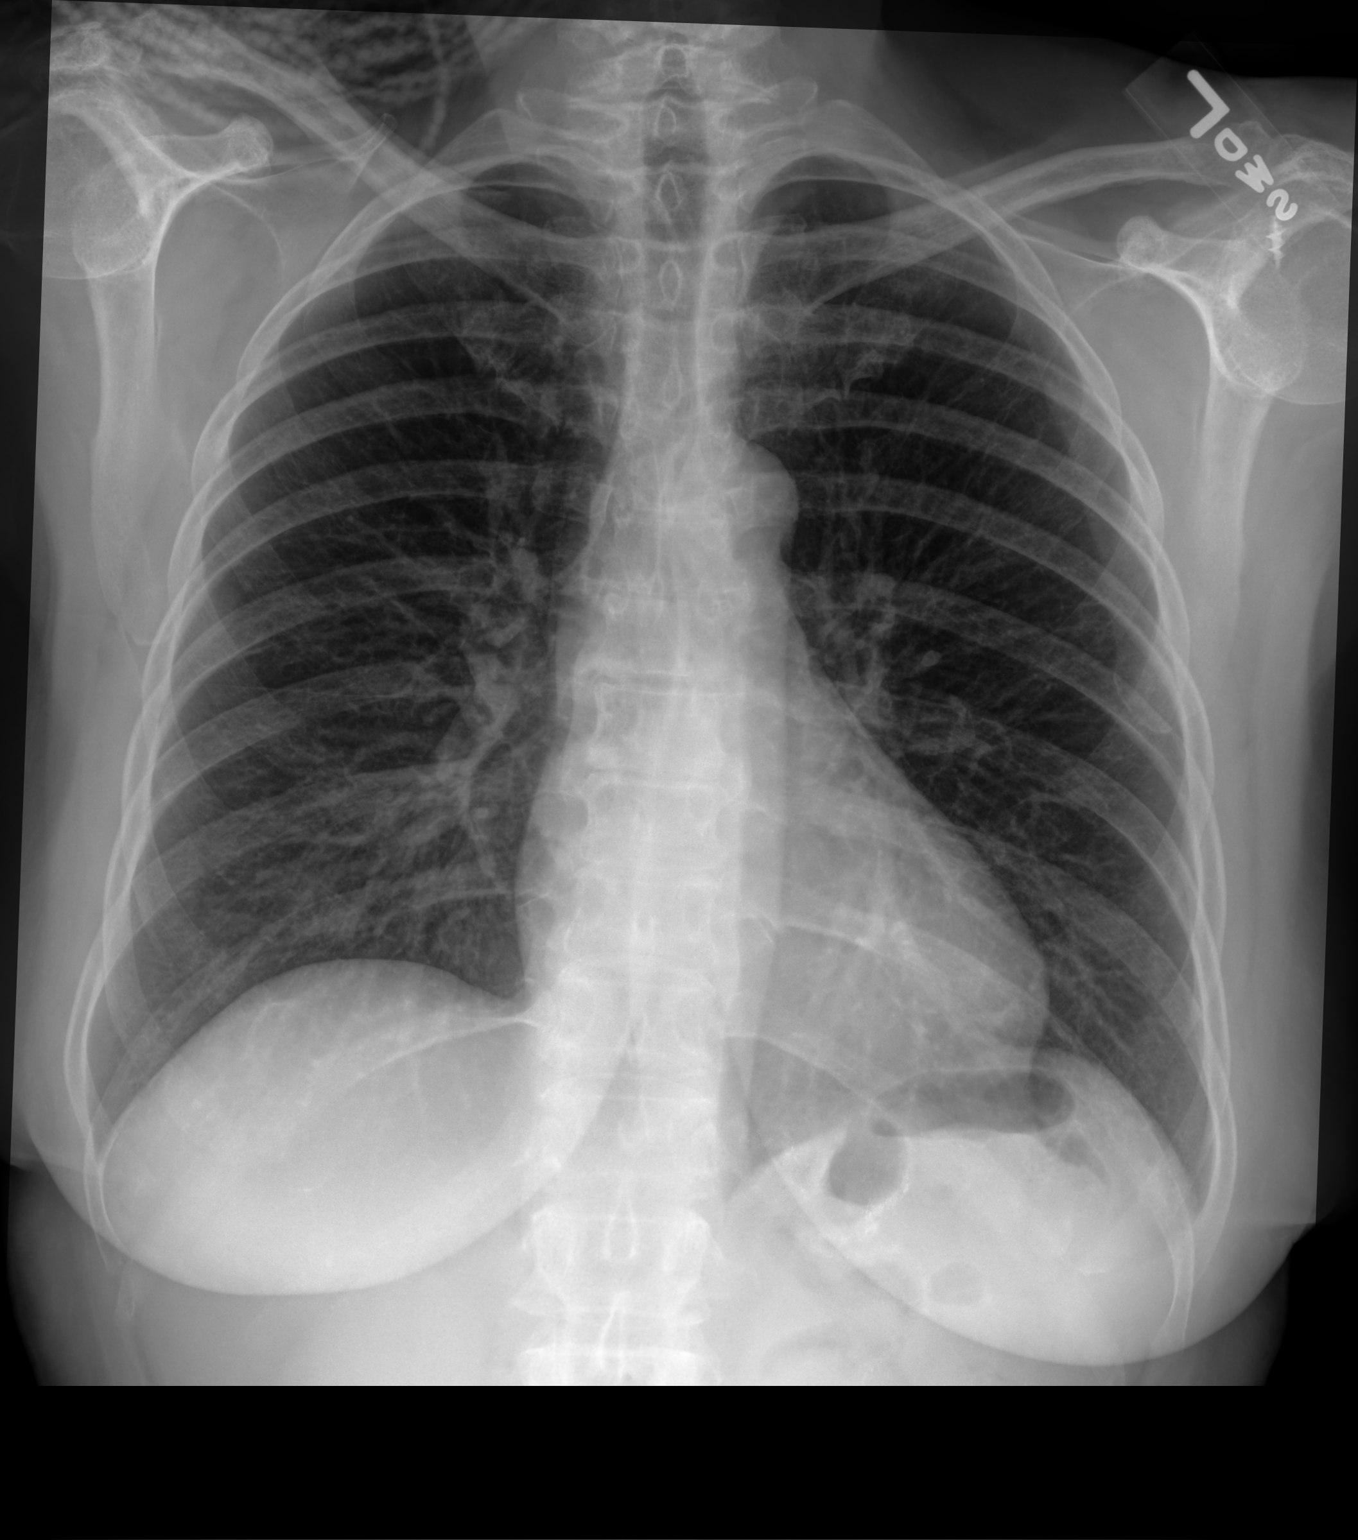

[chest lat]
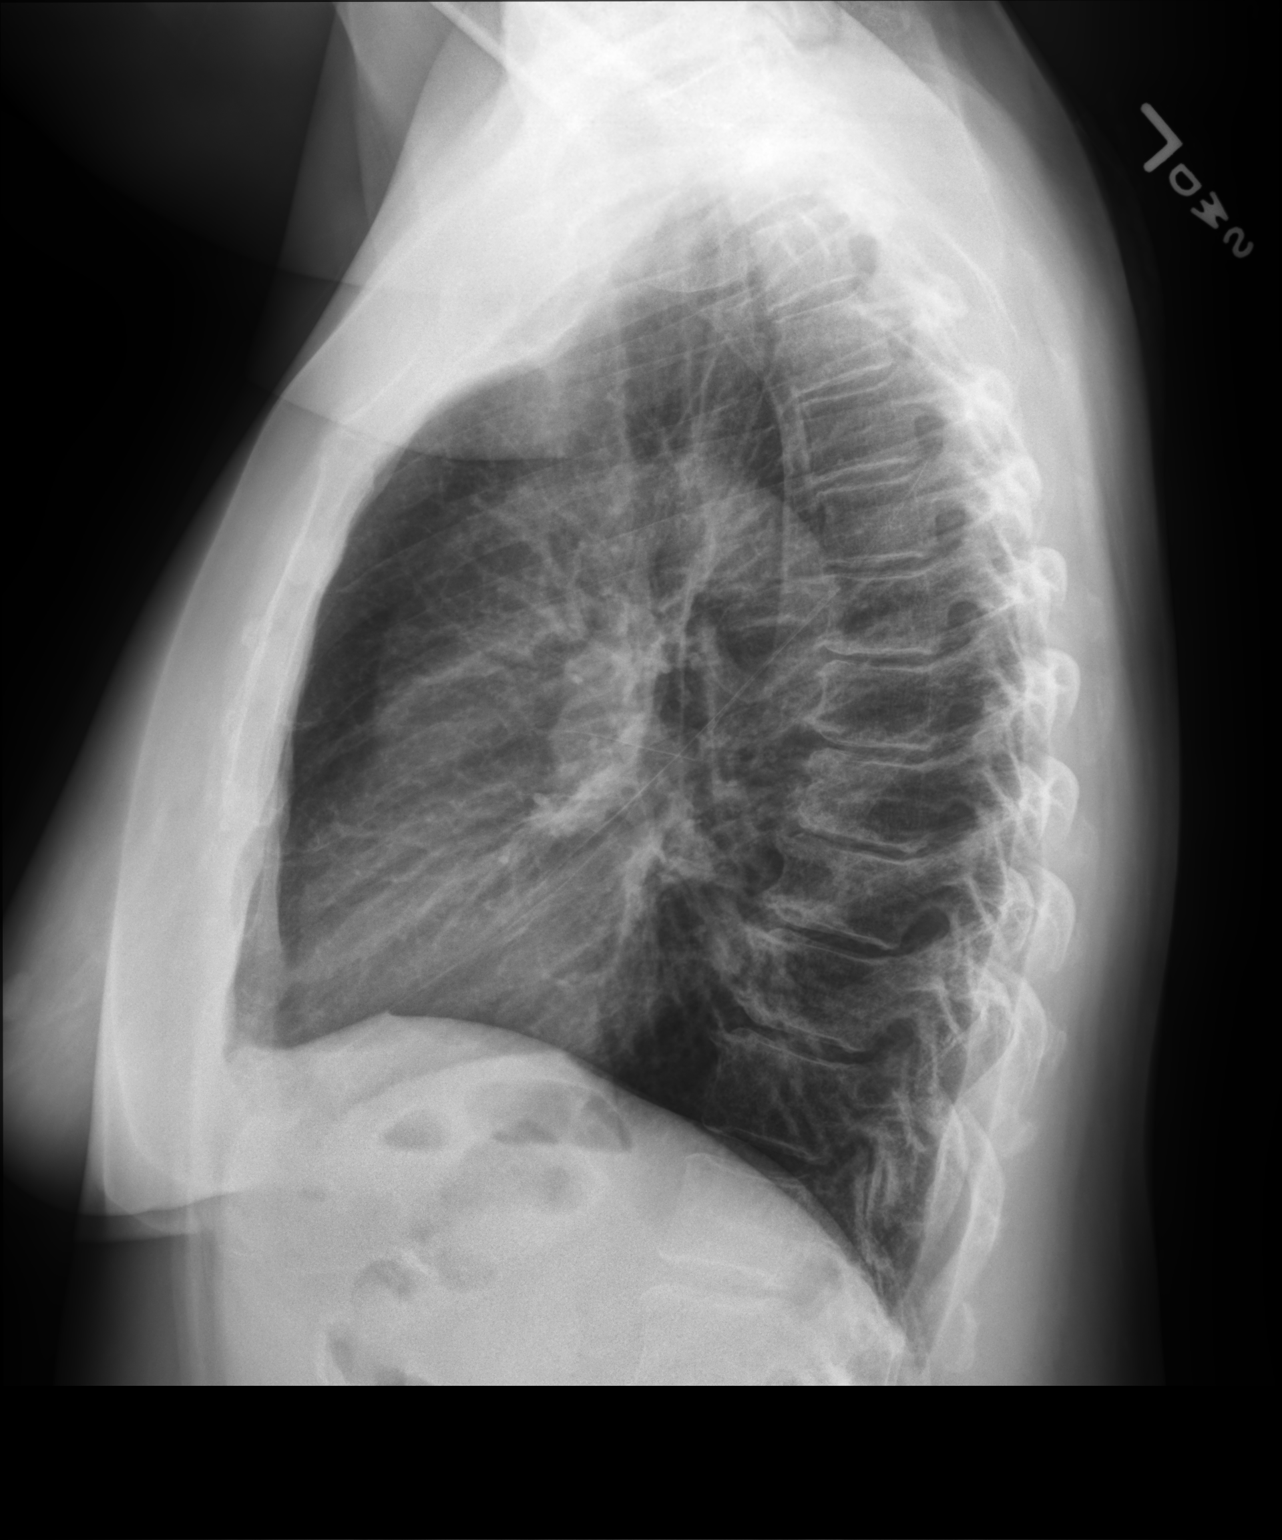

[2 of 2 positions shown; findings below may reference images not displayed]

FINDINGS: The heart size and mediastinal contours are within normal limits.
Both lungs are clear. Disc degenerative disease of the thoracic
spine.
IMPRESSION: No acute abnormality of the lungs.

## 2021-08-19 IMAGING — MG DIGITAL SCREENING BILAT W/ CAD
4 series · 4 of 4 positions shown · non-contrast
Comparison: Previous exam(s).

CLINICAL DATA: Screening.

EXAM:
DIGITAL SCREENING BILATERAL MAMMOGRAM WITH CAD

[R CC]
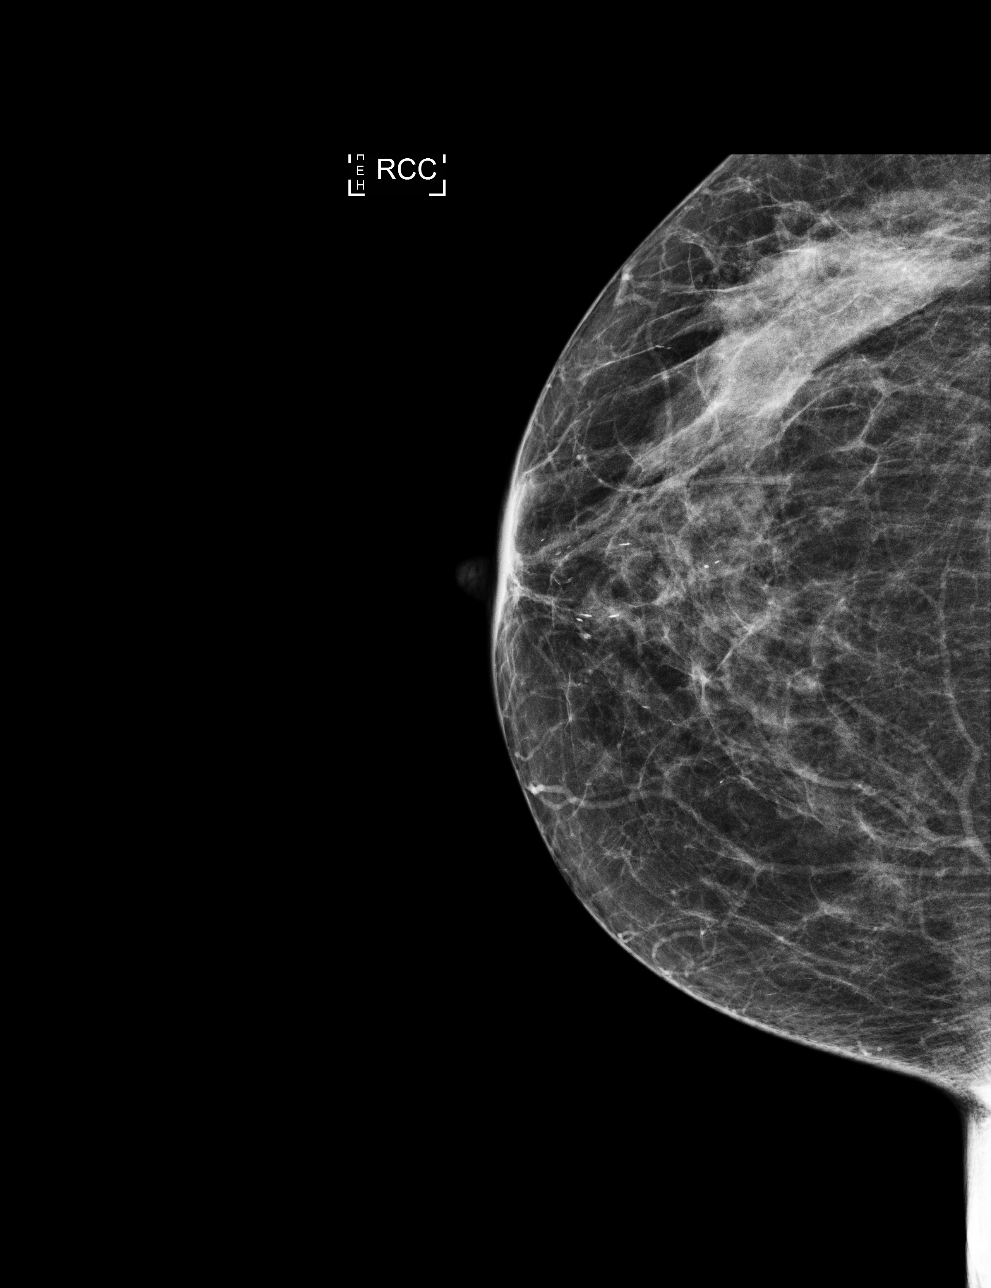

[R MLO]
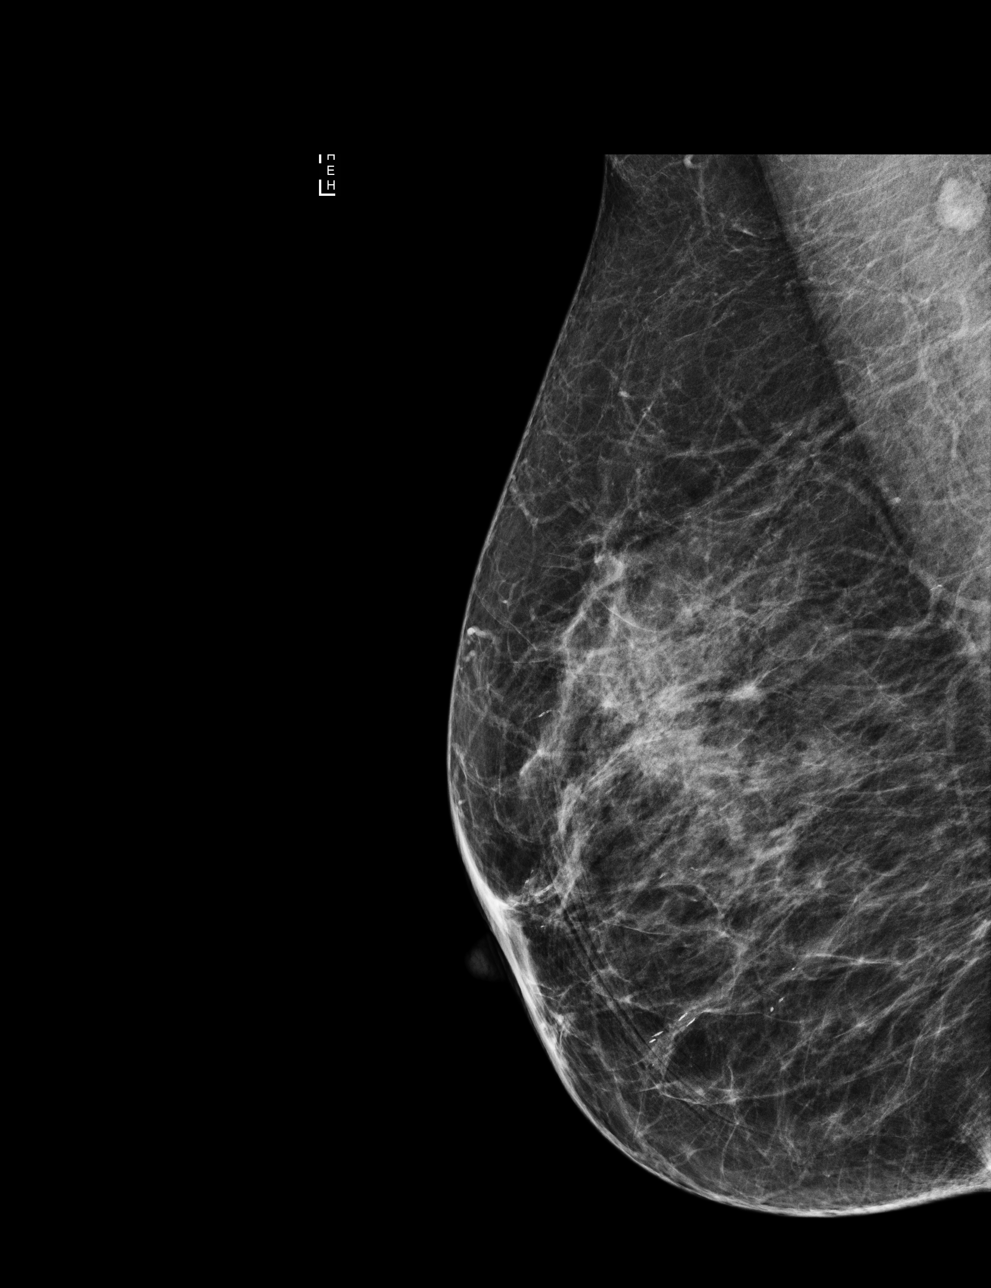

[L MLO]
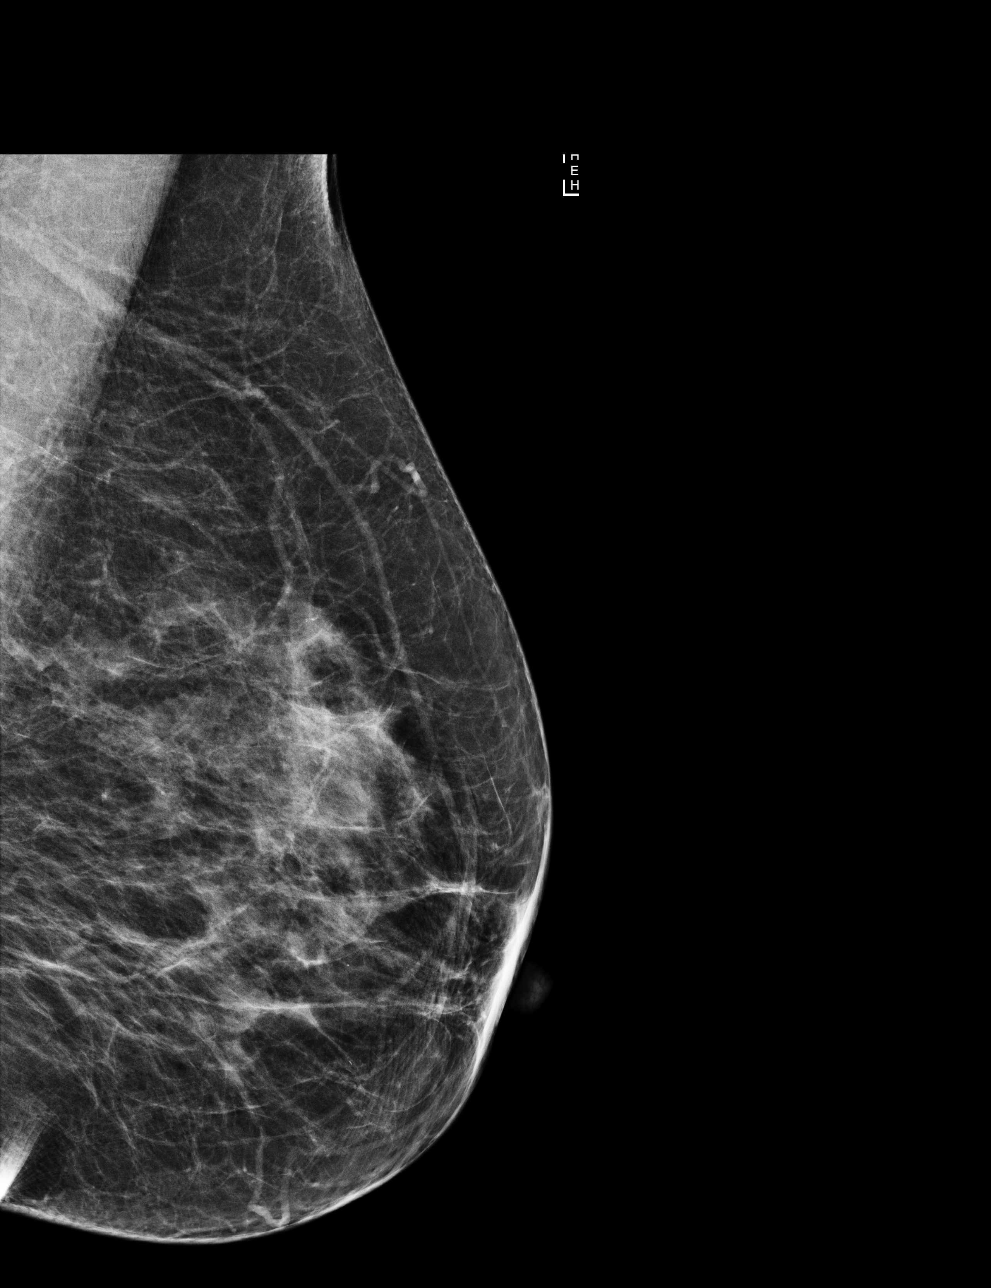

[L CC]
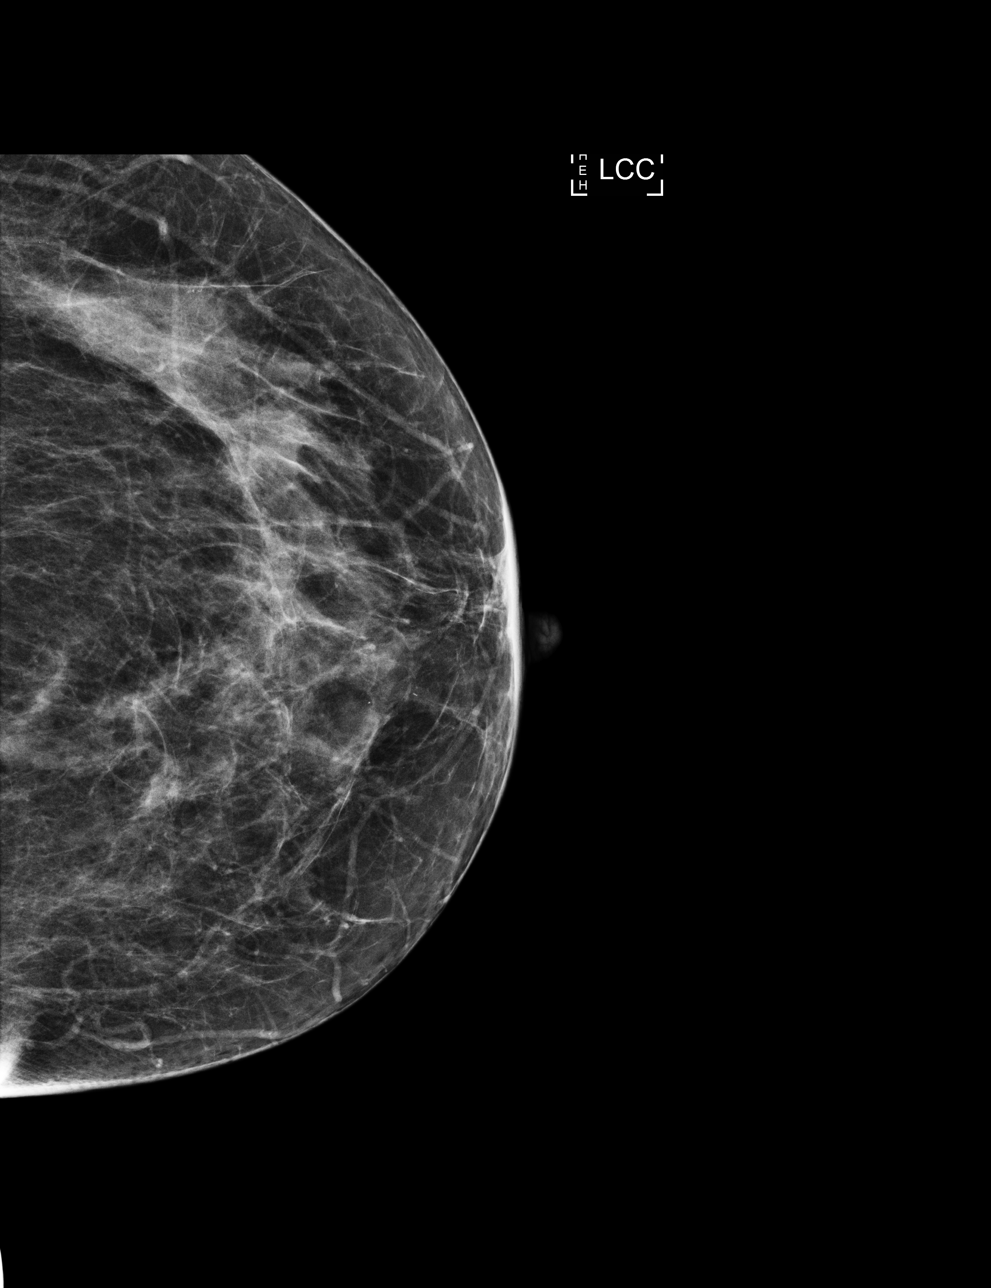

[4 of 4 positions shown; findings below may reference images not displayed]

ACR Breast Density Category c: The breast tissue is heterogeneously
dense, which may obscure small masses.
FINDINGS: In the right breast, a possible asymmetry warrants further
evaluation. In the left breast, no findings suspicious for
malignancy. Images were processed with CAD.
IMPRESSION: Further evaluation is suggested for possible asymmetry in the right
breast.

RECOMMENDATION:
Diagnostic mammogram and possibly ultrasound of the right breast.
(Code:[id number])

The patient will be contacted regarding the findings, and additional
imaging will be scheduled.

BI-RADS CATEGORY  0: Incomplete. Need additional imaging evaluation
and/or prior mammograms for comparison.

## 2022-11-02 ENCOUNTER — Encounter: Payer: Self-pay | Admitting: Gastroenterology
